# Patient Record
Sex: Female | Born: 1955 | Race: White | Hispanic: No | Marital: Married | Smoking: Former smoker
Health system: Southern US, Community
[De-identification: ages and names within clinical notes are randomized; demographics above are authoritative.]

## PROBLEM LIST (undated history)

## (undated) DIAGNOSIS — F32A Depression, unspecified: Secondary | ICD-10-CM

## (undated) DIAGNOSIS — M1990 Unspecified osteoarthritis, unspecified site: Secondary | ICD-10-CM

## (undated) DIAGNOSIS — Z8719 Personal history of other diseases of the digestive system: Secondary | ICD-10-CM

## (undated) DIAGNOSIS — R112 Nausea with vomiting, unspecified: Secondary | ICD-10-CM

## (undated) DIAGNOSIS — M797 Fibromyalgia: Secondary | ICD-10-CM

## (undated) DIAGNOSIS — H269 Unspecified cataract: Secondary | ICD-10-CM

## (undated) DIAGNOSIS — E079 Disorder of thyroid, unspecified: Secondary | ICD-10-CM

## (undated) DIAGNOSIS — Z9889 Other specified postprocedural states: Secondary | ICD-10-CM

## (undated) DIAGNOSIS — C3490 Malignant neoplasm of unspecified part of unspecified bronchus or lung: Secondary | ICD-10-CM

## (undated) DIAGNOSIS — F419 Anxiety disorder, unspecified: Secondary | ICD-10-CM

## (undated) DIAGNOSIS — E039 Hypothyroidism, unspecified: Secondary | ICD-10-CM

## (undated) DIAGNOSIS — E785 Hyperlipidemia, unspecified: Secondary | ICD-10-CM

## (undated) DIAGNOSIS — K219 Gastro-esophageal reflux disease without esophagitis: Secondary | ICD-10-CM

## (undated) DIAGNOSIS — F329 Major depressive disorder, single episode, unspecified: Secondary | ICD-10-CM

## (undated) HISTORY — DX: Unspecified osteoarthritis, unspecified site: M19.90

## (undated) HISTORY — DX: Unspecified cataract: H26.9

## (undated) HISTORY — PX: KNEE SURGERY: SHX244

## (undated) HISTORY — DX: Fibromyalgia: M79.7

## (undated) HISTORY — DX: Disorder of thyroid, unspecified: E07.9

## (undated) HISTORY — DX: Major depressive disorder, single episode, unspecified: F32.9

## (undated) HISTORY — DX: Anxiety disorder, unspecified: F41.9

## (undated) HISTORY — PX: VAGINAL HYSTERECTOMY: SUR661

## (undated) HISTORY — DX: Depression, unspecified: F32.A

## (undated) HISTORY — DX: Malignant neoplasm of unspecified part of unspecified bronchus or lung: C34.90

## (undated) HISTORY — PX: CATARACT EXTRACTION: SUR2

## (undated) HISTORY — PX: TUBAL LIGATION: SHX77

## (undated) HISTORY — DX: Hyperlipidemia, unspecified: E78.5

## (undated) HISTORY — PX: EYE SURGERY: SHX253

## (undated) HISTORY — PX: OTHER SURGICAL HISTORY: SHX169

## (undated) HISTORY — PX: COLONOSCOPY: SHX174

## (undated) HISTORY — DX: Gastro-esophageal reflux disease without esophagitis: K21.9

## (undated) HISTORY — PX: FOOT FRACTURE SURGERY: SHX645

---

## 1999-03-08 ENCOUNTER — Ambulatory Visit (HOSPITAL_COMMUNITY): Admission: RE | Admit: 1999-03-08 | Discharge: 1999-03-08 | Payer: Self-pay | Admitting: Obstetrics and Gynecology

## 1999-03-08 ENCOUNTER — Encounter (INDEPENDENT_AMBULATORY_CARE_PROVIDER_SITE_OTHER): Payer: Self-pay

## 1999-08-01 ENCOUNTER — Other Ambulatory Visit: Admission: RE | Admit: 1999-08-01 | Discharge: 1999-08-01 | Payer: Self-pay | Admitting: Obstetrics and Gynecology

## 2006-03-20 ENCOUNTER — Ambulatory Visit: Payer: Self-pay | Admitting: Gastroenterology

## 2006-04-11 ENCOUNTER — Encounter: Payer: Self-pay | Admitting: Gastroenterology

## 2006-04-11 ENCOUNTER — Ambulatory Visit: Payer: Self-pay | Admitting: Gastroenterology

## 2006-05-03 ENCOUNTER — Ambulatory Visit: Payer: Self-pay | Admitting: Gastroenterology

## 2006-05-03 ENCOUNTER — Ambulatory Visit: Payer: Self-pay | Admitting: Internal Medicine

## 2006-05-03 LAB — CONVERTED CEMR LAB
ALT: 11 units/L (ref 0–40)
AST: 13 units/L (ref 0–37)
Albumin: 3.8 g/dL (ref 3.5–5.2)
Alkaline Phosphatase: 71 units/L (ref 39–117)
BUN: 9 mg/dL (ref 6–23)
Basophils Absolute: 0.1 10*3/uL (ref 0.0–0.1)
Basophils Relative: 0.9 % (ref 0.0–1.0)
Bilirubin, Direct: 0.1 mg/dL (ref 0.0–0.3)
CO2: 30 meq/L (ref 19–32)
Calcium: 9.3 mg/dL (ref 8.4–10.5)
Chloride: 103 meq/L (ref 96–112)
Creatinine, Ser: 0.7 mg/dL (ref 0.4–1.2)
Eosinophils Absolute: 0 10*3/uL (ref 0.0–0.6)
Eosinophils Relative: 0 % (ref 0.0–5.0)
GFR calc Af Amer: 113 mL/min
GFR calc non Af Amer: 94 mL/min
Glucose, Bld: 106 mg/dL — ABNORMAL HIGH (ref 70–99)
HCT: 39.3 % (ref 36.0–46.0)
Hemoglobin: 13.8 g/dL (ref 12.0–15.0)
INR: 1.3 (ref 0.9–2.0)
Lymphocytes Relative: 19.2 % (ref 12.0–46.0)
MCHC: 35.2 g/dL (ref 30.0–36.0)
MCV: 87.8 fL (ref 78.0–100.0)
Monocytes Absolute: 0.3 10*3/uL (ref 0.2–0.7)
Monocytes Relative: 2.7 % — ABNORMAL LOW (ref 3.0–11.0)
Neutro Abs: 8.5 10*3/uL — ABNORMAL HIGH (ref 1.4–7.7)
Neutrophils Relative %: 77.2 % — ABNORMAL HIGH (ref 43.0–77.0)
Platelets: 339 10*3/uL (ref 150–400)
Potassium: 4.6 meq/L (ref 3.5–5.1)
Prothrombin Time: 13.6 s (ref 10.0–14.0)
RBC: 4.48 M/uL (ref 3.87–5.11)
RDW: 12.4 % (ref 11.5–14.6)
Sed Rate: 20 mm/hr (ref 0–25)
Sodium: 139 meq/L (ref 135–145)
TSH: 2.83 microintl units/mL (ref 0.35–5.50)
Total Bilirubin: 0.5 mg/dL (ref 0.3–1.2)
Total Protein: 6.9 g/dL (ref 6.0–8.3)
WBC: 11 10*3/uL — ABNORMAL HIGH (ref 4.5–10.5)

## 2006-05-10 ENCOUNTER — Ambulatory Visit: Payer: Self-pay | Admitting: Gastroenterology

## 2006-08-15 ENCOUNTER — Ambulatory Visit: Payer: Self-pay | Admitting: Internal Medicine

## 2006-08-20 ENCOUNTER — Ambulatory Visit: Payer: Self-pay | Admitting: Cardiology

## 2010-05-31 NOTE — Assessment & Plan Note (Signed)
[facility]                             PULMONARY OFFICE NOTE   NAME:[name], Heather LAMARCA                         MRN:          [id number]  DATE:08/15/2006                            DOB:          [birth year]    PULMONARY NEW PATIENT EVALUATION:  This is a 55 year old white female  with an incidentally-noted right middle lobe nodule on work-up for  ischemic colitis done here by Dr. Jarold Motto and referred back to her  primary physician, Dr. Leonette Monarch, for follow-up CT scan this month.  She  elected to have this followed here.  She denies any pleuritic pain,  fevers, chills, sweats, orthopnea, PND, leg swelling, unintended weight  loss, dyspnea or history of cough or hemoptysis.   PAST MEDICAL HISTORY:  1. Significant for ischemic colitis.  2. Negative for rheumatologic diseases.  3. She is status post status post hysterectomy.  4. Status post tubal ligation.  5. Has never had a diagnosis of malignancy made.   ALLERGIES:  None known.   SOCIAL HISTORY:  She is still smoking a pack per day.  Works as a  Museum/gallery exhibitions officer.   FAMILY HISTORY:  Positive for emphysema in her father and colon cancer  in her mother.  Also, leukemia in a sister.   REVIEW OF SYSTEMS:  Taken in detail on the worksheet and negative,  except as outlined above.   PHYSICAL EXAMINATION:  GENERAL:  This is a pleasant, ambulatory white  female in no acute distress.  VITAL SIGNS:  She is afebrile with stable vital signs.  HEENT:  Unremarkable.  Oropharynx clear.  LUNGS:  Lung fields completely clear bilaterally to auscultation and  percussion.  HEART:  Regular rate and rhythm without murmur, gallop, or rub.  EXTREMITIES:  Warm without calf tenderness.  No clubbing, cyanosis, or  edema.   CT scan of the abdomen was the only study available from May 03, 2006  and shows a 6 mm pulmonary nodule in the right middle lobe.  No previous  x-rays are available, and she does not think any have  ever been done.   IMPRESSION:  Asymptomatic microscopic nodule (less than 8 mm; therefore,  would not show up on a PET scan) involving the right middle lobe in this  patient who is an active smoker.  The right middle lobe distribution  favors benignancy, but I told the patient that there was no way to be  sure because we do not have baseline studies.  I therefore agreed with  doing a CT scan now and then probably again at 6 months to make sure  there is no growth in this nodule.  I did warn her that a CT scan of the  chest may show additional nodules using the concept of slices of bread  where the thinner the slice is, the more likely imperfections in the  loaf will be detected if we look hard enough.  Therefore, it is critical  that she not rely on technology to reduce her chances of lung cancer,  because there is no evidence that the early detection  is helpful  statistically, but that stopping smoking would definitely show benefit  in terms of long-term risk.   She was considering committing to quit, and therefore I gave her a  Chantix starter pack, but referred her to Dr. Leonette Monarch for refills.     Charlaine Dalton. Sherene Sires, MD, Heather [name]  Electronically Signed    MBW/MedQ  DD: 08/15/2006  DT: 08/16/2006  Job #: [id number]   cc:   Dr. Leonette Monarch

## 2010-06-03 NOTE — Assessment & Plan Note (Signed)
[facility]                         GASTROENTEROLOGY OFFICE NOTE   NAME:Heather [name], Heather [name]                         MRN:          [id number]  DATE:03/20/2006                            DOB:          11/24/1955    Mrs. Heather [name] is a 55 year old white female, self referred for evaluation  of rectal pain.   This patient does have a previous history of thrombosed hemorrhoids.  She has had lancing of hemorrhoids, also has recurrent rectal fissures.  Over the last 6 months, she has had periodic, severe pain in her rectum  that she describes as a throbbing pain, high up in her rectum.  There  has been no real serious rectal bleeding, but she does occasionally see  some bright red blood.  She has a self-diagnosed irritable bowel  syndrome of alternating diarrhea and constipation, and does use a lot of  sorbitol in her diet in the form of chewing gum.  She denies upper GI or  hepatobiliary complaints.  She did have flexible sigmoidoscopy in 1989;  this report is not available.  She, otherwise, follows a regular diet,  and denies any specific food intolerances, and has had no anorexia or  weight loss.   PAST MEDICAL HISTORY:  Remarkable for depression and chronic thyroid  dysfunction.  She is currently on Effexor XR 75 mg a day, L-thyroxin 100  mg a day, and Premarin 0.625 mg a day.   Denies allergies.   SURGICAL PROCEDURES:  The patient has had a hysterectomy and tubal  ligation.   FAMILY HISTORY:  Remarkable for mother dying at age 56 of colon cancer.  She has a brother with inflammatory bowel disease.   SOCIAL HISTORY:  Patient is married and lives with her husband.  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  She smokes at least  a pack of cigarettes a day, but denies ethanol abuse.   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  She denies any current  cardiovascular, pulmonary, or neurological problems.   PHYSICAL EXAMINATION:  Shows her to be healthy-appearing white female in  no acute distress, appearing her stated age.  She is 5 feet 4 inches tall and weighs 188 pounds.  Blood pressure is  146/82 and pulse is 68 and regular.  I could not appreciate stigmata of chronic liver disease.  Her chest was clear.  She appeared to be a in regular rhythm without murmurs, gallops, or  rubs.  I could not appreciate hepatosplenomegaly, abdominal masses or  tenderness.  Bowel sounds were normal.  Her extremities were unremarkable.  MENTAL STATUS:  Clear.  RECTAL EXAM:  Showed a rather deep posterior fissure without any  definite hemorrhoids.  Rectal exam showed no masses or tenderness with  soft stool which is guaiac negative.   ASSESSMENT:  1. 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  2. Chronic rectal fissure.  3. Family history of colon carcinoma and inflammatory bowel disease.  4. History chronic thyroid dysfunction.  5. History of chronic anxiety, depression, and panic disorders.      Patient is on Effexor therapy.   RECOMMENDATIONS:  1. High fiber diet with daily Benefiber and liberal p.o. fluids.  2. Twice a day sitz bath with diltiazem gel locally.  3. Sublingual NuLev p.r.n. as needed for rectal spasms.  4. Outpatient colonoscopy in several weeks' time.  Check screening      laboratory parameters.  5. Consider surgical referral to Dr. Kendrick Ranch.     Vania Rea. Jarold Motto, MD, Caleen Essex, FAGA  Electronically Signed    DRP/MedQ  DD: 03/20/2006  DT: 03/20/2006  Job #: [id number]   cc:   Sheppard Plumber. Earlene Plater, M.D.

## 2010-06-03 NOTE — Op Note (Signed)
[facility]  Patient:    Heather [name], Heather [name]                         MRN: [id number] Proc. Date: 03/08/99 Adm. Date:  [id number] Attending:  Amanda Cockayne                           Operative Report  PREOPERATIVE DIAGNOSIS:       Prolonged irregular uterine bleeding, heavy x 4 days with clots.  POSTOPERATIVE DIAGNOSIS:      Prolonged irregular uterine bleeding, heavy x 4 days with clots.  Endometrial polyp.  OPERATION:  SURGEON:                      Esmeralda Arthur, M.D.  ASSISTANT:  ANESTHESIA:                   General anesthesia.  MEDIUM:                       Sorbitol, deficit 40 cc.  PACKS:                        None.  CATHETERS:                    None.  FINDINGS:                     The uterus felt slightly enlarged bimanual.  ESTIMATED BLOOD LOSS:  DESCRIPTION OF PROCEDURE:     The cervix and uterus sounded to 9 cm.  The cervix was then dilated to a #23.  I could not get the observation scope in.  I dilated her to a #25 with the Hegars.  The scope was inserted and there was a lot of clotted blood in the uterine cavity.  It looked like a polyp anteriorly and questionable polyp to the lateral wall.  I did not see anything new resecting and we explored with the Randall stonegrasping forceps.  A small amount of tissue was obtained.  We then curetted.  I looked again and I think we removed what we saw. The procedure was terminated.  The patient was carried to the recovery room in ood condition.  It should be noted that the patient was prepped and draped in a sterile field and the bladder was catheterized. DD:  03/08/99 TD:  03/08/99 Job: [id number]

## 2010-06-03 NOTE — Assessment & Plan Note (Signed)
HEALTHCARE                         GASTROENTEROLOGY OFFICE NOTE   NAME:Sieben, [name]                         MRN:          [id number]  DATE:05/03/2006                            DOB:          16-Dec-1955    PROBLEM:  Acute abdominal pain and rectal bleeding.   HISTORY:  Heather [name] is a pleasant 55 year old white female who was seen  earlier today by Dr. Jarold Motto with complaints of onset of acute  abdominal pain, which woke her from sleep on Tuesday May 01, 2006.  This was associated with diaphoresis, then diarrhea, followed by rectal  blood.  She also had dry heaves.  She had not had any fevers or chills  since.  She continued to pass some blood per rectum for the remainder of  Tuesday and has had persistent pain ever since.  She says she also is  having a lot of urgency.  She did pass some bloody stools yesterday and  has not passed any blood today.  She generally feels poorly and has not  eaten.  She was set up for CT scan of her abdomen and pelvis to be done  stat today, and this has returned this afternoon.  The patient was  brought back to the office from radiology to discuss findings.  She was  found to have a segmental colitis of the descending colon from the  splenic flexure to the sigmoid colon with some pericolonic fat  stranding.  This was felt to be consistent with an ischemic colitis.  She was also noted to have a 6.3 mm pulmonary nodule in the right middle  lobe.  Her labs from this morning show a  WBC of 11, hemoglobin 13.8,  hematocrit 39.3, Pro Time 13.6, INR 1.3, sedimentation rate 20.  Electrolytes were within normal limits.  Liver function studies also  normal.   EXAM:  Well-developed white female uncomfortable in no acute distress.  ABDOMEN:  Soft.  She is tender along the left abdomen, and particularly  along the left lower quadrant.  There is guarding but no rebound.  Bowel  sounds are active.   IMPRESSION:  55. A 55 year old  white female with acute segmental ischemic colitis.      Her primary risk factor is Premarin, which may ultimately need to      be discontinued.  She is hemodynamically stable and feels that she      can manage her pain at home.  We will ask her to rest at home over      the next few days.  She is given the note to stay out of work      through May 08, 2006.  Clear liquid diet over the next 24 hours      and then progress to full liquids and then soft thereafter as she      tolerates.  She is to push fluids.  2. Vicodin 5/500 one every 4 to 6 hours as needed for pain.  3. Continued Librax 1 p.o. t.i.d. to q.i.d.  4. Phenergan 25 mg q.6h p.r.n. nausea.  5. Follow up  with Dr. Jarold Motto in 1 week as scheduled.  6. The patient is advised to call back tomorrow and, if she does not      feel that she is improving      or feels that she cannot manage at home or feels that she is      deteriorating, may require admission to the hospital for supportive      management.      Mike Gip, PA-C  Electronically Signed      Vania Rea. Jarold Motto, MD, Caleen Essex, FAGA  Electronically Signed   AE/MedQ  DD: 05/03/2006  DT: 05/04/2006  Job #: [id number]

## 2010-06-03 NOTE — Assessment & Plan Note (Signed)
[facility]                         GASTROENTEROLOGY OFFICE NOTE   NAME:Heather [name]                         MRN:          [id number]  DATE:05/10/2006                            DOB:          10-18-1955    PROBLEM:  Followup segmental ischemic colitis.   HISTORY:  Heather [name] comes in today 1 week after her diagnosis of segmental  ischemic colitis. She says she is feeling much better, continues to  experience some mild lower abnormal cramping but has not been having any  ongoing pain and is not requiring any further pain medications. She is  eating a bit better but says her appetite is still off. She has not had  any nausea or vomiting. No further diarrhea or hematochezia. She has  been using the Librax 3 to 4 times daily which she definitely feels is  helping. She has been out of work and plans to return to work next week.   CURRENT MEDICATIONS:  1. Effexor XR 75 mg daily.  2. Premarin 0.625 p.o. daily.  3. Levothyroxine 100 mcg daily.  4. Librax t.i.d.   ALLERGIES:  No known drug allergies.   PHYSICAL EXAMINATION:  Well-developed, white female in no acute  distress. Weight is 183, blood pressure 140/8, pulse is 72.  CARDIOVASCULAR: Regular rate and rhythm with S1, and S2.  PULMONARY: Clear to auscultation and percussion.  ABDOMEN: Soft, bowel sounds are active. She is mildly tender in the left  lower quadrant. There is no guarding or rebound.   IMPRESSION:  1. Resolving segmental ischemic colitis, it is felt that this maybe      associated with her estrogen therapy in the form of Premarin. This      has is a known associated risk factor.  2. Proctalgia, symptomatically improved with Librax.  3. Incidental pulmonary nodule found on CT scan in the right middle      lobe.   PLAN:  1. Continue Librax, have asked her to try to decrease dose to daily to      b.i.d. over the next week or so and that she may want to continue      longterm on daily  dosing for the proctalgia symptoms.  2. Return to work May 14, 2006.  3. Patient was asked to follow up with her primary care physician Dr.      Leonette Monarch regarding follow up CT of the chest to be done in      approximately 3 months to assure stability of the small right      middle lobe pulmonary nodule. She understands and will get an      appointment with him.  4. Follow up with gynecology, Dr.Mcleod regarding switching from      Premarin to an estrogen patch which should have less potential risk      in this setting.      Mike Gip, PA-C  Electronically Signed      Vania Rea. Jarold Motto, MD, Caleen Essex, FAGA  Electronically Signed   AE/MedQ  DD: 05/10/2006  DT: 05/10/2006  Job #: [id number]   cc:  Clint Bolder, MD

## 2010-06-03 NOTE — Assessment & Plan Note (Signed)
[facility]                         GASTROENTEROLOGY OFFICE NOTE   PATRIC, VANPELT                         MRN:          [id number]  DATE:05/03/2006                            DOB:          1955-10-11    Heather [name] is a 55 year old white female who I saw on March 20, 2006  because of rectal pain and periodic rectal bleeding.  She underwent  colonoscopy on April 11, 2006 with a normal exam, and was felt to have  proctalgia fugax.  She was placed on Librax 3 times a day with Xyralad  cream locally.  She was doing well until the last 48 hours when she  developed rather marked suprapubic cramping abdominal pain followed by  bloody diarrhea.  She has had no nausea, vomiting, fevers, or chills.   She does take:  1. Estrogen compounds in the form of Premarin 0.625 mg a day.  2. Effexor 75 mg a day.  3. Levothyroxine 100 mg a day.  4. She has recently been using canasta suppositories for the last 2      days.   She denies any over-the-counter drug use or use of decongestants.  She  has never had similar pain or problems.  She denies infectious disease  exposure or recent antibiotic use.  Biopsies from a recent colonoscopy  showed a small hyperplastic polyp.   She is a somewhat uncomfortable-appearing white female, in no acute  distress, appearing her stated age.  She weighs 183 pounds.  Blood pressure is 136/90.  Pulse was 80 and  regular.  ABDOMEN:  Flat and not distended.  There was no organomegaly, but there  was tenderness in the suprapubic area without rebound.  Bowel sounds  were diminished.  Inspection of rectal was unremarkable.  Rectal exam showed soft bloody  stool in the rectal vault without fresh Hemoccult.   ASSESSMENT:  It seems that Ms. Melland most likely has had an episode of  acute ischemic colitis, etiology unclear, perhaps related to her chronic  estrogen compound use.   RECOMMENDATIONS:  1. Check CBC, sed rate, and metabolic  profile.  2. Check abdominal/pelvic CT scan.  3. Low-fiber diet with continued Librax use, and p.r.n. Darvocet 100.  4. Empirically placed on Cipro 500 mg twice a day.  5. Followup and treatment dependent on her labs, CT scan, and clinical      course.     Vania Rea. Jarold Motto, MD, Caleen Essex, FAGA  Electronically Signed    DRP/MedQ  DD: 05/03/2006  DT: 05/03/2006  Job #: [id number]

## 2012-04-29 ENCOUNTER — Encounter: Payer: Self-pay | Admitting: Gastroenterology

## 2012-05-20 ENCOUNTER — Ambulatory Visit (AMBULATORY_SURGERY_CENTER): Payer: PRIVATE HEALTH INSURANCE | Admitting: *Deleted

## 2012-05-20 VITALS — Ht 64.0 in | Wt 178.8 lb

## 2012-05-20 DIAGNOSIS — Z800 Family history of malignant neoplasm of digestive organs: Secondary | ICD-10-CM

## 2012-05-20 DIAGNOSIS — Z1211 Encounter for screening for malignant neoplasm of colon: Secondary | ICD-10-CM

## 2012-05-20 MED ORDER — PEG-KCL-NACL-NASULF-NA ASC-C 100 G PO SOLR: ORAL | Status: DC

## 2012-05-20 NOTE — Progress Notes (Signed)
No egg or soy allergy 

## 2012-05-31 ENCOUNTER — Encounter: Payer: Self-pay | Admitting: Gastroenterology

## 2012-06-03 ENCOUNTER — Encounter: Payer: Self-pay | Admitting: Gastroenterology

## 2012-06-03 ENCOUNTER — Ambulatory Visit (AMBULATORY_SURGERY_CENTER): Payer: PRIVATE HEALTH INSURANCE | Admitting: Gastroenterology

## 2012-06-03 VITALS — BP 125/60 | HR 69 | Temp 97.9°F | Resp 18

## 2012-06-03 DIAGNOSIS — K5730 Diverticulosis of large intestine without perforation or abscess without bleeding: Secondary | ICD-10-CM

## 2012-06-03 DIAGNOSIS — Z800 Family history of malignant neoplasm of digestive organs: Secondary | ICD-10-CM

## 2012-06-03 DIAGNOSIS — D126 Benign neoplasm of colon, unspecified: Secondary | ICD-10-CM

## 2012-06-03 DIAGNOSIS — Z1211 Encounter for screening for malignant neoplasm of colon: Secondary | ICD-10-CM

## 2012-06-03 MED ORDER — SODIUM CHLORIDE 0.9 % IV SOLN: 500.0000 mL | INTRAVENOUS | Status: DC

## 2012-06-03 NOTE — Progress Notes (Signed)
Called to room to assist during endoscopic procedure.  Patient ID and intended procedure confirmed with present staff. Received instructions for my participation in the procedure from the performing physician.  

## 2012-06-03 NOTE — Patient Instructions (Signed)
YOU HAD AN ENDOSCOPIC PROCEDURE TODAY AT THE [facility]: Refer to the procedure report that was given to you for any specific questions about what was found during the examination.  If the procedure report does not answer your questions, please call your gastroenterologist to clarify.  If you requested that your care partner not be given the details of your procedure findings, then the procedure report has been included in a sealed envelope for you to review at your convenience later.  YOU SHOULD EXPECT: Some feelings of bloating in the abdomen. Passage of more gas than usual.  Walking can help get rid of the air that was put into your GI tract during the procedure and reduce the bloating. If you had a lower endoscopy (such as a colonoscopy or flexible sigmoidoscopy) you may notice spotting of blood in your stool or on the toilet paper. If you underwent a bowel prep for your procedure, then you may not have a normal bowel movement for a few days.  DIET: Your first meal following the procedure should be a Nez meal and then it is ok to progress to your normal diet.  A half-sandwich or bowl of soup is an example of a good first meal.  Heavy or fried foods are harder to digest and may make you feel nauseous or bloated.  Likewise meals heavy in dairy and vegetables can cause extra gas to form and this can also increase the bloating.  Drink plenty of fluids but you should avoid alcoholic beverages for 24 hours.  ACTIVITY: Your care partner should take you home directly after the procedure.  You should plan to take it easy, moving slowly for the rest of the day.  You can resume normal activity the day after the procedure however you should NOT DRIVE or use heavy machinery for 24 hours (because of the sedation medicines used during the test).    SYMPTOMS TO REPORT IMMEDIATELY: A gastroenterologist can be reached at any hour.  During normal business hours, 8:30 AM to 5:00 PM Monday through Friday,  call [phone].  After hours and on weekends, please call the GI answering service at [phone] who will take a message and have the physician on call contact you.   Following lower endoscopy (colonoscopy or flexible sigmoidoscopy):  Excessive amounts of blood in the stool  Significant tenderness or worsening of abdominal pains  Swelling of the abdomen that is new, acute  Fever of 100F or higher    FOLLOW UP: If any biopsies were taken you will be contacted by phone or by letter within the next 1-3 weeks.  Call your gastroenterologist if you have not heard about the biopsies in 3 weeks.  Our staff will call the home number listed on your records the next business day following your procedure to check on you and address any questions or concerns that you may have at that time regarding the information given to you following your procedure. This is a courtesy call and so if there is no answer at the home number and we have not heard from you through the emergency physician on call, we will assume that you have returned to your regular daily activities without incident.  SIGNATURES/CONFIDENTIALITY: You and/or your care partner have signed paperwork which will be entered into your electronic medical record.  These signatures attest to the fact that that the information above on your After Visit Summary has been reviewed and is understood.  Full responsibility of the confidentiality   of this discharge information lies with you and/or your care-partner.     

## 2012-06-03 NOTE — Op Note (Signed)
[facility] [address] [place], [address]   COLONOSCOPY PROCEDURE REPORT  PATIENT: Sejal, Cofield  MR#: [id number] BIRTHDATE: 1956-01-06 , 56  yrs. old GENDER: Female ENDOSCOPIST: Mardella Layman, MD, [facility] REFERRED BY: PROCEDURE DATE:  06/03/2012 PROCEDURE:   Colonoscopy with snare polypectomy ASA CLASS:   Class II INDICATIONS:Patient's immediate family history of colon cancer and Patient's personal history of adenomatous colon polyps. MEDICATIONS: Propofol (Diprivan) 240 mg IV  DESCRIPTION OF PROCEDURE:   After the risks and benefits and of the procedure were explained, informed consent was obtained.  A digital rectal exam revealed no abnormalities of the rectum.    The LB [id number]  endoscope was introduced through the anus and advanced to the cecum, which was identified by both the appendix and ileocecal valve .  The quality of the prep was good, using MoviPrep .  The instrument was then slowly withdrawn as the colon was fully examined.     COLON FINDINGS: Two smooth sessile polyps ranging between 3-61mm in size were found at the cecum.  A polypectomy was performed with a cold snare.  The resection was complete and the polyp tissue was completely retrieved.   Three smooth sessile polyps ranging between 3-57mm in size were found in the descending colon and sigmoid colon. A polypectomy was performed using snare cautery.  The resection was complete and the polyp tissue was completely retrieved.   There was moderate diverticulosis noted in the sigmoid colon with associated muscular hypertrophy and luminal narrowing. Retroflexed views revealed no abnormalities.     The scope was then withdrawn from the patient and the procedure completed.  COMPLICATIONS: There were no complications. ENDOSCOPIC IMPRESSION: 1.   Two sessile polyps ranging between 3-67mm in size were found at the cecum; polypectomy was performed with a cold snare 2.   Three sessile  polyps ranging between 3-40mm in size were found in the descending colon and sigmoid colon; polypectomy was performed using snare cautery 3.   There was moderate diverticulosis noted in the sigmoid colon  RECOMMENDATIONS: 1.  Await pathology results 2.  High fiber diet 3.  Repeat Colonoscopy in 3 years.++ FH colon cancer noted   REPEAT EXAM:  cc:  _______________________________ eSignedMardella Layman, MD, [facility] 06/03/2012 11:33 AM     PATIENT NAME:  Lars Pinks MR#: [id number]

## 2012-06-03 NOTE — Progress Notes (Signed)
Patient did not experience any of the following events: a burn prior to discharge; a fall within the facility; wrong site/side/patient/procedure/implant event; or a hospital transfer or hospital admission upon discharge from the facility. (G8907) Patient did not have preoperative order for IV antibiotic SSI prophylaxis. (G8918)  

## 2012-06-04 ENCOUNTER — Telehealth: Payer: Self-pay

## 2012-06-04 NOTE — Telephone Encounter (Signed)
  Follow up Call-  Call back number 06/03/2012  Post procedure Call Back phone  # [phone] hm  Permission to leave phone message Yes     Patient questions:  Do you have a fever, pain , or abdominal swelling? no Pain Score  0 *  Have you tolerated food without any problems? yes  Have you been able to return to your normal activities? yes  Do you have any questions about your discharge instructions: Diet   no Medications  no Follow up visit  no  Do you have questions or concerns about your Care? no  Actions: * If pain score is 4 or above: No action needed, pain <4.

## 2012-06-07 ENCOUNTER — Encounter: Payer: Self-pay | Admitting: Gastroenterology

## 2012-07-07 ENCOUNTER — Other Ambulatory Visit: Payer: Self-pay | Admitting: Nurse Practitioner

## 2012-08-10 ENCOUNTER — Other Ambulatory Visit: Payer: Self-pay | Admitting: Nurse Practitioner

## 2012-08-13 NOTE — Telephone Encounter (Signed)
Last seen 11/13.

## 2012-09-03 ENCOUNTER — Encounter: Payer: Self-pay | Admitting: Family Medicine

## 2012-09-03 ENCOUNTER — Telehealth: Payer: Self-pay | Admitting: Nurse Practitioner

## 2012-09-03 ENCOUNTER — Ambulatory Visit (INDEPENDENT_AMBULATORY_CARE_PROVIDER_SITE_OTHER): Payer: PRIVATE HEALTH INSURANCE | Admitting: Family Medicine

## 2012-09-03 VITALS — BP 133/67 | HR 78 | Temp 98.1°F | Wt 180.8 lb

## 2012-09-03 DIAGNOSIS — A084 Viral intestinal infection, unspecified: Secondary | ICD-10-CM

## 2012-09-03 DIAGNOSIS — F329 Major depressive disorder, single episode, unspecified: Secondary | ICD-10-CM | POA: Insufficient documentation

## 2012-09-03 DIAGNOSIS — E039 Hypothyroidism, unspecified: Secondary | ICD-10-CM | POA: Insufficient documentation

## 2012-09-03 DIAGNOSIS — E785 Hyperlipidemia, unspecified: Secondary | ICD-10-CM

## 2012-09-03 DIAGNOSIS — R110 Nausea: Secondary | ICD-10-CM

## 2012-09-03 DIAGNOSIS — A088 Other specified intestinal infections: Secondary | ICD-10-CM

## 2012-09-03 DIAGNOSIS — F32A Depression, unspecified: Secondary | ICD-10-CM | POA: Insufficient documentation

## 2012-09-03 NOTE — Progress Notes (Signed)
Subjective:    Patient ID: Heather [name], female    DOB: May 06, 1955, 57 y.o.   MRN: [id number]  HPI    Review of Systems  Constitutional: Negative for chills and fatigue.  HENT: Negative for ear pain, congestion, postnasal drip and sinus pressure.   Eyes: Negative.   Respiratory: Negative.   Cardiovascular: Negative.   Gastrointestinal: Positive for nausea and diarrhea. Negative for vomiting and abdominal pain.  Genitourinary: Negative.   Musculoskeletal: Negative.   Skin: Negative.   Neurological: Positive for dizziness and headaches.  Psychiatric/Behavioral: Negative.        Objective:   Physical Exam  Nursing note and vitals reviewed. Constitutional: She is oriented to person, place, and time. She appears well-developed and well-nourished. No distress.  HENT:  Head: Normocephalic.  Eyes: Conjunctivae are normal. Right eye exhibits no discharge. Left eye exhibits no discharge. No scleral icterus.  Neck: Normal range of motion.  Cardiovascular: Normal rate, regular rhythm and normal heart sounds.  Exam reveals no friction rub.   No murmur heard. Pulmonary/Chest: Effort normal and breath sounds normal. No respiratory distress. She has no wheezes. She has no rales.  Abdominal: Soft. She exhibits no distension. There is no tenderness. There is no rebound and no guarding.  Bowel sounds increased  Musculoskeletal: Normal range of motion.  Neurological: She is alert and oriented to person, place, and time.  Skin: Skin is warm and dry. No rash noted. She is not diaphoretic.  Psychiatric: She has a normal mood and affect. Her behavior is normal. Judgment and thought content normal.          Assessment & Plan:  1. Hyperlipidemia - CMP14+EGFR - NMR, lipoprofile  2. Depression  3. Hypothyroidism  4. Viral gastroenteritis  Patient Instructions  Schedule follow up appt with Bennie Pierini   Viral Gastroenteritis Viral gastroenteritis is also known as stomach  flu. This condition affects the stomach and intestinal tract. It can cause sudden diarrhea and vomiting. The illness typically lasts 3 to 8 days. Most people develop an immune response that eventually gets rid of the virus. While this natural response develops, the virus can make you quite ill. CAUSES  Many different viruses can cause gastroenteritis, such as rotavirus or noroviruses. You can catch one of these viruses by consuming contaminated food or water. You may also catch a virus by sharing utensils or other personal items with an infected person or by touching a contaminated surface. SYMPTOMS  The most common symptoms are diarrhea and vomiting. These problems can cause a severe loss of body fluids (dehydration) and a body salt (electrolyte) imbalance. Other symptoms may include:  Fever.  Headache.  Fatigue.  Abdominal pain. DIAGNOSIS  Your caregiver can usually diagnose viral gastroenteritis based on your symptoms and a physical exam. A stool sample may also be taken to test for the presence of viruses or other infections. TREATMENT  This illness typically goes away on its own. Treatments are aimed at rehydration. The most serious cases of viral gastroenteritis involve vomiting so severely that you are not able to keep fluids down. In these cases, fluids must be given through an intravenous line (IV). HOME CARE INSTRUCTIONS   Drink enough fluids to keep your urine clear or pale yellow. Drink small amounts of fluids frequently and increase the amounts as tolerated.  Ask your caregiver for specific rehydration instructions.  Avoid:  Foods high in sugar.  Alcohol.  Carbonated drinks.  Tobacco.  Juice.  Caffeine drinks.  Extremely  hot or cold fluids.  Fatty, greasy foods.  Too much intake of anything at one time.  Dairy products until 24 to 48 hours after diarrhea stops.  You may consume probiotics. Probiotics are active cultures of beneficial bacteria. They may lessen  the amount and number of diarrheal stools in adults. Probiotics can be found in yogurt with active cultures and in supplements.  Wash your hands well to avoid spreading the virus.  Only take over-the-counter or prescription medicines for pain, discomfort, or fever as directed by your caregiver. Do not give aspirin to children. Antidiarrheal medicines are not recommended.  Ask your caregiver if you should continue to take your regular prescribed and over-the-counter medicines.  Keep all follow-up appointments as directed by your caregiver. SEEK IMMEDIATE MEDICAL CARE IF:   You are unable to keep fluids down.  You do not urinate at least once every 6 to 8 hours.  You develop shortness of breath.  You notice blood in your stool or vomit. This may look like coffee grounds.  You have abdominal pain that increases or is concentrated in one small area (localized).  You have persistent vomiting or diarrhea.  You have a fever.  The patient is a child younger than 3 months, and he or she has a fever.  The patient is a child older than 3 months, and he or she has a fever and persistent symptoms.  The patient is a child older than 3 months, and he or she has a fever and symptoms suddenly get worse.  The patient is a baby, and he or she has no tears when crying. MAKE SURE YOU:   Understand these instructions.  Will watch your condition.  Will get help right away if you are not doing well or get worse. Document Released: 01/02/2005 Document Revised: 03/27/2011 Document Reviewed: 10/19/2010 [facility] Patient Information 2014 [place], [place].    Nyra Capes MD

## 2012-09-03 NOTE — Patient Instructions (Addendum)
Schedule follow up appt with Heather [name]   Viral Gastroenteritis Viral gastroenteritis is also known as stomach flu. This condition affects the stomach and intestinal tract. It can cause sudden diarrhea and vomiting. The illness typically lasts 3 to 8 days. Most people develop an immune response that eventually gets rid of the virus. While this natural response develops, the virus can make you quite ill. CAUSES  Many different viruses can cause gastroenteritis, such as rotavirus or noroviruses. You can catch one of these viruses by consuming contaminated food or water. You may also catch a virus by sharing utensils or other personal items with an infected person or by touching a contaminated surface. SYMPTOMS  The most common symptoms are diarrhea and vomiting. These problems can cause a severe loss of body fluids (dehydration) and a body salt (electrolyte) imbalance. Other symptoms may include:  Fever.  Headache.  Fatigue.  Abdominal pain. DIAGNOSIS  Your caregiver can usually diagnose viral gastroenteritis based on your symptoms and a physical exam. A stool sample may also be taken to test for the presence of viruses or other infections. TREATMENT  This illness typically goes away on its own. Treatments are aimed at rehydration. The most serious cases of viral gastroenteritis involve vomiting so severely that you are not able to keep fluids down. In these cases, fluids must be given through an intravenous line (IV). HOME CARE INSTRUCTIONS   Drink enough fluids to keep your urine clear or pale yellow. Drink small amounts of fluids frequently and increase the amounts as tolerated.  Ask your caregiver for specific rehydration instructions.  Avoid:  Foods high in sugar.  Alcohol.  Carbonated drinks.  Tobacco.  Juice.  Caffeine drinks.  Extremely hot or cold fluids.  Fatty, greasy foods.  Too much intake of anything at one time.  Dairy products until 24 to 48  hours after diarrhea stops.  You may consume probiotics. Probiotics are active cultures of beneficial bacteria. They may lessen the amount and number of diarrheal stools in adults. Probiotics can be found in yogurt with active cultures and in supplements.  Wash your hands well to avoid spreading the virus.  Only take over-the-counter or prescription medicines for pain, discomfort, or fever as directed by your caregiver. Do not give aspirin to children. Antidiarrheal medicines are not recommended.  Ask your caregiver if you should continue to take your regular prescribed and over-the-counter medicines.  Keep all follow-up appointments as directed by your caregiver. SEEK IMMEDIATE MEDICAL CARE IF:   You are unable to keep fluids down.  You do not urinate at least once every 6 to 8 hours.  You develop shortness of breath.  You notice blood in your stool or vomit. This may look like coffee grounds.  You have abdominal pain that increases or is concentrated in one small area (localized).  You have persistent vomiting or diarrhea.  You have a fever.  The patient is a child younger than 3 months, and he or she has a fever.  The patient is a child older than 3 months, and he or she has a fever and persistent symptoms.  The patient is a child older than 3 months, and he or she has a fever and symptoms suddenly get worse.  The patient is a baby, and he or she has no tears when crying. MAKE SURE YOU:   Understand these instructions.  Will watch your condition.  Will get help right away if you are not doing well or get  worse. Document Released: 01/02/2005 Document Revised: 03/27/2011 Document Reviewed: 10/19/2010 [facility] Patient Information 2014 [place], [place].

## 2012-09-03 NOTE — Telephone Encounter (Signed)
appt made

## 2012-09-04 LAB — CMP14+EGFR
ALT: 10 IU/L (ref 0–32)
AST: 16 IU/L (ref 0–40)
Albumin/Globulin Ratio: 1.8 (ref 1.1–2.5)
Albumin: 4.3 g/dL (ref 3.5–5.5)
Alkaline Phosphatase: 90 IU/L (ref 39–117)
BUN/Creatinine Ratio: 22 (ref 9–23)
BUN: 13 mg/dL (ref 6–24)
CO2: 22 mmol/L (ref 18–29)
Calcium: 9.6 mg/dL (ref 8.7–10.2)
Chloride: 101 mmol/L (ref 97–108)
Creatinine, Ser: 0.59 mg/dL (ref 0.57–1.00)
GFR calc Af Amer: 118 mL/min/{1.73_m2} (ref >59)
GFR calc non Af Amer: 102 mL/min/{1.73_m2} (ref >59)
Globulin, Total: 2.4 g/dL (ref 1.5–4.5)
Glucose: 82 mg/dL (ref 65–99)
Potassium: 4.4 mmol/L (ref 3.5–5.2)
Sodium: 136 mmol/L (ref 134–144)
Total Bilirubin: 0.3 mg/dL (ref 0.0–1.2)
Total Protein: 6.7 g/dL (ref 6.0–8.5)

## 2012-09-04 LAB — NMR, LIPOPROFILE
Cholesterol: 255 mg/dL — ABNORMAL HIGH (ref <200)
HDL Cholesterol by NMR: 43 mg/dL (ref >=40)
HDL Particle Number: 25.4 umol/L — ABNORMAL LOW (ref >=30.5)
LDL Particle Number: 2095 nmol/L — ABNORMAL HIGH (ref <1000)
LDL Size: 20.2 nm — ABNORMAL LOW (ref >20.5)
LDLC SERPL CALC-MCNC: 167 mg/dL — ABNORMAL HIGH (ref <100)
LP-IR Score: 72 — ABNORMAL HIGH (ref <=45)
Small LDL Particle Number: 1242 nmol/L — ABNORMAL HIGH (ref <=527)
Triglycerides by NMR: 227 mg/dL — ABNORMAL HIGH (ref <150)

## 2012-09-14 ENCOUNTER — Other Ambulatory Visit: Payer: Self-pay | Admitting: Nurse Practitioner

## 2012-09-19 ENCOUNTER — Other Ambulatory Visit: Payer: Self-pay | Admitting: Nurse Practitioner

## 2012-09-19 MED ORDER — SIMVASTATIN 40 MG PO TABS: 40.0000 mg | ORAL_TABLET | Freq: Every day | ORAL | Status: DC

## 2012-09-27 ENCOUNTER — Ambulatory Visit (INDEPENDENT_AMBULATORY_CARE_PROVIDER_SITE_OTHER): Payer: PRIVATE HEALTH INSURANCE | Admitting: Nurse Practitioner

## 2012-09-27 ENCOUNTER — Encounter: Payer: Self-pay | Admitting: Nurse Practitioner

## 2012-09-27 VITALS — BP 130/72 | HR 84 | Temp 97.8°F | Ht 63.0 in | Wt 182.0 lb

## 2012-09-27 DIAGNOSIS — F329 Major depressive disorder, single episode, unspecified: Secondary | ICD-10-CM

## 2012-09-27 DIAGNOSIS — K219 Gastro-esophageal reflux disease without esophagitis: Secondary | ICD-10-CM | POA: Insufficient documentation

## 2012-09-27 DIAGNOSIS — E785 Hyperlipidemia, unspecified: Secondary | ICD-10-CM

## 2012-09-27 DIAGNOSIS — E039 Hypothyroidism, unspecified: Secondary | ICD-10-CM

## 2012-09-27 DIAGNOSIS — F32A Depression, unspecified: Secondary | ICD-10-CM

## 2012-09-27 MED ORDER — LEVOTHYROXINE SODIUM 112 MCG PO TABS: 112.0000 ug | ORAL_TABLET | Freq: Every day | ORAL | Status: DC

## 2012-09-27 MED ORDER — OMEPRAZOLE 40 MG PO CPDR: 40.0000 mg | DELAYED_RELEASE_CAPSULE | Freq: Every day | ORAL | Status: DC

## 2012-09-27 MED ORDER — SIMVASTATIN 40 MG PO TABS: 40.0000 mg | ORAL_TABLET | Freq: Every day | ORAL | Status: DC

## 2012-09-27 MED ORDER — FLUOXETINE HCL 40 MG PO CAPS: 40.0000 mg | ORAL_CAPSULE | Freq: Every day | ORAL | Status: DC

## 2012-09-27 NOTE — Patient Instructions (Addendum)

## 2012-09-27 NOTE — Progress Notes (Signed)
Subjective:    Patient ID: Heather [name], female    DOB: 24-Jul-1955, 57 y.o.   MRN: [id number]  Hyperlipidemia This is a chronic problem. The current episode started more than 1 year ago. The problem is uncontrolled. Recent lipid tests were reviewed and are high. Exacerbating diseases include hypothyroidism. She has no history of diabetes or obesity. Factors aggravating her hyperlipidemia include smoking and fatty foods. Pertinent negatives include no chest pain, focal sensory loss, focal weakness, leg pain, myalgias or shortness of breath. Current antihyperlipidemic treatment includes statins (just started on statin 1 week ago). The current treatment provides moderate improvement of lipids. Risk factors for coronary artery disease include family history and post-menopausal.  Thyroid Problem Presents for follow-up (hypothyroidism) visit. Patient reports no anxiety, cold intolerance, constipation, diaphoresis, dry skin, hair loss, heat intolerance, hoarse voice, menstrual problem, nail problem, palpitations, visual change, weight gain or weight loss. Her past medical history is significant for hyperlipidemia. There is no history of diabetes.  GERD prilosec working well for symptoms Depression prozac- working okay- could be higher but doesn't want to increase  Review of Systems  Constitutional: Negative for weight loss, weight gain and diaphoresis.  HENT: Negative for hoarse voice.   Respiratory: Negative for shortness of breath.   Cardiovascular: Negative for chest pain and palpitations.  Gastrointestinal: Negative for constipation.  Endocrine: Negative for cold intolerance and heat intolerance.  Genitourinary: Negative for menstrual problem.  Musculoskeletal: Negative for myalgias.  Neurological: Negative for focal weakness.  All other systems reviewed and are negative.       Objective:   Physical Exam  Constitutional: She is oriented to person, place, and time. She appears  well-developed and well-nourished.  HENT:  Nose: Nose normal.  Mouth/Throat: Oropharynx is clear and moist.  Eyes: EOM are normal.  Neck: Trachea normal, normal range of motion and full passive range of motion without pain. Neck supple. No JVD present. Carotid bruit is not present. No thyromegaly present.  Cardiovascular: Normal rate, regular rhythm, normal heart sounds and intact distal pulses.  Exam reveals no gallop and no friction rub.   No murmur heard. Pulmonary/Chest: Effort normal and breath sounds normal.  Abdominal: Soft. Bowel sounds are normal. She exhibits no distension and no mass. There is no tenderness.  Musculoskeletal: Normal range of motion.  Lymphadenopathy:    She has no cervical adenopathy.  Neurological: She is alert and oriented to person, place, and time. She has normal reflexes.  Skin: Skin is warm and dry.  Psychiatric: She has a normal mood and affect. Her behavior is normal. Judgment and thought content normal.    BP 130/72  Pulse 84  Temp(Src) 97.8 F (36.6 C) (Oral)  Ht 5\' 3"  (1.6 m)  Wt 182 lb (82.555 kg)  BMI 32.25 kg/m2       Assessment & Plan:  1. GERD (gastroesophageal reflux disease) Avoid spicy foods Do not eat 2 hours prior to bedtime - omeprazole (PRILOSEC) 40 MG capsule; Take 1 capsule (40 mg total) by mouth daily.  Dispense: 30 capsule; Refill: 5  2. Hyperlipidemia LDL goal < 100 Low fat diet and exercise - simvastatin (ZOCOR) 40 MG tablet; Take 1 tablet (40 mg total) by mouth at bedtime.  Dispense: 30 tablet; Refill: 5  3. Depression Stress management - FLUoxetine (PROZAC) 40 MG capsule; Take 1 capsule (40 mg total) by mouth daily.  Dispense: 30 capsule; Refill: 5  4. Hypothyroidism - levothyroxine (SYNTHROID, LEVOTHROID) 112 MCG tablet; Take 1 tablet (112 mcg  total) by mouth daily before breakfast.  Dispense: 30 tablet; Refill: 5  Mary-Margaret Daphine Deutscher, FNP

## 2012-12-06 ENCOUNTER — Ambulatory Visit (INDEPENDENT_AMBULATORY_CARE_PROVIDER_SITE_OTHER): Payer: PRIVATE HEALTH INSURANCE | Admitting: Nurse Practitioner

## 2012-12-06 ENCOUNTER — Encounter: Payer: Self-pay | Admitting: Nurse Practitioner

## 2012-12-06 VITALS — BP 141/80 | HR 71 | Temp 97.9°F | Ht 63.0 in | Wt 186.0 lb

## 2012-12-06 DIAGNOSIS — K219 Gastro-esophageal reflux disease without esophagitis: Secondary | ICD-10-CM

## 2012-12-06 DIAGNOSIS — F32A Depression, unspecified: Secondary | ICD-10-CM

## 2012-12-06 DIAGNOSIS — E039 Hypothyroidism, unspecified: Secondary | ICD-10-CM

## 2012-12-06 DIAGNOSIS — Z0000 Encounter for general adult medical examination without abnormal findings: Secondary | ICD-10-CM

## 2012-12-06 DIAGNOSIS — F329 Major depressive disorder, single episode, unspecified: Secondary | ICD-10-CM

## 2012-12-06 DIAGNOSIS — E785 Hyperlipidemia, unspecified: Secondary | ICD-10-CM

## 2012-12-06 LAB — POCT CBC
Granulocyte percent: 67.9 %G (ref 37–80)
HCT, POC: 41.1 % (ref 37.7–47.9)
Hemoglobin: 13.6 g/dL (ref 12.2–16.2)
Lymph, poc: 2 (ref 0.6–3.4)
MCH, POC: 29.2 pg (ref 27–31.2)
MCHC: 32.9 g/dL (ref 31.8–35.4)
MCV: 88.6 fL (ref 80–97)
MPV: 7.3 fL (ref 0–99.8)
POC Granulocyte: 4.9 (ref 2–6.9)
POC LYMPH PERCENT: 27.8 %L (ref 10–50)
Platelet Count, POC: 335 10*3/uL (ref 142–424)
RBC: 4.7 M/uL (ref 4.04–5.48)
RDW, POC: 13.2 %
WBC: 7.2 10*3/uL (ref 4.6–10.2)

## 2012-12-06 MED ORDER — VALACYCLOVIR HCL 1 G PO TABS: ORAL_TABLET | ORAL | Status: DC

## 2012-12-06 NOTE — Patient Instructions (Signed)

## 2012-12-06 NOTE — Progress Notes (Signed)
Subjective:    Patient ID: Heather [name], female    DOB: 03-Apr-1955, 57 y.o.   MRN: [id number]  HPI  Patient in today for well physical- she is doing well but says that she has no energy and just feels lazy. Patient Active Problem List   Diagnosis Date Noted  . GERD (gastroesophageal reflux disease) 09/27/2012  . Hyperlipidemia LDL goal < 100 09/27/2012  . Depression 09/03/2012  . Hypothyroidism 09/03/2012   Outpatient Encounter Prescriptions as of 12/06/2012  Medication Sig  . FLUoxetine (PROZAC) 40 MG capsule Take 1 capsule (40 mg total) by mouth daily.  Marland Kitchen levothyroxine (SYNTHROID, LEVOTHROID) 112 MCG tablet Take 1 tablet (112 mcg total) by mouth daily before breakfast.  . omeprazole (PRILOSEC) 40 MG capsule Take 1 capsule (40 mg total) by mouth daily.  . [DISCONTINUED] simvastatin (ZOCOR) 40 MG tablet Take 1 tablet (40 mg total) by mouth at bedtime.  . diclofenac (VOLTAREN) 75 MG EC tablet    * patient stopped taking zocor because it made her legs hurt    Review of Systems  Constitutional: Positive for fatigue. Negative for fever, chills, activity change and appetite change.  HENT: Negative.   Respiratory: Negative.   Cardiovascular: Negative.   Gastrointestinal: Negative.   Endocrine: Negative.   Genitourinary: Negative.   Musculoskeletal: Negative.   Neurological: Negative.   Hematological: Negative.   Psychiatric/Behavioral: Negative.        Objective:   Physical Exam  Constitutional: She is oriented to person, place, and time. She appears well-developed and well-nourished.  HENT:  Nose: Nose normal.  Mouth/Throat: Oropharynx is clear and moist.  Eyes: EOM are normal.  Neck: Trachea normal, normal range of motion and full passive range of motion without pain. Neck supple. No JVD present. Carotid bruit is not present. No thyromegaly present.  Cardiovascular: Normal rate, regular rhythm, normal heart sounds and intact distal pulses.  Exam reveals no gallop and no  friction rub.   No murmur heard. Pulmonary/Chest: Effort normal and breath sounds normal.  Abdominal: Soft. Bowel sounds are normal. She exhibits no distension and no mass. There is no tenderness.  Musculoskeletal: Normal range of motion.  Lymphadenopathy:    She has no cervical adenopathy.  Neurological: She is alert and oriented to person, place, and time. She has normal reflexes.  Skin: Skin is warm and dry.  Psychiatric: She has a normal mood and affect. Her behavior is normal. Judgment and thought content normal.    BP 141/80  Pulse 71  Temp(Src) 97.9 F (36.6 C) (Oral)  Ht 5\' 3"  (1.6 m)  Wt 186 lb (84.369 kg)  BMI 32.96 kg/m2       Assessment & Plan:   1. Annual physical exam   2. Hypothyroidism   3. Hyperlipidemia LDL goal < 100   4. GERD (gastroesophageal reflux disease)   5. Depression    Orders Placed This Encounter  Procedures  . CMP14+EGFR  . NMR, lipoprofile  . Thyroid Panel With TSH  . POCT CBC   Meds ordered this encounter  Medications  . diclofenac (VOLTAREN) 75 MG EC tablet    Sig:   . valACYclovir (VALTREX) 1000 MG tablet    Sig: 2 po BID for 1 day at fever blister onset    Dispense:  20 tablet    Refill:  1    Order Specific Question:  Supervising Provider    Answer:  Deborra Medina    Continue all meds Labs pending Diet and  exercise encouraged Health maintenance reviewed Follow up in 3 months  [place], FNP

## 2012-12-08 ENCOUNTER — Other Ambulatory Visit: Payer: Self-pay | Admitting: Nurse Practitioner

## 2012-12-08 DIAGNOSIS — E039 Hypothyroidism, unspecified: Secondary | ICD-10-CM

## 2012-12-08 LAB — CMP14+EGFR
ALT: 9 IU/L (ref 0–32)
AST: 12 IU/L (ref 0–40)
Albumin/Globulin Ratio: 1.9 (ref 1.1–2.5)
Albumin: 4.3 g/dL (ref 3.5–5.5)
Alkaline Phosphatase: 91 IU/L (ref 39–117)
BUN/Creatinine Ratio: 17 (ref 9–23)
BUN: 11 mg/dL (ref 6–24)
CO2: 25 mmol/L (ref 18–29)
Calcium: 9.9 mg/dL (ref 8.7–10.2)
Chloride: 101 mmol/L (ref 97–108)
Creatinine, Ser: 0.63 mg/dL (ref 0.57–1.00)
GFR calc Af Amer: 114 mL/min/{1.73_m2} (ref >59)
GFR calc non Af Amer: 99 mL/min/{1.73_m2} (ref >59)
Globulin, Total: 2.3 g/dL (ref 1.5–4.5)
Glucose: 87 mg/dL (ref 65–99)
Potassium: 5.1 mmol/L (ref 3.5–5.2)
Sodium: 140 mmol/L (ref 134–144)
Total Bilirubin: 0.3 mg/dL (ref 0.0–1.2)
Total Protein: 6.6 g/dL (ref 6.0–8.5)

## 2012-12-08 LAB — THYROID PANEL WITH TSH
Free Thyroxine Index: 2.3 (ref 1.2–4.9)
T3 Uptake Ratio: 28 % (ref 24–39)
T4, Total: 8.2 ug/dL (ref 4.5–12.0)
TSH: 5.15 u[IU]/mL — ABNORMAL HIGH (ref 0.450–4.500)

## 2012-12-08 LAB — NMR, LIPOPROFILE
Cholesterol: 219 mg/dL — ABNORMAL HIGH (ref <200)
HDL Cholesterol by NMR: 42 mg/dL (ref >=40)
HDL Particle Number: 27 umol/L — ABNORMAL LOW (ref >=30.5)
LDL Particle Number: 2301 nmol/L — ABNORMAL HIGH (ref <1000)
LDL Size: 20.5 nm — ABNORMAL LOW (ref >20.5)
LDLC SERPL CALC-MCNC: 142 mg/dL — ABNORMAL HIGH (ref <100)
LP-IR Score: 62 — ABNORMAL HIGH (ref <=45)
Small LDL Particle Number: 1262 nmol/L — ABNORMAL HIGH (ref <=527)
Triglycerides by NMR: 176 mg/dL — ABNORMAL HIGH (ref <150)

## 2012-12-08 MED ORDER — LEVOTHYROXINE SODIUM 100 MCG PO TABS: 100.0000 ug | ORAL_TABLET | Freq: Every day | ORAL | Status: DC

## 2012-12-08 MED ORDER — LEVOTHYROXINE SODIUM 112 MCG PO TABS: 112.0000 ug | ORAL_TABLET | Freq: Every day | ORAL | Status: DC

## 2012-12-11 ENCOUNTER — Telehealth: Payer: Self-pay | Admitting: Nurse Practitioner

## 2012-12-11 NOTE — Telephone Encounter (Signed)
Patient aware.

## 2012-12-17 ENCOUNTER — Other Ambulatory Visit: Payer: Self-pay | Admitting: Nurse Practitioner

## 2012-12-17 ENCOUNTER — Telehealth: Payer: Self-pay | Admitting: Nurse Practitioner

## 2012-12-18 NOTE — Telephone Encounter (Signed)
Labs weren't that off- continue what are taking and we will change it next time so as not to waste what she currently has. As far as choleserol meds- really needs to take

## 2013-01-30 NOTE — Telephone Encounter (Signed)
This message was taken care of in another encounter

## 2013-02-10 ENCOUNTER — Other Ambulatory Visit: Payer: Self-pay | Admitting: Family Medicine

## 2013-02-10 ENCOUNTER — Telehealth: Payer: Self-pay | Admitting: Nurse Practitioner

## 2013-02-14 NOTE — Telephone Encounter (Signed)
Pt had refills, she is aware

## 2013-10-20 ENCOUNTER — Other Ambulatory Visit: Payer: Self-pay | Admitting: *Deleted

## 2013-10-20 MED ORDER — LEVOTHYROXINE SODIUM 112 MCG PO TABS: 112.0000 ug | ORAL_TABLET | Freq: Every day | ORAL | Status: DC

## 2013-10-21 ENCOUNTER — Telehealth: Payer: Self-pay | Admitting: Family Medicine

## 2013-10-21 NOTE — Telephone Encounter (Signed)
Patient aware and appointment made

## 2013-10-21 NOTE — Telephone Encounter (Signed)
Yes has not had labs since 11/2012

## 2013-10-30 ENCOUNTER — Ambulatory Visit (INDEPENDENT_AMBULATORY_CARE_PROVIDER_SITE_OTHER): Payer: PRIVATE HEALTH INSURANCE | Admitting: Nurse Practitioner

## 2013-10-30 ENCOUNTER — Encounter: Payer: Self-pay | Admitting: Nurse Practitioner

## 2013-10-30 VITALS — BP 127/79 | HR 88 | Temp 98.3°F | Ht 63.0 in | Wt 177.4 lb

## 2013-10-30 DIAGNOSIS — E669 Obesity, unspecified: Secondary | ICD-10-CM | POA: Insufficient documentation

## 2013-10-30 DIAGNOSIS — K219 Gastro-esophageal reflux disease without esophagitis: Secondary | ICD-10-CM

## 2013-10-30 DIAGNOSIS — Z6831 Body mass index (BMI) 31.0-31.9, adult: Secondary | ICD-10-CM

## 2013-10-30 DIAGNOSIS — F32A Depression, unspecified: Secondary | ICD-10-CM

## 2013-10-30 DIAGNOSIS — E038 Other specified hypothyroidism: Secondary | ICD-10-CM

## 2013-10-30 DIAGNOSIS — E785 Hyperlipidemia, unspecified: Secondary | ICD-10-CM

## 2013-10-30 DIAGNOSIS — F329 Major depressive disorder, single episode, unspecified: Secondary | ICD-10-CM

## 2013-10-30 DIAGNOSIS — E034 Atrophy of thyroid (acquired): Secondary | ICD-10-CM

## 2013-10-30 MED ORDER — LEVOTHYROXINE SODIUM 112 MCG PO TABS: 112.0000 ug | ORAL_TABLET | Freq: Every day | ORAL | Status: DC

## 2013-10-30 MED ORDER — OMEPRAZOLE 40 MG PO CPDR: 40.0000 mg | DELAYED_RELEASE_CAPSULE | Freq: Every day | ORAL | Status: DC

## 2013-10-30 MED ORDER — FLUOXETINE HCL 40 MG PO CAPS: 40.0000 mg | ORAL_CAPSULE | Freq: Every day | ORAL | Status: DC

## 2013-10-30 NOTE — Progress Notes (Signed)
Subjective:    Patient ID: Heather [name], female    DOB: 01/24/1955, 58 y.o.   MRN: [id number]  Patient here today for follow up of chronic medical problems.   Hyperlipidemia This is a chronic problem. The current episode started more than 1 year ago. The problem is uncontrolled. Recent lipid tests were reviewed and are high. Exacerbating diseases include hypothyroidism. She has no history of diabetes or obesity. Factors aggravating her hyperlipidemia include smoking and fatty foods. Pertinent negatives include no chest pain, focal sensory loss, focal weakness, leg pain, myalgias or shortness of breath. Current antihyperlipidemic treatment includes statins (just started on statin 1 week ago). The current treatment provides moderate improvement of lipids. Risk factors for coronary artery disease include family history and post-menopausal.  Thyroid Problem Presents for follow-up (hypothyroidism) visit. Patient reports no anxiety, cold intolerance, constipation, diaphoresis, dry skin, hair loss, heat intolerance, hoarse voice, menstrual problem, nail problem, palpitations, visual change, weight gain or weight loss. Her past medical history is significant for hyperlipidemia. There is no history of diabetes.  GERD prilosec working well for symptoms Depression prozac- working okay- could be higher but doesn't want to increase  Review of Systems  Constitutional: Negative for weight loss, weight gain and diaphoresis.  HENT: Negative for hoarse voice.   Respiratory: Negative for shortness of breath.   Cardiovascular: Negative for chest pain and palpitations.  Gastrointestinal: Negative for constipation.  Endocrine: Negative for cold intolerance and heat intolerance.  Genitourinary: Negative for menstrual problem.  Musculoskeletal: Negative for myalgias.  Neurological: Negative for focal weakness.  All other systems reviewed and are negative.      Objective:   Physical Exam  Constitutional: She  is oriented to person, place, and time. She appears well-developed and well-nourished.  HENT:  Nose: Nose normal.  Mouth/Throat: Oropharynx is clear and moist.  Eyes: EOM are normal.  Neck: Trachea normal, normal range of motion and full passive range of motion without pain. Neck supple. No JVD present. Carotid bruit is not present. No thyromegaly present.  Cardiovascular: Normal rate, regular rhythm, normal heart sounds and intact distal pulses.  Exam reveals no gallop and no friction rub.   No murmur heard. Pulmonary/Chest: Effort normal and breath sounds normal.  Abdominal: Soft. Bowel sounds are normal. She exhibits no distension and no mass. There is no tenderness.  Musculoskeletal: Normal range of motion.  Lymphadenopathy:    She has no cervical adenopathy.  Neurological: She is alert and oriented to person, place, and time. She has normal reflexes.  Skin: Skin is warm and dry.  Psychiatric: She has a normal mood and affect. Her behavior is normal. Judgment and thought content normal.    BP 127/79  Pulse 88  Temp(Src) 98.3 F (36.8 C) (Oral)  Ht 5' 3"  (1.6 m)  Wt 177 lb 6 oz (80.457 kg)  BMI 31.43 kg/m2       Assessment & Plan:  1. Hypothyroidism due to acquired atrophy of thyroid - Thyroid Panel With TSH - levothyroxine (SYNTHROID, LEVOTHROID) 112 MCG tablet; Take 1 tablet (112 mcg total) by mouth daily before breakfast.  Dispense: 30 tablet; Refill: 0  2. Hyperlipidemia with target LDL less than 100 Low fat diet - CMP14+EGFR - NMR, lipoprofile  3. Gastroesophageal reflux disease without esophagitis Avoid eating 2 hours prior to bedtime - omeprazole (PRILOSEC) 40 MG capsule; Take 1 capsule (40 mg total) by mouth daily.  Dispense: 30 capsule; Refill: 5  4. Depression Stress management - FLUoxetine (PROZAC) 40 MG  capsule; Take 1 capsule (40 mg total) by mouth daily.  Dispense: 30 capsule; Refill: 5  5. BMi >25 Discussed diet and exercise for person with BMI  >25 Will recheck weight in 3-6 months  Patient to schedule mammogram Stop smoking Labs pending Health maintenance reviewed Diet and exercise encouraged Continue all meds Follow up  In 3 months   [place], FNP

## 2013-10-30 NOTE — Patient Instructions (Signed)

## 2013-10-31 ENCOUNTER — Other Ambulatory Visit: Payer: Self-pay | Admitting: Nurse Practitioner

## 2013-10-31 ENCOUNTER — Telehealth: Payer: Self-pay | Admitting: Nurse Practitioner

## 2013-10-31 LAB — CMP14+EGFR
ALT: 9 IU/L (ref 0–32)
AST: 14 IU/L (ref 0–40)
Albumin/Globulin Ratio: 1.8 (ref 1.1–2.5)
Albumin: 4.2 g/dL (ref 3.5–5.5)
Alkaline Phosphatase: 81 IU/L (ref 39–117)
BUN/Creatinine Ratio: 16 (ref 9–23)
BUN: 11 mg/dL (ref 6–24)
CO2: 25 mmol/L (ref 18–29)
Calcium: 9.6 mg/dL (ref 8.7–10.2)
Chloride: 102 mmol/L (ref 97–108)
Creatinine, Ser: 0.67 mg/dL (ref 0.57–1.00)
GFR calc Af Amer: 111 mL/min/{1.73_m2} (ref >59)
GFR calc non Af Amer: 97 mL/min/{1.73_m2} (ref >59)
Globulin, Total: 2.4 g/dL (ref 1.5–4.5)
Glucose: 97 mg/dL (ref 65–99)
Potassium: 4 mmol/L (ref 3.5–5.2)
Sodium: 142 mmol/L (ref 134–144)
Total Bilirubin: 0.3 mg/dL (ref 0.0–1.2)
Total Protein: 6.6 g/dL (ref 6.0–8.5)

## 2013-10-31 LAB — NMR, LIPOPROFILE
Cholesterol: 239 mg/dL — ABNORMAL HIGH (ref 100–199)
HDL Cholesterol by NMR: 36 mg/dL — ABNORMAL LOW (ref >39)
HDL Particle Number: 23.5 umol/L — ABNORMAL LOW (ref >=30.5)
LDL Particle Number: 1951 nmol/L — ABNORMAL HIGH (ref <1000)
LDL Size: 20.1 nm (ref >20.5)
LDLC SERPL CALC-MCNC: 152 mg/dL — ABNORMAL HIGH (ref 0–99)
LP-IR Score: 75 — ABNORMAL HIGH (ref <=45)
Small LDL Particle Number: 1014 nmol/L — ABNORMAL HIGH (ref <=527)
Triglycerides by NMR: 255 mg/dL — ABNORMAL HIGH (ref 0–149)

## 2013-10-31 LAB — THYROID PANEL WITH TSH
Free Thyroxine Index: 2.4 (ref 1.2–4.9)
T3 Uptake Ratio: 27 % (ref 24–39)
T4, Total: 8.8 ug/dL (ref 4.5–12.0)
TSH: 9.67 u[IU]/mL — ABNORMAL HIGH (ref 0.450–4.500)

## 2013-10-31 MED ORDER — LEVOTHYROXINE SODIUM 125 MCG PO TABS: 125.0000 ug | ORAL_TABLET | Freq: Every day | ORAL | Status: DC

## 2013-10-31 NOTE — Telephone Encounter (Signed)
Pt aware of lab results and increase in thyroid med / rx at pharmacy.  rs

## 2013-10-31 NOTE — Telephone Encounter (Signed)
Message copied by Cline Crock on Fri Oct 31, 2013  3:33 PM ------      Message from: Chevis Pretty      Created: Fri Oct 31, 2013  9:08 AM       Kidney and liver function stable      LDL particle numbers and LDL are still elevated but are improving- Low CVA risk- low fat diet and exercise recheck in 3 months      TSH elebvate increased levothyroxin dose to 174mcg daily- rx sent to pharmacy- need to repeat labs in 6 weeks       ------

## 2013-12-23 NOTE — Patient Instructions (Signed)
Your procedure is scheduled on: 12/29/2013  Report to [facility] at  77  AM.  Call this number if you have problems the morning of surgery: [phone]   Do not eat food or drink liquids :After Midnight.      Take these medicines the morning of surgery with A SIP OF WATER: prozac, levothyroxine, prilosec   Do not wear jewelry, make-up or nail polish.  Do not wear lotions, powders, or perfumes.   Do not shave 48 hours prior to surgery.  Do not bring valuables to the hospital.  Contacts, dentures or bridgework may not be worn into surgery.  Leave suitcase in the car. After surgery it may be brought to your room.  For patients admitted to the hospital, checkout time is 11:00 AM the day of discharge.   Patients discharged the day of surgery will not be allowed to drive home.  :     Please read over the following fact sheets that you were given: Coughing and Deep Breathing, Surgical Site Infection Prevention, Anesthesia Post-op Instructions and Care and Recovery After Surgery    Cataract A cataract is a clouding of the lens of the eye. When a lens becomes cloudy, vision is reduced based on the degree and nature of the clouding. Many cataracts reduce vision to some degree. Some cataracts make people more near-sighted as they develop. Other cataracts increase glare. Cataracts that are ignored and become worse can sometimes look white. The white color can be seen through the pupil. CAUSES   Aging. However, cataracts may occur at any age, even in newborns.   Certain drugs.   Trauma to the eye.   Certain diseases such as diabetes.   Specific eye diseases such as chronic inflammation inside the eye or a sudden attack of a rare form of glaucoma.   Inherited or acquired medical problems.  SYMPTOMS   Gradual, progressive drop in vision in the affected eye.   Severe, rapid visual loss. This most often happens when trauma is the cause.  DIAGNOSIS  To detect a cataract, an eye doctor  examines the lens. Cataracts are best diagnosed with an exam of the eyes with the pupils enlarged (dilated) by drops.  TREATMENT  For an early cataract, vision may improve by using different eyeglasses or stronger lighting. If that does not help your vision, surgery is the only effective treatment. A cataract needs to be surgically removed when vision loss interferes with your everyday activities, such as driving, reading, or watching TV. A cataract may also have to be removed if it prevents examination or treatment of another eye problem. Surgery removes the cloudy lens and usually replaces it with a substitute lens (intraocular lens, IOL).  At a time when both you and your doctor agree, the cataract will be surgically removed. If you have cataracts in both eyes, only one is usually removed at a time. This allows the operated eye to heal and be out of danger from any possible problems after surgery (such as infection or poor wound healing). In rare cases, a cataract may be doing damage to your eye. In these cases, your caregiver may advise surgical removal right away. The vast majority of people who have cataract surgery have better vision afterward. HOME CARE INSTRUCTIONS  If you are not planning surgery, you may be asked to do the following:  Use different eyeglasses.   Use stronger or brighter lighting.   Ask your eye doctor about reducing your medicine dose or changing  medicines if it is thought that a medicine caused your cataract. Changing medicines does not make the cataract go away on its own.   Become familiar with your surroundings. Poor vision can lead to injury. Avoid bumping into things on the affected side. You are at a higher risk for tripping or falling.   Exercise extreme care when driving or operating machinery.   Wear sunglasses if you are sensitive to bright [place] or experiencing problems with glare.  SEEK IMMEDIATE MEDICAL CARE IF:   You have a worsening or sudden vision  loss.   You notice redness, swelling, or increasing pain in the eye.   You have a fever.  Document Released: 01/02/2005 Document Revised: 12/22/2010 Document Reviewed: 08/26/2010 [facility] Patient Information 2012 [place].PATIENT INSTRUCTIONS POST-ANESTHESIA  IMMEDIATELY FOLLOWING SURGERY:  Do not drive or operate machinery for the first twenty four hours after surgery.  Do not make any important decisions for twenty four hours after surgery or while taking narcotic pain medications or sedatives.  If you develop intractable nausea and vomiting or a severe headache please notify your doctor immediately.  FOLLOW-UP:  Please make an appointment with your surgeon as instructed. You do not need to follow up with anesthesia unless specifically instructed to do so.  WOUND CARE INSTRUCTIONS (if applicable):  Keep a dry clean dressing on the anesthesia/puncture wound site if there is drainage.  Once the wound has quit draining you may leave it open to air.  Generally you should leave the bandage intact for twenty four hours unless there is drainage.  If the epidural site drains for more than 36-48 hours please call the anesthesia department.  QUESTIONS?:  Please feel free to call your physician or the hospital operator if you have any questions, and they will be happy to assist you.

## 2013-12-24 ENCOUNTER — Encounter (HOSPITAL_COMMUNITY)
Admission: RE | Admit: 2013-12-24 | Discharge: 2013-12-24 | Disposition: A | Discharge status: Home / Self Care | Payer: PRIVATE HEALTH INSURANCE | Source: Ambulatory Visit | Attending: Ophthalmology | Admitting: Ophthalmology

## 2013-12-24 ENCOUNTER — Other Ambulatory Visit: Payer: Self-pay

## 2013-12-24 ENCOUNTER — Encounter (HOSPITAL_COMMUNITY): Payer: Self-pay

## 2013-12-24 DIAGNOSIS — Z01818 Encounter for other preprocedural examination: Secondary | ICD-10-CM | POA: Insufficient documentation

## 2013-12-24 HISTORY — DX: Nausea with vomiting, unspecified: R11.2

## 2013-12-24 HISTORY — DX: Other specified postprocedural states: Z98.890

## 2013-12-24 HISTORY — DX: Hypothyroidism, unspecified: E03.9

## 2013-12-24 LAB — BASIC METABOLIC PANEL
Anion gap: 14 (ref 5–15)
BUN: 11 mg/dL (ref 6–23)
CO2: 23 mEq/L (ref 19–32)
Calcium: 9.8 mg/dL (ref 8.4–10.5)
Chloride: 103 mEq/L (ref 96–112)
Creatinine, Ser: 0.64 mg/dL (ref 0.50–1.10)
GFR calc Af Amer: >90 mL/min (ref >90)
GFR calc non Af Amer: >90 mL/min (ref >90)
Glucose, Bld: 92 mg/dL (ref 70–99)
Potassium: 4.2 mEq/L (ref 3.7–5.3)
Sodium: 140 mEq/L (ref 137–147)

## 2013-12-24 LAB — HEMOGLOBIN AND HEMATOCRIT, BLOOD
HCT: 39.2 % (ref 36.0–46.0)
Hemoglobin: 13.1 g/dL (ref 12.0–15.0)

## 2013-12-24 NOTE — Pre-Procedure Instructions (Signed)
Patient given information to sign up for my chart at home. 

## 2013-12-26 MED ORDER — CYCLOPENTOLATE-PHENYLEPHRINE OP SOLN OPTIME - NO CHARGE
OPHTHALMIC | Status: AC
  Filled 2013-12-26: qty 2

## 2013-12-26 MED ORDER — TETRACAINE HCL 0.5 % OP SOLN
OPHTHALMIC | Status: AC
  Filled 2013-12-26: qty 2

## 2013-12-26 MED ORDER — PHENYLEPHRINE HCL 2.5 % OP SOLN
OPHTHALMIC | Status: AC
  Filled 2013-12-26: qty 15

## 2013-12-26 MED ORDER — LIDOCAINE HCL (PF) 1 % IJ SOLN
INTRAMUSCULAR | Status: AC
  Filled 2013-12-26: qty 2

## 2013-12-26 MED ORDER — NEOMYCIN-POLYMYXIN-DEXAMETH 3.5-10000-0.1 OP SUSP
OPHTHALMIC | Status: AC
  Filled 2013-12-26: qty 5

## 2013-12-26 MED ORDER — LIDOCAINE HCL 3.5 % OP GEL
OPHTHALMIC | Status: AC
  Filled 2013-12-26: qty 1

## 2013-12-29 ENCOUNTER — Encounter (HOSPITAL_COMMUNITY)
Admission: RE | Disposition: A | Discharge status: Home / Self Care | Payer: Self-pay | Source: Ambulatory Visit | Attending: Ophthalmology

## 2013-12-29 ENCOUNTER — Encounter (HOSPITAL_COMMUNITY): Payer: Self-pay | Admitting: Anesthesiology

## 2013-12-29 ENCOUNTER — Ambulatory Visit (HOSPITAL_COMMUNITY): Payer: PRIVATE HEALTH INSURANCE | Admitting: Anesthesiology

## 2013-12-29 ENCOUNTER — Ambulatory Visit (HOSPITAL_COMMUNITY)
Admission: RE | Admit: 2013-12-29 | Discharge: 2013-12-29 | Disposition: A | Discharge status: Home / Self Care | Payer: PRIVATE HEALTH INSURANCE | Source: Ambulatory Visit | Attending: Ophthalmology | Admitting: Ophthalmology

## 2013-12-29 DIAGNOSIS — H2511 Age-related nuclear cataract, right eye: Secondary | ICD-10-CM | POA: Diagnosis not present

## 2013-12-29 HISTORY — PX: CATARACT EXTRACTION W/PHACO: SHX586

## 2013-12-29 SURGERY — PHACOEMULSIFICATION, CATARACT, WITH IOL INSERTION
Anesthesia: Monitor Anesthesia Care | Site: Eye | Laterality: Right

## 2013-12-29 MED ORDER — MIDAZOLAM HCL 2 MG/2ML IJ SOLN
INTRAMUSCULAR | Status: AC
  Filled 2013-12-29: qty 2

## 2013-12-29 MED ORDER — MIDAZOLAM HCL 2 MG/2ML IJ SOLN
1.0000 mg | INTRAMUSCULAR | Status: DC | PRN
  Administered 2013-12-29: 2 mg via INTRAVENOUS

## 2013-12-29 MED ORDER — BSS IO SOLN
INTRAOCULAR | Status: DC | PRN
  Administered 2013-12-29: 08:00:00

## 2013-12-29 MED ORDER — BSS IO SOLN
INTRAOCULAR | Status: DC | PRN
  Administered 2013-12-29: 30 mL via INTRAOCULAR

## 2013-12-29 MED ORDER — PROVISC 10 MG/ML IO SOLN
INTRAOCULAR | Status: DC | PRN
  Administered 2013-12-29: 0.85 mL via INTRAOCULAR

## 2013-12-29 MED ORDER — LIDOCAINE HCL 3.5 % OP GEL
1.0000 "application " | Freq: Once | OPHTHALMIC | Status: AC
  Administered 2013-12-29: 1 via OPHTHALMIC

## 2013-12-29 MED ORDER — TETRACAINE HCL 0.5 % OP SOLN
1.0000 [drp] | OPHTHALMIC | Status: AC
  Administered 2013-12-29 (×3): 1 [drp] via OPHTHALMIC

## 2013-12-29 MED ORDER — LACTATED RINGERS IV SOLN
INTRAVENOUS | Status: DC
  Administered 2013-12-29: 1000 mL via INTRAVENOUS

## 2013-12-29 MED ORDER — PHENYLEPHRINE HCL 2.5 % OP SOLN
1.0000 [drp] | OPHTHALMIC | Status: AC
  Administered 2013-12-29 (×3): 1 [drp] via OPHTHALMIC

## 2013-12-29 MED ORDER — LIDOCAINE 3.5 % OP GEL OPTIME - NO CHARGE
OPHTHALMIC | Status: DC | PRN
  Administered 2013-12-29: 1 [drp] via OPHTHALMIC

## 2013-12-29 MED ORDER — LIDOCAINE HCL (PF) 1 % IJ SOLN
INTRAMUSCULAR | Status: DC | PRN
  Administered 2013-12-29: .5 mL

## 2013-12-29 MED ORDER — CYCLOPENTOLATE-PHENYLEPHRINE 0.2-1 % OP SOLN
1.0000 [drp] | OPHTHALMIC | Status: AC
  Administered 2013-12-29 (×3): 1 [drp] via OPHTHALMIC

## 2013-12-29 MED ORDER — ONDANSETRON HCL 4 MG/2ML IJ SOLN
4.0000 mg | Freq: Once | INTRAMUSCULAR | Status: AC
  Administered 2013-12-29: 4 mg via INTRAVENOUS

## 2013-12-29 MED ORDER — LACTATED RINGERS IV SOLN
INTRAVENOUS | Status: DC | PRN
  Administered 2013-12-29: 07:00:00 via INTRAVENOUS

## 2013-12-29 MED ORDER — EPINEPHRINE HCL 1 MG/ML IJ SOLN
INTRAMUSCULAR | Status: AC
  Filled 2013-12-29: qty 1

## 2013-12-29 MED ORDER — FENTANYL CITRATE 0.05 MG/ML IJ SOLN
25.0000 ug | INTRAMUSCULAR | Status: AC
  Administered 2013-12-29 (×2): 25 ug via INTRAVENOUS

## 2013-12-29 MED ORDER — FENTANYL CITRATE 0.05 MG/ML IJ SOLN
INTRAMUSCULAR | Status: AC
  Filled 2013-12-29: qty 2

## 2013-12-29 MED ORDER — ONDANSETRON HCL 4 MG/2ML IJ SOLN
INTRAMUSCULAR | Status: AC
  Filled 2013-12-29: qty 2

## 2013-12-29 MED ORDER — NEOMYCIN-POLYMYXIN-DEXAMETH 3.5-10000-0.1 OP SUSP
OPHTHALMIC | Status: DC | PRN
  Administered 2013-12-29: 1 [drp] via OPHTHALMIC

## 2013-12-29 MED ORDER — POVIDONE-IODINE 5 % OP SOLN
OPHTHALMIC | Status: DC | PRN
  Administered 2013-12-29: 1 via OPHTHALMIC

## 2013-12-29 SURGICAL SUPPLY — 33 items
CAPSULAR TENSION RING-AMO (OPHTHALMIC RELATED) IMPLANT
CLOTH BEACON ORANGE TIMEOUT ST (SAFETY) ×1 IMPLANT
EYE SHIELD UNIVERSAL CLEAR (GAUZE/BANDAGES/DRESSINGS) ×1 IMPLANT
GLOVE BIO SURGEON STRL SZ 6.5 (GLOVE) IMPLANT
GLOVE BIOGEL PI IND STRL 6.5 (GLOVE) IMPLANT
GLOVE BIOGEL PI IND STRL 7.0 (GLOVE) IMPLANT
GLOVE BIOGEL PI IND STRL 7.5 (GLOVE) IMPLANT
GLOVE BIOGEL PI INDICATOR 6.5 (GLOVE) ×2
GLOVE BIOGEL PI INDICATOR 7.0 (GLOVE)
GLOVE BIOGEL PI INDICATOR 7.5 (GLOVE)
GLOVE ECLIPSE 6.5 STRL STRAW (GLOVE) IMPLANT
GLOVE ECLIPSE 7.0 STRL STRAW (GLOVE) IMPLANT
GLOVE ECLIPSE 7.5 STRL STRAW (GLOVE) IMPLANT
GLOVE EXAM NITRILE LRG STRL (GLOVE) IMPLANT
GLOVE EXAM NITRILE MD LF STRL (GLOVE) IMPLANT
GLOVE SKINSENSE NS SZ6.5 (GLOVE)
GLOVE SKINSENSE NS SZ7.0 (GLOVE)
GLOVE SKINSENSE STRL SZ6.5 (GLOVE) IMPLANT
GLOVE SKINSENSE STRL SZ7.0 (GLOVE) IMPLANT
KIT VITRECTOMY (OPHTHALMIC RELATED) IMPLANT
PAD ARMBOARD 7.5X6 YLW CONV (MISCELLANEOUS) ×1 IMPLANT
PROC W NO LENS (INTRAOCULAR LENS)
PROC W SPEC LENS (INTRAOCULAR LENS)
PROCESS W NO LENS (INTRAOCULAR LENS) IMPLANT
PROCESS W SPEC LENS (INTRAOCULAR LENS) IMPLANT
RETRACTOR IRIS SIGHTPATH (OPHTHALMIC RELATED) IMPLANT
RING MALYGIN (MISCELLANEOUS) IMPLANT
SIGHTPATH CAT PROC W REG LENS (Ophthalmic Related) ×2 IMPLANT
SYRINGE LUER LOK 1CC (MISCELLANEOUS) ×1 IMPLANT
TAPE SURG TRANSPORE 1 IN (GAUZE/BANDAGES/DRESSINGS) IMPLANT
TAPE SURGICAL TRANSPORE 1 IN (GAUZE/BANDAGES/DRESSINGS) ×1
VISCOELASTIC ADDITIONAL (OPHTHALMIC RELATED) IMPLANT
WATER STERILE IRR 250ML POUR (IV SOLUTION) ×1 IMPLANT

## 2013-12-29 NOTE — Op Note (Signed)
Date of Admission: 12/29/2013  Date of Surgery: 12/29/2013   Pre-Op Dx: Cataract Right Eye  Post-Op Dx: Senile Nuclear Cataract Right  Eye,  Dx Code H25.11  Surgeon: Tonny Branch, M.D.  Assistants: None  Anesthesia: Topical with MAC  Indications: Painless, progressive loss of vision with compromise of daily activities.  Surgery: Cataract Extraction with Intraocular lens Implant Right Eye  Discription: The patient had dilating drops and viscous lidocaine placed into the Right eye in the pre-op holding area. After transfer to the operating room, a time out was performed. The patient was then prepped and draped. Beginning with a 47 degree blade a paracentesis port was made at the surgeon's 2 o'clock position. The anterior chamber was then filled with 1% non-preserved lidocaine. This was followed by filling the anterior chamber with Provisc.  A 2.51mm keratome blade was used to make a clear corneal incision at the temporal limbus.  A bent cystatome needle was used to create a continuous tear capsulotomy. Hydrodissection was performed with balanced salt solution on a Fine canula. The lens nucleus was then removed using the phacoemulsification handpiece. Residual cortex was removed with the I&A handpiece. The anterior chamber and capsular bag were refilled with Provisc. A posterior chamber intraocular lens was placed into the capsular bag with it's injector. The implant was positioned with the Kuglan hook. The Provisc was then removed from the anterior chamber and capsular bag with the I&A handpiece. Stromal hydration of the main incision and paracentesis port was performed with BSS on a Fine canula. The wounds were tested for leak which was negative. The patient tolerated the procedure well. There were no operative complications. The patient was then transferred to the recovery room in stable condition.  Complications: None  Specimen: None  EBL: None  Prosthetic device: Hoya iSert 250, power 18.5  D, SN [id number].

## 2013-12-29 NOTE — Anesthesia Postprocedure Evaluation (Signed)
  Anesthesia Post-op Note  Patient: Heather [name]  Procedure(s) Performed: Procedure(s) with comments: CATARACT EXTRACTION PHACO AND INTRAOCULAR LENS PLACEMENT (IOC) (Right) - CDE:4.84  Patient Location: Short Stay  Anesthesia Type:MAC  Level of Consciousness: awake, alert , oriented and patient cooperative  Airway and Oxygen Therapy: Patient Spontanous Breathing  Post-op Pain: none  Post-op Assessment: Post-op Vital signs reviewed, Patient's Cardiovascular Status Stable, Respiratory Function Stable, Patent Airway, No signs of Nausea or vomiting and Pain level controlled  Post-op Vital Signs: Reviewed and stable  Last Vitals:  Filed Vitals:   12/29/13 0720  BP: 118/56  Temp:   Resp: 14    Complications: No apparent anesthesia complications

## 2013-12-29 NOTE — Anesthesia Preprocedure Evaluation (Signed)
Anesthesia Evaluation  Patient identified by MRN, date of birth, ID band Patient awake    Reviewed: Allergy & Precautions, H&P , NPO status , Patient's Chart, lab work & pertinent test results  History of Anesthesia Complications (+) PONV and history of anesthetic complications  Airway Mallampati: II  TM Distance: >3 FB     Dental  (+) Teeth Intact   Pulmonary Current Smoker,  breath sounds clear to auscultation        Cardiovascular negative cardio ROS  Rhythm:Regular Rate:Normal     Neuro/Psych PSYCHIATRIC DISORDERS Anxiety Depression  Neuromuscular disease    GI/Hepatic GERD-  Medicated and Controlled,  Endo/Other  Hypothyroidism   Renal/GU      Musculoskeletal  (+) Fibromyalgia -  Abdominal   Peds  Hematology   Anesthesia Other Findings   Reproductive/Obstetrics                             Anesthesia Physical Anesthesia Plan  ASA: II  Anesthesia Plan: MAC   Post-op Pain Management:    Induction: Intravenous  Airway Management Planned: Nasal Cannula  Additional Equipment:   Intra-op Plan:   Post-operative Plan:   Informed Consent: I have reviewed the patients History and Physical, chart, labs and discussed the procedure including the risks, benefits and alternatives for the proposed anesthesia with the patient or authorized representative who has indicated his/her understanding and acceptance.     Plan Discussed with:   Anesthesia Plan Comments:         Anesthesia Quick Evaluation

## 2013-12-29 NOTE — Transfer of Care (Signed)
Immediate Anesthesia Transfer of Care Note  Patient: Heather [name]  Procedure(s) Performed: Procedure(s) with comments: CATARACT EXTRACTION PHACO AND INTRAOCULAR LENS PLACEMENT (IOC) (Right) - CDE:4.84  Patient Location: Short Stay  Anesthesia Type:MAC  Level of Consciousness: awake, alert , oriented and patient cooperative  Airway & Oxygen Therapy: Patient Spontanous Breathing  Post-op Assessment: Report given to PACU RN and Post -op Vital signs reviewed and stable  Post vital signs: Reviewed and stable  Complications: No apparent anesthesia complications

## 2013-12-29 NOTE — H&P (Signed)
I have reviewed the H&P, the patient was re-examined, and I have identified no interval changes in medical condition and plan of care since the history and physical of record  

## 2013-12-29 NOTE — Anesthesia Procedure Notes (Signed)
Procedure Name: MAC Date/Time: 12/29/2013 7:25 AM Performed by: Andree Elk, Lachanda Buczek A Pre-anesthesia Checklist: Patient identified, Timeout performed, Emergency Drugs available, Suction available and Patient being monitored Oxygen Delivery Method: Nasal cannula

## 2013-12-29 NOTE — Discharge Instructions (Signed)

## 2013-12-31 ENCOUNTER — Encounter (HOSPITAL_COMMUNITY): Payer: Self-pay | Admitting: Ophthalmology

## 2014-01-13 ENCOUNTER — Ambulatory Visit (INDEPENDENT_AMBULATORY_CARE_PROVIDER_SITE_OTHER): Payer: 59

## 2014-01-13 ENCOUNTER — Encounter: Payer: Self-pay | Admitting: Nurse Practitioner

## 2014-01-13 ENCOUNTER — Ambulatory Visit (INDEPENDENT_AMBULATORY_CARE_PROVIDER_SITE_OTHER): Payer: 59 | Admitting: Nurse Practitioner

## 2014-01-13 VITALS — BP 146/85 | HR 76 | Temp 97.0°F | Ht 64.0 in | Wt 174.0 lb

## 2014-01-13 DIAGNOSIS — Z0000 Encounter for general adult medical examination without abnormal findings: Secondary | ICD-10-CM

## 2014-01-13 DIAGNOSIS — Z720 Tobacco use: Secondary | ICD-10-CM

## 2014-01-13 DIAGNOSIS — F17200 Nicotine dependence, unspecified, uncomplicated: Secondary | ICD-10-CM

## 2014-01-13 DIAGNOSIS — E038 Other specified hypothyroidism: Secondary | ICD-10-CM

## 2014-01-13 DIAGNOSIS — K219 Gastro-esophageal reflux disease without esophagitis: Secondary | ICD-10-CM

## 2014-01-13 DIAGNOSIS — E785 Hyperlipidemia, unspecified: Secondary | ICD-10-CM

## 2014-01-13 DIAGNOSIS — F329 Major depressive disorder, single episode, unspecified: Secondary | ICD-10-CM

## 2014-01-13 DIAGNOSIS — F32A Depression, unspecified: Secondary | ICD-10-CM

## 2014-01-13 DIAGNOSIS — Z6831 Body mass index (BMI) 31.0-31.9, adult: Secondary | ICD-10-CM

## 2014-01-13 DIAGNOSIS — Z01419 Encounter for gynecological examination (general) (routine) without abnormal findings: Secondary | ICD-10-CM

## 2014-01-13 DIAGNOSIS — E034 Atrophy of thyroid (acquired): Secondary | ICD-10-CM

## 2014-01-13 LAB — POCT UA - MICROSCOPIC ONLY
Casts, Ur, LPF, POC: NEGATIVE
Crystals, Ur, HPF, POC: NEGATIVE
Mucus, UA: NEGATIVE
RBC, urine, microscopic: NEGATIVE
Yeast, UA: NEGATIVE

## 2014-01-13 LAB — POCT URINALYSIS DIPSTICK
Bilirubin, UA: NEGATIVE
Blood, UA: NEGATIVE
Glucose, UA: NEGATIVE
Ketones, UA: NEGATIVE
Nitrite, UA: POSITIVE
Protein, UA: NEGATIVE
Spec Grav, UA: 1.01
Urobilinogen, UA: NEGATIVE
pH, UA: 8

## 2014-01-13 LAB — POCT CBC
Granulocyte percent: 70 %G (ref 37–80)
HCT, POC: 39.9 % (ref 37.7–47.9)
Hemoglobin: 12.9 g/dL (ref 12.2–16.2)
Lymph, poc: 2.1 (ref 0.6–3.4)
MCH, POC: 28.9 pg (ref 27–31.2)
MCHC: 32.4 g/dL (ref 31.8–35.4)
MCV: 89.1 fL (ref 80–97)
MPV: 8.1 fL (ref 0–99.8)
POC Granulocyte: 5.5 (ref 2–6.9)
POC LYMPH PERCENT: 26.2 %L (ref 10–50)
Platelet Count, POC: 316 10*3/uL (ref 142–424)
RBC: 4.5 M/uL (ref 4.04–5.48)
RDW, POC: 12.3 %
WBC: 7.9 10*3/uL (ref 4.6–10.2)

## 2014-01-13 NOTE — Addendum Note (Signed)
Addended by: Earlene Plater on: 01/13/2014 11:30 AM   Modules accepted: Miquel Dunn

## 2014-01-13 NOTE — Progress Notes (Signed)
Subjective:    Patient ID: Heather [name], female    DOB: 1955-01-31, 58 y.o.   MRN: [id number]  Patient here today for annual physical exam.   Hyperlipidemia This is a chronic problem. The current episode started more than 1 year ago. The problem is uncontrolled. Recent lipid tests were reviewed and are high. Exacerbating diseases include hypothyroidism and obesity. She has no history of diabetes. Pertinent negatives include no chest pain, myalgias or shortness of breath. She is currently on no antihyperlipidemic treatment. The current treatment provides moderate improvement of lipids. Compliance problems include adherence to diet and adherence to exercise.  Risk factors for coronary artery disease include dyslipidemia, hypertension, obesity and post-menopausal.  Thyroid Problem Patient reports no cold intolerance, constipation, diaphoresis, heat intolerance, menstrual problem, palpitations or visual change. Her past medical history is significant for hyperlipidemia. There is no history of diabetes.  GERD prilosec working well for symptoms Depression prozac- working okay- could be higher but doesn't want to increase  Review of Systems  Constitutional: Negative for diaphoresis.  Respiratory: Negative for shortness of breath.   Cardiovascular: Negative for chest pain and palpitations.  Gastrointestinal: Negative for constipation.  Endocrine: Negative for cold intolerance and heat intolerance.  Genitourinary: Negative for menstrual problem.  Musculoskeletal: Negative for myalgias.  All other systems reviewed and are negative.      Objective:   Physical Exam  Constitutional: She is oriented to person, place, and time. She appears well-developed and well-nourished.  HENT:  Head: Normocephalic.  Right Ear: Hearing, tympanic membrane, external ear and ear canal normal.  Left Ear: Hearing, tympanic membrane, external ear and ear canal normal.  Nose: Nose normal.  Mouth/Throat: Uvula is  midline and oropharynx is clear and moist.  Eyes: Conjunctivae and EOM are normal. Pupils are equal, round, and reactive to Mccuistion.  Neck: Normal range of motion and full passive range of motion without pain. Neck supple. No JVD present. Carotid bruit is not present. No thyroid mass and no thyromegaly present.  Cardiovascular: Normal rate, normal heart sounds and intact distal pulses.   No murmur heard. Pulmonary/Chest: Effort normal and breath sounds normal. Right breast exhibits no inverted nipple, no mass, no nipple discharge, no skin change and no tenderness. Left breast exhibits no inverted nipple, no mass, no nipple discharge, no skin change and no tenderness.  Abdominal: Soft. Bowel sounds are normal. She exhibits no mass. There is no tenderness.  Genitourinary: Vagina normal and uterus normal. No breast swelling, tenderness, discharge or bleeding.  bimanual exam-No adnexal masses or tenderness.   Musculoskeletal: Normal range of motion.  Lymphadenopathy:    She has no cervical adenopathy.  Neurological: She is alert and oriented to person, place, and time.  Skin: Skin is warm and dry.  Psychiatric: She has a normal mood and affect. Her behavior is normal. Judgment and thought content normal.    BP 146/85 mmHg  Pulse 76  Temp(Src) 97 F (36.1 C) (Oral)  Ht $R'5\' 4"'Yn$  (1.626 m)  Wt 174 lb (78.926 kg)  BMI 29.85 kg/m2       Assessment & Plan:  1. Annual physical exam - POCT UA - Microscopic Only - POCT urinalysis dipstick - POCT CBC  2. Encounter for routine gynecological examination - Pap IG w/ reflex to HPV when ASC-U  3. Gastroesophageal reflux disease without esophagitis Avoid spicy and fatty foods  4. Hypothyroidism due to acquired atrophy of thyroid - Thyroid Panel With TSH  5. BMI 31.0-31.9,adult Discussed diet and exercise  for person with BMI >25 Will recheck weight in 3-6 months   6. Depression Stress management  7. Hyperlipidemia with target LDL less than  100 Low fat diet - CMP14+EGFR - NMR, lipoprofile  8. Smoker Smoking cessation discussed   Patient to schedule mammogram Labs pending Health maintenance reviewed Diet and exercise encouraged Continue all meds Follow up  In 3 months   [place], FNP

## 2014-01-13 NOTE — Patient Instructions (Signed)

## 2014-01-14 LAB — CMP14+EGFR
ALT: 8 IU/L (ref 0–32)
AST: 12 IU/L (ref 0–40)
Albumin/Globulin Ratio: 1.9 (ref 1.1–2.5)
Albumin: 4.3 g/dL (ref 3.5–5.5)
Alkaline Phosphatase: 84 IU/L (ref 39–117)
BUN/Creatinine Ratio: 24 — ABNORMAL HIGH (ref 9–23)
BUN: 14 mg/dL (ref 6–24)
CO2: 25 mmol/L (ref 18–29)
Calcium: 9.3 mg/dL (ref 8.7–10.2)
Chloride: 101 mmol/L (ref 97–108)
Creatinine, Ser: 0.58 mg/dL (ref 0.57–1.00)
GFR calc Af Amer: 117 mL/min/{1.73_m2} (ref >59)
GFR calc non Af Amer: 101 mL/min/{1.73_m2} (ref >59)
Globulin, Total: 2.3 g/dL (ref 1.5–4.5)
Glucose: 84 mg/dL (ref 65–99)
Potassium: 4.4 mmol/L (ref 3.5–5.2)
Sodium: 138 mmol/L (ref 134–144)
Total Bilirubin: 0.2 mg/dL (ref 0.0–1.2)
Total Protein: 6.6 g/dL (ref 6.0–8.5)

## 2014-01-14 LAB — NMR, LIPOPROFILE
Cholesterol: 225 mg/dL — ABNORMAL HIGH (ref 100–199)
HDL Cholesterol by NMR: 37 mg/dL — ABNORMAL LOW (ref >39)
HDL Particle Number: 22.1 umol/L — ABNORMAL LOW (ref >=30.5)
LDL Particle Number: 1777 nmol/L — ABNORMAL HIGH (ref <1000)
LDL Size: 20.5 nm (ref >20.5)
LDL-C: 150 mg/dL — ABNORMAL HIGH (ref 0–99)
LP-IR Score: 66 — ABNORMAL HIGH (ref <=45)
Small LDL Particle Number: 996 nmol/L — ABNORMAL HIGH (ref <=527)
Triglycerides by NMR: 190 mg/dL — ABNORMAL HIGH (ref 0–149)

## 2014-01-14 LAB — THYROID PANEL WITH TSH
Free Thyroxine Index: 3.1 (ref 1.2–4.9)
T3 Uptake Ratio: 29 % (ref 24–39)
T4, Total: 10.6 ug/dL (ref 4.5–12.0)
TSH: 1.37 u[IU]/mL (ref 0.450–4.500)

## 2014-01-14 NOTE — Addendum Note (Signed)
Addended by: Earlene Plater on: 01/14/2014 11:43 AM   Modules accepted: Miquel Dunn

## 2014-01-15 LAB — PAP IG W/ RFLX HPV ASCU: PAP Smear Comment: 0

## 2014-05-02 ENCOUNTER — Other Ambulatory Visit: Payer: Self-pay | Admitting: Nurse Practitioner

## 2014-07-14 ENCOUNTER — Other Ambulatory Visit: Payer: Self-pay | Admitting: Nurse Practitioner

## 2014-07-14 NOTE — Telephone Encounter (Signed)
Patient NTBS for follow up and lab work  

## 2014-07-14 NOTE — Telephone Encounter (Signed)
Last seen 01/13/14  MMM

## 2014-08-09 ENCOUNTER — Other Ambulatory Visit: Payer: Self-pay | Admitting: Nurse Practitioner

## 2014-09-16 ENCOUNTER — Other Ambulatory Visit: Payer: Self-pay | Admitting: Nurse Practitioner

## 2014-09-17 NOTE — Telephone Encounter (Signed)
Last seen 01/13/14 MMM No upcoming appt scheduled

## 2014-10-26 ENCOUNTER — Ambulatory Visit (INDEPENDENT_AMBULATORY_CARE_PROVIDER_SITE_OTHER): Payer: BLUE CROSS/BLUE SHIELD | Admitting: *Deleted

## 2014-10-26 DIAGNOSIS — Z23 Encounter for immunization: Secondary | ICD-10-CM

## 2014-11-25 ENCOUNTER — Other Ambulatory Visit: Payer: Self-pay | Admitting: Nurse Practitioner

## 2014-11-25 NOTE — Telephone Encounter (Signed)
Not seen since 12/2013

## 2014-11-26 NOTE — Telephone Encounter (Signed)
Last refill without being seen Patient NTBS for follow up and lab work

## 2014-12-01 ENCOUNTER — Encounter: Payer: Self-pay | Admitting: Nurse Practitioner

## 2014-12-01 ENCOUNTER — Ambulatory Visit (INDEPENDENT_AMBULATORY_CARE_PROVIDER_SITE_OTHER): Payer: BLUE CROSS/BLUE SHIELD | Admitting: Nurse Practitioner

## 2014-12-01 VITALS — BP 128/86 | HR 97 | Temp 97.7°F | Ht 64.0 in | Wt 174.0 lb

## 2014-12-01 DIAGNOSIS — Z0000 Encounter for general adult medical examination without abnormal findings: Secondary | ICD-10-CM

## 2014-12-01 DIAGNOSIS — F32A Depression, unspecified: Secondary | ICD-10-CM

## 2014-12-01 DIAGNOSIS — F329 Major depressive disorder, single episode, unspecified: Secondary | ICD-10-CM

## 2014-12-01 DIAGNOSIS — K219 Gastro-esophageal reflux disease without esophagitis: Secondary | ICD-10-CM

## 2014-12-01 DIAGNOSIS — Z1159 Encounter for screening for other viral diseases: Secondary | ICD-10-CM

## 2014-12-01 DIAGNOSIS — Z6829 Body mass index (BMI) 29.0-29.9, adult: Secondary | ICD-10-CM

## 2014-12-01 DIAGNOSIS — E785 Hyperlipidemia, unspecified: Secondary | ICD-10-CM

## 2014-12-01 DIAGNOSIS — E034 Atrophy of thyroid (acquired): Secondary | ICD-10-CM

## 2014-12-01 MED ORDER — LEVOTHYROXINE SODIUM 125 MCG PO TABS: 125.0000 ug | ORAL_TABLET | Freq: Every day | ORAL | Status: DC

## 2014-12-01 MED ORDER — BUSPIRONE HCL 15 MG PO TABS: 15.0000 mg | ORAL_TABLET | Freq: Three times a day (TID) | ORAL | Status: DC

## 2014-12-01 MED ORDER — FLUOXETINE HCL 40 MG PO CAPS: 40.0000 mg | ORAL_CAPSULE | Freq: Every day | ORAL | Status: DC

## 2014-12-01 MED ORDER — OMEPRAZOLE 40 MG PO CPDR: 40.0000 mg | DELAYED_RELEASE_CAPSULE | Freq: Every day | ORAL | Status: DC

## 2014-12-01 NOTE — Patient Instructions (Signed)
Stress and Stress Management Stress is a normal reaction to life events. It is what you feel when life demands more than you are used to or more than you can handle. Some stress can be useful. For example, the stress reaction can help you catch the last bus of the day, study for a test, or meet a deadline at work. But stress that occurs too often or for too long can cause problems. It can affect your emotional health and interfere with relationships and normal daily activities. Too much stress can weaken your immune system and increase your risk for physical illness. If you already have a medical problem, stress can make it worse. CAUSES  All sorts of life events may cause stress. An event that causes stress for one person may not be stressful for another person. Major life events commonly cause stress. These may be positive or negative. Examples include losing your job, moving into a new home, getting married, having a baby, or losing a loved one. Less obvious life events may also cause stress, especially if they occur day after day or in combination. Examples include working long hours, driving in traffic, caring for children, being in debt, or being in a difficult relationship. SIGNS AND SYMPTOMS Stress may cause emotional symptoms including, the following:  Anxiety. This is feeling worried, afraid, on edge, overwhelmed, or out of control.  Anger. This is feeling irritated or impatient.  Depression. This is feeling sad, down, helpless, or guilty.  Difficulty focusing, remembering, or making decisions. Stress may cause physical symptoms, including the following:   Aches and pains. These may affect your head, neck, back, stomach, or other areas of your body.  Tight muscles or clenched jaw.  Low energy or trouble sleeping. Stress may cause unhealthy behaviors, including the following:   Eating to feel better (overeating) or skipping meals.  Sleeping too little, too much, or both.  Working  too much or putting off tasks (procrastination).  Smoking, drinking alcohol, or using drugs to feel better. DIAGNOSIS  Stress is diagnosed through an assessment by your health care provider. Your health care provider will ask questions about your symptoms and any stressful life events.Your health care provider will also ask about your medical history and may order blood tests or other tests. Certain medical conditions and medicine can cause physical symptoms similar to stress. Mental illness can cause emotional symptoms and unhealthy behaviors similar to stress. Your health care provider may refer you to a mental health professional for further evaluation.  TREATMENT  Stress management is the recommended treatment for stress.The goals of stress management are reducing stressful life events and coping with stress in healthy ways.  Techniques for reducing stressful life events include the following:  Stress identification. Self-monitor for stress and identify what causes stress for you. These skills may help you to avoid some stressful events.  Time management. Set your priorities, keep a calendar of events, and learn to say "no." These tools can help you avoid making too many commitments. Techniques for coping with stress include the following:  Rethinking the problem. Try to think realistically about stressful events rather than ignoring them or overreacting. Try to find the positives in a stressful situation rather than focusing on the negatives.  Exercise. Physical exercise can release both physical and emotional tension. The key is to find a form of exercise you enjoy and do it regularly.  Relaxation techniques. These relax the body and mind. Examples include yoga, meditation, tai chi, biofeedback, deep  breathing, progressive muscle relaxation, listening to music, being out in nature, journaling, and other hobbies. Again, the key is to find one or more that you enjoy and can do  regularly.  Healthy lifestyle. Eat a balanced diet, get plenty of sleep, and do not smoke. Avoid using alcohol or drugs to relax.  Strong support network. Spend time with family, friends, or other people you enjoy being around.Express your feelings and talk things over with someone you trust. Counseling or talktherapy with a mental health professional may be helpful if you are having difficulty managing stress on your own. Medicine is typically not recommended for the treatment of stress.Talk to your health care provider if you think you need medicine for symptoms of stress. HOME CARE INSTRUCTIONS  Keep all follow-up visits as directed by your health care provider.  Take all medicines as directed by your health care provider. SEEK MEDICAL CARE IF:  Your symptoms get worse or you start having new symptoms.  You feel overwhelmed by your problems and can no longer manage them on your own. SEEK IMMEDIATE MEDICAL CARE IF:  You feel like hurting yourself or someone else.   This information is not intended to replace advice given to you by your health care provider. Make sure you discuss any questions you have with your health care provider.   Document Released: 06/28/2000 Document Revised: 01/23/2014 Document Reviewed: 08/27/2012 [employer] Interactive Patient Education 2016 [employer]  

## 2014-12-01 NOTE — Progress Notes (Signed)
Subjective:    Patient ID: Heather [name], female    DOB: [birth year], 59 y.o.   MRN: [id number]  Patient here today for annual physical exam no PAP.   Hyperlipidemia This is a chronic problem. The current episode started more than 1 year ago. The problem is uncontrolled. Recent lipid tests were reviewed and are high. Exacerbating diseases include hypothyroidism and obesity. She has no history of diabetes. Pertinent negatives include no chest pain, myalgias or shortness of breath. She is currently on no antihyperlipidemic treatment. The current treatment provides moderate improvement of lipids. Compliance problems include adherence to diet and adherence to exercise.  Risk factors for coronary artery disease include dyslipidemia, hypertension, obesity and post-menopausal.  Thyroid Problem Patient reports no cold intolerance, constipation, diaphoresis, heat intolerance, menstrual problem, palpitations or visual change. Her past medical history is significant for hyperlipidemia. There is no history of diabetes.  GERD prilosec working well for symptoms Depression prozac- working okay- could be higher but doesn't want to increase  Review of Systems  Constitutional: Negative for diaphoresis.  Respiratory: Negative for shortness of breath.   Cardiovascular: Negative for chest pain and palpitations.  Gastrointestinal: Negative for constipation.  Endocrine: Negative for cold intolerance and heat intolerance.  Genitourinary: Negative for menstrual problem.  Musculoskeletal: Negative for myalgias.  All other systems reviewed and are negative.      Objective:   Physical Exam  Constitutional: She is oriented to person, place, and time. She appears well-developed and well-nourished.  HENT:  Head: Normocephalic.  Right Ear: Hearing, tympanic membrane, external ear and ear canal normal.  Left Ear: Hearing, tympanic membrane, external ear and ear canal normal.  Nose: Nose normal.  Mouth/Throat: Uvula  is midline and oropharynx is clear and moist.  Eyes: Conjunctivae and EOM are normal. Pupils are equal, round, and reactive to Pel[place].  Neck: Normal range of motion and full passive range of motion without pain. Neck supple. No JVD present. Carotid bruit is not present. No thyroid mass and no thyromegaly present.  Cardiovascular: Normal rate, normal heart sounds and intact distal pulses.   No murmur heard. Pulmonary/Chest: Effort normal and breath sounds normal. Right breast exhibits no inverted nipple, no mass, no nipple discharge, no skin change and no tenderness. Left breast exhibits no inverted nipple, no mass, no nipple discharge, no skin change and no tenderness.  Abdominal: Soft. Bowel sounds are normal. She exhibits no mass. There is no tenderness.  Genitourinary: No breast swelling, tenderness, discharge or bleeding.  Musculoskeletal: Normal range of motion.  Lymphadenopathy:    She has no cervical adenopathy.  Neurological: She is alert and oriented to person, place, and time.  Skin: Skin is warm and dry.  Psychiatric: She has a normal mood and affect. Her behavior is normal. Judgment and thought content normal.    BP 128/86 mmHg  Pulse 97  Temp(Src) 97.7 F (36.5 C) (Oral)  Ht 5' 4" (1.626 m)  Wt 174 lb (78.926 kg)  BMI 29.85 kg/m2        Assessment & Plan:  1. Gastroesophageal reflux disease without esophagitis Avoid spicy foods Do not eat 2 hours prior to bedtime - omeprazole (PRILOSEC) 40 MG capsule; Take 1 capsule (40 mg total) by mouth daily.  Dispense: 90 capsule; Refill: 1  2. Hypothyroidism due to acquired atrophy of thyroid - levothyroxine (SYNTHROID, LEVOTHROID) 125 MCG tablet; Take 1 tablet (125 mcg total) by mouth daily.  Dispense: 90 tablet; Refill: 2 - Thyroid Panel With TSH  3. Depression  Stress management Added buspar to meds - FLUoxetine (PROZAC) 40 MG capsule; Take 1 capsule (40 mg total) by mouth daily.  Dispense: 90 capsule; Refill: 1 -  busPIRone (BUSPAR) 15 MG tablet; Take 1 tablet (15 mg total) by mouth 3 (three) times daily.  Dispense: 60 tablet; Refill: 2  4. Hyperlipidemia with target LDL less than 100 Low fat diet - CMP14+EGFR - Lipid panel  5. BMI 29.0-29.9,adult Discussed diet and exercise for person with BMI >25 Will recheck weight in 3-6 months  6. Annual physical exam - CBC with Differential/Platelet - VITAMIN D 25 Hydroxy (Vit-D Deficiency, Fractures)    Labs pending Health maintenance reviewed Diet and exercise encouraged Continue all meds Follow up  In 6 months   [place], FNP

## 2014-12-02 LAB — CBC WITH DIFFERENTIAL/PLATELET
Basophils Absolute: 0 10*3/uL (ref 0.0–0.2)
Basos: 0 %
EOS (ABSOLUTE): 0.1 10*3/uL (ref 0.0–0.4)
Eos: 1 %
Hematocrit: 38.7 % (ref 34.0–46.6)
Hemoglobin: 13 g/dL (ref 11.1–15.9)
Immature Grans (Abs): 0 10*3/uL (ref 0.0–0.1)
Immature Granulocytes: 0 %
Lymphocytes Absolute: 2 10*3/uL (ref 0.7–3.1)
Lymphs: 20 %
MCH: 29.8 pg (ref 26.6–33.0)
MCHC: 33.6 g/dL (ref 31.5–35.7)
MCV: 89 fL (ref 79–97)
Monocytes Absolute: 0.5 10*3/uL (ref 0.1–0.9)
Monocytes: 5 %
Neutrophils Absolute: 7.1 10*3/uL — ABNORMAL HIGH (ref 1.4–7.0)
Neutrophils: 74 %
Platelets: 361 10*3/uL (ref 150–379)
RBC: 4.36 x10E6/uL (ref 3.77–5.28)
RDW: 13.7 % (ref 12.3–15.4)
WBC: 9.8 10*3/uL (ref 3.4–10.8)

## 2014-12-02 LAB — THYROID PANEL WITH TSH
Free Thyroxine Index: 1.9 (ref 1.2–4.9)
T3 Uptake Ratio: 23 % — ABNORMAL LOW (ref 24–39)
T4, Total: 8.2 ug/dL (ref 4.5–12.0)
TSH: 10.18 u[IU]/mL — ABNORMAL HIGH (ref 0.450–4.500)

## 2014-12-02 LAB — CMP14+EGFR
ALT: 9 IU/L (ref 0–32)
AST: 16 IU/L (ref 0–40)
Albumin/Globulin Ratio: 2 (ref 1.1–2.5)
Albumin: 4.5 g/dL (ref 3.6–4.8)
Alkaline Phosphatase: 82 IU/L (ref 39–117)
BUN/Creatinine Ratio: 17 (ref 11–26)
BUN: 12 mg/dL (ref 8–27)
Bilirubin Total: 0.4 mg/dL (ref 0.0–1.2)
CO2: 22 mmol/L (ref 18–29)
Calcium: 9.7 mg/dL (ref 8.7–10.3)
Chloride: 100 mmol/L (ref 97–106)
Creatinine, Ser: 0.69 mg/dL (ref 0.57–1.00)
GFR calc Af Amer: 109 mL/min/{1.73_m2} (ref >59)
GFR calc non Af Amer: 95 mL/min/{1.73_m2} (ref >59)
Globulin, Total: 2.3 g/dL (ref 1.5–4.5)
Glucose: 84 mg/dL (ref 65–99)
Potassium: 4.2 mmol/L (ref 3.5–5.2)
Sodium: 138 mmol/L (ref 136–144)
Total Protein: 6.8 g/dL (ref 6.0–8.5)

## 2014-12-02 LAB — LIPID PANEL
Chol/HDL Ratio: 6.4 ratio units — ABNORMAL HIGH (ref 0.0–4.4)
Cholesterol, Total: 251 mg/dL — ABNORMAL HIGH (ref 100–199)
HDL: 39 mg/dL — ABNORMAL LOW (ref >39)
LDL Calculated: 183 mg/dL — ABNORMAL HIGH (ref 0–99)
Triglycerides: 143 mg/dL (ref 0–149)
VLDL Cholesterol Cal: 29 mg/dL (ref 5–40)

## 2014-12-02 LAB — VITAMIN D 25 HYDROXY (VIT D DEFICIENCY, FRACTURES): Vit D, 25-Hydroxy: 13.7 ng/mL — ABNORMAL LOW (ref 30.0–100.0)

## 2014-12-02 LAB — HEPATITIS C ANTIBODY: Hep C Virus Ab: 0.1 s/co ratio (ref 0.0–0.9)

## 2014-12-03 ENCOUNTER — Other Ambulatory Visit: Payer: Self-pay | Admitting: Nurse Practitioner

## 2014-12-03 MED ORDER — LEVOTHYROXINE SODIUM 150 MCG PO TABS: 150.0000 ug | ORAL_TABLET | Freq: Every day | ORAL | Status: DC

## 2014-12-04 ENCOUNTER — Telehealth: Payer: Self-pay | Admitting: Nurse Practitioner

## 2014-12-04 DIAGNOSIS — E034 Atrophy of thyroid (acquired): Secondary | ICD-10-CM

## 2014-12-04 NOTE — Telephone Encounter (Signed)
Patient aware of results and verbalizes understanding.  

## 2015-01-12 ENCOUNTER — Other Ambulatory Visit: Payer: BLUE CROSS/BLUE SHIELD

## 2015-01-15 ENCOUNTER — Other Ambulatory Visit: Payer: BLUE CROSS/BLUE SHIELD

## 2015-01-15 DIAGNOSIS — E034 Atrophy of thyroid (acquired): Secondary | ICD-10-CM

## 2015-01-15 LAB — HM MAMMOGRAPHY

## 2015-01-16 LAB — THYROID PANEL WITH TSH
Free Thyroxine Index: 2.3 (ref 1.2–4.9)
T3 Uptake Ratio: 27 % (ref 24–39)
T4, Total: 8.5 ug/dL (ref 4.5–12.0)
TSH: 1.5 u[IU]/mL (ref 0.450–4.500)

## 2015-01-17 HISTORY — PX: FOOT FRACTURE SURGERY: SHX645

## 2015-01-21 ENCOUNTER — Encounter: Payer: Self-pay | Admitting: *Deleted

## 2015-02-26 ENCOUNTER — Other Ambulatory Visit: Payer: Self-pay | Admitting: Nurse Practitioner

## 2015-04-23 ENCOUNTER — Encounter: Payer: Self-pay | Admitting: Gastroenterology

## 2015-05-12 ENCOUNTER — Ambulatory Visit (AMBULATORY_SURGERY_CENTER): Payer: Self-pay

## 2015-05-12 VITALS — Ht 64.0 in | Wt 169.4 lb

## 2015-05-12 DIAGNOSIS — Z860100 Personal history of colon polyps, unspecified: Secondary | ICD-10-CM

## 2015-05-12 MED ORDER — SUPREP BOWEL PREP KIT 17.5-3.13-1.6 GM/177ML PO SOLN: 1.0000 | Freq: Once | ORAL | Status: DC

## 2015-05-12 NOTE — Progress Notes (Signed)
No allergies to eggs or soy No diet meds No home oxygen PONV  Has internet; refused emmi

## 2015-05-17 ENCOUNTER — Encounter: Payer: Self-pay | Admitting: Gastroenterology

## 2015-05-26 ENCOUNTER — Encounter: Payer: Self-pay | Admitting: Gastroenterology

## 2015-05-26 ENCOUNTER — Ambulatory Visit (AMBULATORY_SURGERY_CENTER): Payer: BLUE CROSS/BLUE SHIELD | Admitting: Gastroenterology

## 2015-05-26 VITALS — BP 138/68 | HR 57 | Temp 97.7°F | Resp 16 | Ht 64.0 in | Wt 169.0 lb

## 2015-05-26 DIAGNOSIS — D122 Benign neoplasm of ascending colon: Secondary | ICD-10-CM

## 2015-05-26 DIAGNOSIS — D120 Benign neoplasm of cecum: Secondary | ICD-10-CM

## 2015-05-26 DIAGNOSIS — Z800 Family history of malignant neoplasm of digestive organs: Secondary | ICD-10-CM

## 2015-05-26 DIAGNOSIS — Z86010 Personal history of colonic polyps: Secondary | ICD-10-CM | POA: Diagnosis not present

## 2015-05-26 DIAGNOSIS — D123 Benign neoplasm of transverse colon: Secondary | ICD-10-CM

## 2015-05-26 MED ORDER — SODIUM CHLORIDE 0.9 % IV SOLN: 500.0000 mL | INTRAVENOUS | Status: DC

## 2015-05-26 NOTE — Progress Notes (Signed)
Called to room to assist during endoscopic procedure.  Patient ID and intended procedure confirmed with present staff. Received instructions for my participation in the procedure from the performing physician.  

## 2015-05-26 NOTE — Op Note (Signed)
[facility] Patient Name: Heather [name] Procedure Date: 05/26/2015 10:49 AM MRN: [id number] Endoscopist: Remo Lipps P. Havery Moros , MD Age: 60 Date of Birth: October 02, 1955 Gender: Female Procedure:                Colonoscopy Indications:              High risk colon cancer surveillance: Personal                            history of colonic polyps, Family history of colon                            cancer in a first-degree relative Medicines:                Monitored Anesthesia Care Procedure:                Pre-Anesthesia Assessment:                           - Prior to the procedure, a History and Physical                            was performed, and patient medications and                            allergies were reviewed. The patient's tolerance of                            previous anesthesia was also reviewed. The risks                            and benefits of the procedure and the sedation                            options and risks were discussed with the patient.                            All questions were answered, and informed consent                            was obtained. Prior Anticoagulants: The patient has                            taken no previous anticoagulant or antiplatelet                            agents. ASA Grade Assessment: II - A patient with                            mild systemic disease. After reviewing the risks                            and benefits, the patient was deemed in  satisfactory condition to undergo the procedure.                           After obtaining informed consent, the colonoscope                            was passed under direct vision. Throughout the                            procedure, the patient's blood pressure, pulse, and                            oxygen saturations were monitored continuously. The                            Model PCF-[id number]) scope was introduced                  through the anus and advanced to the the terminal                            ileum, with identification of the appendiceal                            orifice and IC valve. The colonoscopy was performed                            without difficulty. The patient tolerated the                            procedure well. The quality of the bowel                            preparation was good. The terminal ileum, ileocecal                            valve, appendiceal orifice, and rectum were                            photographed. Scope In: 10:59:55 AM Scope Out: 11:23:33 AM Scope Withdrawal Time: 0 hours 20 minutes 5 seconds  Total Procedure Duration: 0 hours 23 minutes 38 seconds  Findings:                 The perianal and digital rectal examinations were                            normal.                           A 6 mm polyp was found in the ileocecal valve. The                            polyp was sessile. The polyp was removed with a  cold snare. Resection and retrieval were complete.                           A 12 mm polyp was found in the ascending colon. The                            polyp was flat. The polyp was grasped in entirety                            with a stiff snare and attempted to be removed with                            a cold snare however it would not cut through. One                            pulse of "pulsed cut" setting was applied and the                            polyp was easily resected. Resection and retrieval                            were complete.                           A 4 mm polyp was found in the ascending colon. The                            polyp was sessile. The polyp was removed with a                            cold snare. Resection and retrieval were complete.                           Two sessile polyps were found in the transverse                            colon. The polyps were 4 to 5 mm in  size. These                            polyps were removed with a cold snare. Resection                            and retrieval were complete.                           Non-bleeding internal hemorrhoids were found during                            retroflexion. The hemorrhoids were small.                           The terminal ileum appeared normal.  The exam was otherwise without abnormality. Complications:            No immediate complications. Estimated blood loss:                            Minimal. Estimated Blood Loss:     Estimated blood loss was minimal. Impression:               - One 6 mm polyp at the ileocecal valve, removed                            with a cold snare. Resected and retrieved.                           - One 12 mm polyp in the ascending colon, removed                            with a cold snare. Resected and retrieved.                           - One 4 mm polyp in the ascending colon, removed                            with a cold snare. Resected and retrieved.                           - Two 4 to 5 mm polyps in the transverse colon,                            removed with a cold snare. Resected and retrieved.                           - Non-bleeding internal hemorrhoids.                           - The examined portion of the ileum was normal.                           - The examination was otherwise normal. Recommendation:           - Patient has a contact number available for                            emergencies. The signs and symptoms of potential                            delayed complications were discussed with the                            patient. Return to normal activities tomorrow.                            Written discharge instructions were provided to the  patient.                           - Resume previous diet.                           - Continue present medications.                            - No aspirin, ibuprofen, naproxen, or other                            non-steroidal anti-inflammatory drugs for 2 weeks                            after polyp removal.                           - Await pathology results.                           - Repeat colonoscopy is recommended for                            surveillance. The colonoscopy date will be                            determined after pathology results from today's                            exam become available for review. Remo Lipps P. Armbruster, MD 05/26/2015 11:30:11 AM This report has been signed electronically.

## 2015-05-26 NOTE — Progress Notes (Signed)
Transferred to recover vss report to [employer]

## 2015-05-26 NOTE — Patient Instructions (Addendum)
YOU HAD AN ENDOSCOPIC PROCEDURE TODAY AT [facility]:   Refer to the procedure report that was given to you for any specific questions about what was found during the examination.  If the procedure report does not answer your questions, please call your gastroenterologist to clarify.  If you requested that your care partner not be given the details of your procedure findings, then the procedure report has been included in a sealed envelope for you to review at your convenience later.  YOU SHOULD EXPECT: Some feelings of bloating in the abdomen. Passage of more gas than usual.  Walking can help get rid of the air that was put into your GI tract during the procedure and reduce the bloating. If you had a lower endoscopy (such as a colonoscopy or flexible sigmoidoscopy) you may notice spotting of blood in your stool or on the toilet paper. If you underwent a bowel prep for your procedure, you may not have a normal bowel movement for a few days.  Please Note:  You might notice some irritation and congestion in your nose or some drainage.  This is from the oxygen used during your procedure.  There is no need for concern and it should clear up in a day or so.  SYMPTOMS TO REPORT IMMEDIATELY:   Following lower endoscopy (colonoscopy or flexible sigmoidoscopy):  Excessive amounts of blood in the stool  Significant tenderness or worsening of abdominal pains  Swelling of the abdomen that is new, acute  Fever of 100F or higher   For urgent or emergent issues, a gastroenterologist can be reached at any hour by calling [phone].   DIET: Your first meal following the procedure should be a small meal and then it is ok to progress to your normal diet. Heavy or fried foods are harder to digest and may make you feel nauseous or bloated.  Likewise, meals heavy in dairy and vegetables can increase bloating.  Drink plenty of fluids but you should avoid alcoholic beverages for 24  hours.  ACTIVITY:  You should plan to take it easy for the rest of today and you should NOT DRIVE or use heavy machinery until tomorrow (because of the sedation medicines used during the test).    FOLLOW UP: Our staff will call the number listed on your records the next business day following your procedure to check on you and address any questions or concerns that you may have regarding the information given to you following your procedure. If we do not reach you, we will leave a message.  However, if you are feeling well and you are not experiencing any problems, there is no need to return our call.  We will assume that you have returned to your regular daily activities without incident.  If any biopsies were taken you will be contacted by phone or by letter within the next 1-3 weeks.  Please call us at [phone] if you have not heard about the biopsies in 3 weeks.    SIGNATURES/CONFIDENTIALITY: You and/or your care partner have signed paperwork which will be entered into your electronic medical record.  These signatures attest to the fact that that the information above on your After Visit Summary has been reviewed and is understood.  Full responsibility of the confidentiality of this discharge information lies with you and/or your care-partner.  Polyp and hemorrhoid information given.  No aspirin, ibuprofen, naproxen or any anti-inflammatory meds for 2 weeks.

## 2015-05-27 ENCOUNTER — Telehealth: Payer: Self-pay

## 2015-05-27 NOTE — Telephone Encounter (Signed)
  Follow up Call-  Call back number 05/26/2015  Post procedure Call Back phone  # [phone] or cell [phone]  Permission to leave phone message Yes     Patient questions:  Do you have a fever, pain , or abdominal swelling? No. Pain Score  0 *  Have you tolerated food without any problems? Yes.    Have you been able to return to your normal activities? Yes.    Do you have any questions about your discharge instructions: Diet   No. Medications  No. Follow up visit  No.  Do you have questions or concerns about your Care? No.  Actions: * If pain score is 4 or above: No action needed, pain <4.

## 2015-06-02 ENCOUNTER — Encounter: Payer: Self-pay | Admitting: Gastroenterology

## 2015-06-13 ENCOUNTER — Other Ambulatory Visit: Payer: Self-pay | Admitting: Nurse Practitioner

## 2015-08-09 ENCOUNTER — Other Ambulatory Visit: Payer: Self-pay

## 2015-08-09 DIAGNOSIS — F32A Depression, unspecified: Secondary | ICD-10-CM

## 2015-08-09 DIAGNOSIS — F329 Major depressive disorder, single episode, unspecified: Secondary | ICD-10-CM

## 2015-08-09 MED ORDER — BUSPIRONE HCL 15 MG PO TABS: 15.0000 mg | ORAL_TABLET | Freq: Three times a day (TID) | ORAL | 0 refills | Status: DC

## 2015-08-10 ENCOUNTER — Other Ambulatory Visit: Payer: Self-pay

## 2015-08-10 DIAGNOSIS — F32A Depression, unspecified: Secondary | ICD-10-CM

## 2015-08-10 DIAGNOSIS — F329 Major depressive disorder, single episode, unspecified: Secondary | ICD-10-CM

## 2015-08-10 MED ORDER — BUSPIRONE HCL 15 MG PO TABS: 15.0000 mg | ORAL_TABLET | Freq: Three times a day (TID) | ORAL | 0 refills | Status: DC

## 2015-08-11 ENCOUNTER — Telehealth: Payer: Self-pay | Admitting: Nurse Practitioner

## 2015-08-11 ENCOUNTER — Other Ambulatory Visit: Payer: Self-pay

## 2015-08-11 MED ORDER — OMEPRAZOLE 40 MG PO CPDR
40.0000 mg | DELAYED_RELEASE_CAPSULE | Freq: Every day | ORAL | 0 refills | Status: DC
Start: 2015-08-11 — End: 2015-09-24

## 2015-08-11 NOTE — Telephone Encounter (Signed)
Please review

## 2015-08-12 ENCOUNTER — Other Ambulatory Visit: Payer: Self-pay | Admitting: Nurse Practitioner

## 2015-08-12 DIAGNOSIS — F329 Major depressive disorder, single episode, unspecified: Secondary | ICD-10-CM

## 2015-08-12 DIAGNOSIS — F32A Depression, unspecified: Secondary | ICD-10-CM

## 2015-08-12 MED ORDER — BUSPIRONE HCL 15 MG PO TABS: 15.0000 mg | ORAL_TABLET | Freq: Three times a day (TID) | ORAL | 1 refills | Status: DC

## 2015-08-12 NOTE — Telephone Encounter (Signed)
Pt aware that corrected Rx was sent to pharmacy

## 2015-08-12 NOTE — Telephone Encounter (Signed)
Will correct rx

## 2015-08-30 ENCOUNTER — Other Ambulatory Visit: Payer: Self-pay | Admitting: *Deleted

## 2015-08-30 MED ORDER — LEVOTHYROXINE SODIUM 150 MCG PO TABS: 150.0000 ug | ORAL_TABLET | Freq: Every day | ORAL | 0 refills | Status: DC

## 2015-09-24 ENCOUNTER — Other Ambulatory Visit: Payer: Self-pay | Admitting: *Deleted

## 2015-09-24 MED ORDER — OMEPRAZOLE 40 MG PO CPDR
40.0000 mg | DELAYED_RELEASE_CAPSULE | Freq: Every day | ORAL | 0 refills | Status: DC
Start: 2015-09-24 — End: 2015-12-03

## 2015-11-04 ENCOUNTER — Other Ambulatory Visit: Payer: Self-pay | Admitting: Nurse Practitioner

## 2015-11-04 DIAGNOSIS — F32A Depression, unspecified: Secondary | ICD-10-CM

## 2015-11-04 DIAGNOSIS — F329 Major depressive disorder, single episode, unspecified: Secondary | ICD-10-CM

## 2015-12-02 ENCOUNTER — Telehealth: Payer: Self-pay | Admitting: *Deleted

## 2015-12-03 ENCOUNTER — Other Ambulatory Visit: Payer: Self-pay | Admitting: Nurse Practitioner

## 2015-12-03 MED ORDER — OMEPRAZOLE 40 MG PO CPDR: 40.0000 mg | DELAYED_RELEASE_CAPSULE | Freq: Every day | ORAL | 0 refills | Status: DC

## 2015-12-18 NOTE — Telephone Encounter (Signed)
Closing encounter medication refilled on 12/03/15

## 2016-01-01 ENCOUNTER — Other Ambulatory Visit: Payer: Self-pay | Admitting: Nurse Practitioner

## 2016-01-01 DIAGNOSIS — F329 Major depressive disorder, single episode, unspecified: Secondary | ICD-10-CM

## 2016-01-01 DIAGNOSIS — F32A Depression, unspecified: Secondary | ICD-10-CM

## 2016-01-04 ENCOUNTER — Ambulatory Visit (INDEPENDENT_AMBULATORY_CARE_PROVIDER_SITE_OTHER): Payer: BLUE CROSS/BLUE SHIELD | Admitting: Family

## 2016-01-04 ENCOUNTER — Encounter: Payer: Self-pay | Admitting: Family

## 2016-01-04 ENCOUNTER — Other Ambulatory Visit: Payer: Self-pay | Admitting: *Deleted

## 2016-01-04 VITALS — BP 123/75 | HR 79 | Temp 99.1°F | Ht 64.0 in | Wt 181.0 lb

## 2016-01-04 DIAGNOSIS — E034 Atrophy of thyroid (acquired): Secondary | ICD-10-CM | POA: Diagnosis not present

## 2016-01-04 DIAGNOSIS — B0089 Other herpesviral infection: Secondary | ICD-10-CM | POA: Diagnosis not present

## 2016-01-04 DIAGNOSIS — E785 Hyperlipidemia, unspecified: Secondary | ICD-10-CM | POA: Diagnosis not present

## 2016-01-04 DIAGNOSIS — K219 Gastro-esophageal reflux disease without esophagitis: Secondary | ICD-10-CM | POA: Diagnosis not present

## 2016-01-04 DIAGNOSIS — F411 Generalized anxiety disorder: Secondary | ICD-10-CM

## 2016-01-04 DIAGNOSIS — F17200 Nicotine dependence, unspecified, uncomplicated: Secondary | ICD-10-CM

## 2016-01-04 DIAGNOSIS — F331 Major depressive disorder, recurrent, moderate: Secondary | ICD-10-CM

## 2016-01-04 MED ORDER — LEVOTHYROXINE SODIUM 150 MCG PO TABS: 150.0000 ug | ORAL_TABLET | Freq: Every day | ORAL | 1 refills | Status: DC

## 2016-01-04 MED ORDER — OMEPRAZOLE 40 MG PO CPDR: 40.0000 mg | DELAYED_RELEASE_CAPSULE | Freq: Every day | ORAL | 1 refills | Status: DC

## 2016-01-04 MED ORDER — VALACYCLOVIR HCL 1 G PO TABS: ORAL_TABLET | ORAL | 1 refills | Status: DC

## 2016-01-04 MED ORDER — FLUOXETINE HCL 40 MG PO CAPS: 40.0000 mg | ORAL_CAPSULE | Freq: Every day | ORAL | 1 refills | Status: DC

## 2016-01-04 MED ORDER — BUSPIRONE HCL 15 MG PO TABS: 15.0000 mg | ORAL_TABLET | Freq: Three times a day (TID) | ORAL | 1 refills | Status: DC

## 2016-01-04 NOTE — Progress Notes (Signed)
Subjective:    Patient ID: Heather [name], female    DOB: 05-17-1955, 60 y.o.   MRN: [id number]  Pt presents to the office today for chronic follow up and lab work.  Gastroesophageal Reflux  She complains of a hoarse voice. She reports no chest pain, no coughing or no heartburn. This is a chronic problem. The current episode started more than 1 year ago. The problem occurs rarely. The problem has been resolved. The symptoms are aggravated by lying down and smoking. Risk factors include smoking/tobacco exposure. She has tried a PPI for the symptoms. The treatment provided significant relief.  Hyperlipidemia  This is a chronic problem. The current episode started more than 1 year ago. The problem is uncontrolled. Recent lipid tests were reviewed and are high. Exacerbating diseases include hypothyroidism and obesity. Factors aggravating her hyperlipidemia include smoking. Pertinent negatives include no chest pain. Current antihyperlipidemic treatment includes diet change. The current treatment provides mild improvement of lipids. Risk factors for coronary artery disease include family history, dyslipidemia, hypertension, obesity and a sedentary lifestyle.  Depression         This is a chronic problem.  The current episode started more than 1 year ago.   The onset quality is gradual.   The problem occurs rarely.  The problem has been resolved since onset.  Associated symptoms include no decreased concentration, no helplessness, no hopelessness, not irritable, no restlessness and not sad.     The symptoms are aggravated by family issues.  Past treatments include SSRIs - Selective serotonin reuptake inhibitors.  Compliance with treatment is good.  Past medical history includes hypothyroidism, thyroid problem and anxiety.   Anxiety  Presents for follow-up visit. Symptoms include excessive worry and nervous/anxious behavior. Patient reports no chest pain, decreased concentration, depressed mood, irritability,  panic or restlessness. Symptoms occur occasionally.    Thyroid Problem  Presents for follow-up visit. Symptoms include anxiety, dry skin and hoarse voice. Patient reports no constipation, depressed mood or diarrhea. The symptoms have been stable. Her past medical history is significant for hyperlipidemia.  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     Review of Systems  Constitutional: Negative for irritability.  HENT: Positive for hoarse voice.   Respiratory: Negative for cough.   Cardiovascular: Negative for chest pain.  Gastrointestinal: Negative for constipation, diarrhea and heartburn.  Psychiatric/Behavioral: Positive for depression. Negative for decreased concentration. The patient is nervous/anxious.   All other systems reviewed and are negative.      Objective:   Physical Exam  Constitutional: She is oriented to person, place, and time. She appears well-developed and well-nourished. She is not irritable. No distress.  HENT:  Head: Normocephalic and atraumatic.  Right Ear: External ear normal.  Left Ear: External ear normal.  Nose: Nose normal.  Mouth/Throat: Oropharynx is clear and moist.  Eyes: Pupils are equal, round, and reactive to [place].  Neck: Normal range of motion. Neck supple. No thyromegaly present.  Cardiovascular: Normal rate, regular rhythm, normal heart sounds and intact distal pulses.   No murmur heard. Pulmonary/Chest: Effort normal and breath sounds normal. No respiratory distress. She has no wheezes.  Abdominal: Soft. Bowel sounds are normal. She exhibits no distension. There is no tenderness.  Musculoskeletal: Normal range of motion. She exhibits no edema or tenderness.  Ortho boot on left foot  Neurological: She is alert and oriented to person, place, and time.  Skin: Skin is warm and  dry.  Psychiatric: She has a normal mood and affect. Her behavior is normal. Judgment and thought content normal.    Vitals reviewed.     BP 123/75   Pulse 79   Temp 99.1 F (37.3 C) (Oral)   Ht _0  (1.626 m)   Wt 181 lb (82.1 kg)   BMI 31.07 kg/m      Assessment & Plan:  1. Hypothyroidism due to acquired atrophy of thyroid - CMP14+EGFR - Thyroid Panel With TSH - levothyroxine (SYNTHROID, LEVOTHROID) 150 MCG tablet; Take 1 tablet (150 mcg total) by mouth daily.  Dispense: 90 tablet; Refill: 1  2. Gastroesophageal reflux disease without esophagitis - CMP14+EGFR - omeprazole (PRILOSEC) 40 MG capsule; Take 1 capsule (40 mg total) by mouth daily.  Dispense: 90 capsule; Refill: 1  3. Moderate episode of recurrent major depressive disorder (HCC - CMP14+EGFR - busPIRone (BUSPAR) 15 MG tablet; Take 1 tablet (15 mg total) by mouth 3 (three) times daily.  Dispense: 90 tablet; Refill: 1 - FLUoxetine (PROZAC) 40 MG capsule; Take 1 capsule (40 mg total) by mouth daily.  Dispense: 90 capsule; Refill: 1  4. GAD (generalized anxiety disorder) - CMP14+EGFR - busPIRone (BUSPAR) 15 MG tablet; Take 1 tablet (15 mg total) by mouth 3 (three) times daily.  Dispense: 90 tablet; Refill: 1 - FLUoxetine (PROZAC) 40 MG capsule; Take 1 capsule (40 mg total) by mouth daily.  Dispense: 90 capsule; Refill: 1  5. Hyperlipidemia with target LDL less than 100 - CMP14+EGFR - Lipid panel  6. Recurrent oral herpes simplex infection - CMP14+EGFR - valACYclovir (VALTREX) 1000 MG tablet; 2 po BID for 1 day at fever blister onset  Dispense: 20 tablet; Refill: 1  7. Current smoker -Smoking cessation discussed   Continue all meds Labs pending Health Maintenance reviewed Diet and exercise encouraged RTO 6 months  Evelina Dun, FNP

## 2016-01-04 NOTE — Patient Instructions (Signed)

## 2016-01-05 ENCOUNTER — Other Ambulatory Visit: Payer: Self-pay | Admitting: Family

## 2016-01-05 LAB — CMP14+EGFR
ALT: 12 IU/L (ref 0–32)
AST: 13 IU/L (ref 0–40)
Albumin/Globulin Ratio: 2 (ref 1.2–2.2)
Albumin: 4.5 g/dL (ref 3.6–4.8)
Alkaline Phosphatase: 84 IU/L (ref 39–117)
BUN/Creatinine Ratio: 17 (ref 12–28)
BUN: 9 mg/dL (ref 8–27)
Bilirubin Total: 0.3 mg/dL (ref 0.0–1.2)
CO2: 25 mmol/L (ref 18–29)
Calcium: 9.6 mg/dL (ref 8.7–10.3)
Chloride: 100 mmol/L (ref 96–106)
Creatinine, Ser: 0.52 mg/dL — ABNORMAL LOW (ref 0.57–1.00)
GFR calc Af Amer: 119 mL/min/{1.73_m2} (ref >59)
GFR calc non Af Amer: 103 mL/min/{1.73_m2} (ref >59)
Globulin, Total: 2.2 g/dL (ref 1.5–4.5)
Glucose: 80 mg/dL (ref 65–99)
Potassium: 4.8 mmol/L (ref 3.5–5.2)
Sodium: 138 mmol/L (ref 134–144)
Total Protein: 6.7 g/dL (ref 6.0–8.5)

## 2016-01-05 LAB — LIPID PANEL
Chol/HDL Ratio: 5.8 ratio units — ABNORMAL HIGH (ref 0.0–4.4)
Cholesterol, Total: 251 mg/dL — ABNORMAL HIGH (ref 100–199)
HDL: 43 mg/dL (ref >39)
LDL Calculated: 159 mg/dL — ABNORMAL HIGH (ref 0–99)
Triglycerides: 245 mg/dL — ABNORMAL HIGH (ref 0–149)
VLDL Cholesterol Cal: 49 mg/dL — ABNORMAL HIGH (ref 5–40)

## 2016-01-05 LAB — THYROID PANEL WITH TSH
Free Thyroxine Index: 1.4 (ref 1.2–4.9)
T3 Uptake Ratio: 22 % — ABNORMAL LOW (ref 24–39)
T4, Total: 6.3 ug/dL (ref 4.5–12.0)
TSH: 6.33 u[IU]/mL — ABNORMAL HIGH (ref 0.450–4.500)

## 2016-01-05 MED ORDER — LEVOTHYROXINE SODIUM 175 MCG PO TABS: 175.0000 ug | ORAL_TABLET | Freq: Every day | ORAL | 1 refills | Status: DC

## 2016-01-05 MED ORDER — ATORVASTATIN CALCIUM 20 MG PO TABS: 20.0000 mg | ORAL_TABLET | Freq: Every day | ORAL | 3 refills | Status: DC

## 2016-01-17 HISTORY — PX: KNEE SURGERY: SHX244

## 2016-04-04 ENCOUNTER — Other Ambulatory Visit: Payer: Self-pay | Admitting: Family

## 2016-04-04 DIAGNOSIS — F411 Generalized anxiety disorder: Secondary | ICD-10-CM

## 2016-04-04 DIAGNOSIS — B0089 Other herpesviral infection: Secondary | ICD-10-CM

## 2016-04-04 DIAGNOSIS — F331 Major depressive disorder, recurrent, moderate: Secondary | ICD-10-CM

## 2016-04-07 ENCOUNTER — Encounter: Payer: Self-pay | Admitting: Nurse Practitioner

## 2016-04-07 ENCOUNTER — Ambulatory Visit (INDEPENDENT_AMBULATORY_CARE_PROVIDER_SITE_OTHER): Payer: BLUE CROSS/BLUE SHIELD | Admitting: Nurse Practitioner

## 2016-04-07 VITALS — BP 133/79 | HR 87 | Temp 97.4°F | Ht 64.0 in | Wt 176.0 lb

## 2016-04-07 DIAGNOSIS — Z0000 Encounter for general adult medical examination without abnormal findings: Secondary | ICD-10-CM

## 2016-04-07 DIAGNOSIS — E669 Obesity, unspecified: Secondary | ICD-10-CM

## 2016-04-07 DIAGNOSIS — E034 Atrophy of thyroid (acquired): Secondary | ICD-10-CM

## 2016-04-07 DIAGNOSIS — E785 Hyperlipidemia, unspecified: Secondary | ICD-10-CM

## 2016-04-07 DIAGNOSIS — Z01419 Encounter for gynecological examination (general) (routine) without abnormal findings: Secondary | ICD-10-CM

## 2016-04-07 DIAGNOSIS — K219 Gastro-esophageal reflux disease without esophagitis: Secondary | ICD-10-CM

## 2016-04-07 DIAGNOSIS — F331 Major depressive disorder, recurrent, moderate: Secondary | ICD-10-CM

## 2016-04-07 DIAGNOSIS — F411 Generalized anxiety disorder: Secondary | ICD-10-CM

## 2016-04-07 LAB — URINALYSIS, COMPLETE
Bilirubin, UA: NEGATIVE
Glucose, UA: NEGATIVE
Leukocytes, UA: NEGATIVE
Nitrite, UA: NEGATIVE
Protein, UA: NEGATIVE
RBC, UA: NEGATIVE
Specific Gravity, UA: 1.025 (ref 1.005–1.030)
Urobilinogen, Ur: 0.2 mg/dL (ref 0.2–1.0)
pH, UA: 5 (ref 5.0–7.5)

## 2016-04-07 LAB — MICROSCOPIC EXAMINATION
RBC, UA: NONE SEEN /hpf (ref 0–?)
Renal Epithel, UA: NONE SEEN /hpf
WBC, UA: NONE SEEN /hpf (ref 0–?)

## 2016-04-07 MED ORDER — OMEPRAZOLE 40 MG PO CPDR: 40.0000 mg | DELAYED_RELEASE_CAPSULE | Freq: Every day | ORAL | 1 refills | Status: DC

## 2016-04-07 MED ORDER — LEVOTHYROXINE SODIUM 175 MCG PO TABS: 175.0000 ug | ORAL_TABLET | Freq: Every day | ORAL | 1 refills | Status: DC

## 2016-04-07 MED ORDER — BUSPIRONE HCL 15 MG PO TABS
15.0000 mg | ORAL_TABLET | Freq: Three times a day (TID) | ORAL | 1 refills | Status: DC
Start: 2016-04-07 — End: 2017-01-25

## 2016-04-07 MED ORDER — FLUOXETINE HCL 40 MG PO CAPS: 40.0000 mg | ORAL_CAPSULE | Freq: Every day | ORAL | 1 refills | Status: DC

## 2016-04-07 MED ORDER — ATORVASTATIN CALCIUM 20 MG PO TABS: 20.0000 mg | ORAL_TABLET | Freq: Every day | ORAL | 3 refills | Status: DC

## 2016-04-07 NOTE — Progress Notes (Signed)
Subjective:    Patient ID: Heather [name], female    DOB: [birth year], 61 y.o.   MRN: [id number]  Patient here today for annual physical exam, PAP and follow up of chronic medical problems. She is doing well today without complaints.  Outpatient Encounter Prescriptions as of 04/07/2016  Medication Sig  . atorvastatin (LIPITOR) 20 MG tablet Take 1 tablet (20 mg total) by mouth daily.  . busPIRone (BUSPAR) 15 MG tablet take 1  Tablet by mouth 3 times daily  . FLUoxetine (PROZAC) 40 MG capsule Take 1 Capsule by mouth once daily  . levothyroxine (SYNTHROID, LEVOTHROID) 175 MCG tablet Take 1 tablet (175 mcg total) by mouth daily before breakfast.  . omeprazole (PRILOSEC) 40 MG capsule Take 1 capsule (40 mg total) by mouth daily.  Marland Kitchen OVER THE COUNTER MEDICATION Coconut oil pill  . valACYclovir (VALTREX) 1000 MG tablet Take 2 tablets 2 times a day for 1 day at fever blister onset   Gastroesophageal Reflux  She complains of a hoarse voice. She reports no chest pain, no coughing or no heartburn. This is a chronic problem. The current episode started more than 1 year ago. The problem occurs rarely. The problem has been resolved. The symptoms are aggravated by lying down and smoking. Risk factors include smoking/tobacco exposure. She has tried a PPI for the symptoms. The treatment provided significant relief.  Hyperlipidemia  This is a chronic problem. The current episode started more than 1 year ago. The problem is uncontrolled. Recent lipid tests were reviewed and are high. Exacerbating diseases include hypothyroidism and obesity. Factors aggravating her hyperlipidemia include smoking. Pertinent negatives include no chest pain. Current antihyperlipidemic treatment includes diet change. The current treatment provides mild improvement of lipids. Risk factors for coronary artery disease include family history, dyslipidemia, hypertension, obesity and a sedentary lifestyle.  Depression         This is a chronic  problem.  The current episode started more than 1 year ago.   The onset quality is gradual.   The problem occurs rarely.  The problem has been resolved since onset.  Associated symptoms include no decreased concentration, no helplessness, no hopelessness, not irritable, no restlessness and not sad.     The symptoms are aggravated by family issues.  Past treatments include SSRIs - Selective serotonin reuptake inhibitors.  Compliance with treatment is good.  Past medical history includes hypothyroidism, thyroid problem and anxiety.   Anxiety  Presents for follow-up visit. Symptoms include excessive worry and nervous/anxious behavior. Patient reports no chest pain, decreased concentration, depressed mood, irritability, panic or restlessness. Symptoms occur occasionally.    Thyroid Problem  Presents for follow-up visit. Symptoms include anxiety, dry skin and hoarse voice. Patient reports no constipation, depressed mood or diarrhea. The symptoms have been stable. Her past medical history is significant for hyperlipidemia.  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     Review of Systems  Constitutional: Negative for irritability.  HENT: Positive for hoarse voice.   Respiratory: Negative for cough.   Cardiovascular: Negative for chest pain.  Gastrointestinal: Negative for constipation, diarrhea and heartburn.  Psychiatric/Behavioral: Positive for depression. Negative for decreased concentration. The patient is nervous/anxious.   All other systems reviewed and are negative.      Objective:   Physical Exam  Constitutional: She is oriented to person, place, and time. She appears well-developed and well-nourished. She is not irritable. No distress.  HENT:  Head: Normocephalic and atraumatic.  Right Ear: Hearing, tympanic membrane, external ear and ear canal normal.  Left Ear: Hearing, tympanic membrane, external ear and ear canal normal.  Nose:  Nose normal.  Mouth/Throat: Uvula is midline and oropharynx is clear and moist.  Eyes: Conjunctivae and EOM are normal. Pupils are equal, round, and reactive to Neis.  Neck: Normal range of motion and full passive range of motion without pain. Neck supple. No JVD present. Carotid bruit is not present. No thyroid mass and no thyromegaly present.  Cardiovascular: Normal rate, regular rhythm, normal heart sounds and intact distal pulses.   No murmur heard. Pulmonary/Chest: Effort normal and breath sounds normal. No respiratory distress. She has no wheezes. Right breast exhibits no inverted nipple, no mass, no nipple discharge, no skin change and no tenderness. Left breast exhibits no inverted nipple, no mass, no nipple discharge, no skin change and no tenderness.  Abdominal: Soft. Bowel sounds are normal. She exhibits no distension and no mass. There is no tenderness.  Genitourinary: Vagina normal and uterus normal. No breast swelling, tenderness, discharge or bleeding.  Genitourinary Comments: bimanual exam-No adnexal masses or tenderness. vaginal cuff intact- with slight cystocele  Musculoskeletal: Normal range of motion. She exhibits no edema or tenderness.  Lymphadenopathy:    She has no cervical adenopathy.  Neurological: She is alert and oriented to person, place, and time.  Skin: Skin is warm and dry.  Psychiatric: She has a normal mood and affect. Her behavior is normal. Judgment and thought content normal.  Vitals reviewed.     BP 133/79   Pulse 87   Temp 97.4 F (36.3 C) (Oral)   Ht _0  (1.626 m)   Wt 176 lb (79.8 kg)   BMI 30.21 kg/m      Assessment & Plan:  1. Annual physical exam - Urinalysis, Complete - CBC with Differential/Platelet  2. Moderate episode of recurrent major depressive disorder (HCC) Stress management - FLUoxetine (PROZAC) 40 MG capsule; Take 1 capsule (40 mg total) by mouth daily.  Dispense: 90 capsule; Refill: 1  3. GAD (generalized anxiety  disorder) - busPIRone (BUSPAR) 15 MG tablet; Take 1 tablet (15 mg total) by mouth 3 (three) times daily.  Dispense: 90 tablet; Refill: 1  4. Gastroesophageal reflux disease without esophagitis Avoid spicy foods Do not eat 2 hours prior to bedtime - omeprazole (PRILOSEC) 40 MG capsule; Take 1 capsule (40 mg total) by mouth daily.  Dispense: 90 capsule; Refill: 1  5. Gynecologic exam normal - IGP, Aptima HPV, rfx 16/18,45  6. Hypothyroidism due to acquired atrophy of thyroid - Thyroid Panel With TSH - levothyroxine (SYNTHROID, LEVOTHROID) 175 MCG tablet; Take 1 tablet (175 mcg total) by mouth daily before breakfast.  Dispense: 90 tablet; Refill: 1  7. Hyperlipidemia with target LDL less than 100 Low fat diet - CMP14+EGFR - Lipid panel - atorvastatin (LIPITOR) 20 MG tablet; Take 1 tablet (20 mg total) by mouth daily.  Dispense: 90 tablet; Refill: 3  8. Obesity (BMI 30-39.9) Discussed diet and exercise for person with BMI >25 Will recheck weight in 3-6 months    Labs pending Health maintenance reviewed Diet and exercise encouraged Continue all meds Follow up  In 6 months   [place], FNP

## 2016-04-07 NOTE — Patient Instructions (Signed)
Health Maintenance, Female Adopting a healthy lifestyle and getting preventive care can go a long way to promote health and wellness. Talk with your health care provider about what schedule of regular examinations is right for you. This is a good chance for you to check in with your provider about disease prevention and staying healthy. In between checkups, there are plenty of things you can do on your own. Experts have done a lot of research about which lifestyle changes and preventive measures are most likely to keep you healthy. Ask your health care provider for more information. Weight and diet Eat a healthy diet  Be sure to include plenty of vegetables, fruits, low-fat dairy products, and lean protein.  Do not eat a lot of foods high in solid fats, added sugars, or salt.  Get regular exercise. This is one of the most important things you can do for your health.  Most adults should exercise for at least 150 minutes each week. The exercise should increase your heart rate and make you sweat (moderate-intensity exercise).  Most adults should also do strengthening exercises at least twice a week. This is in addition to the moderate-intensity exercise. Maintain a healthy weight  Body mass index (BMI) is a measurement that can be used to identify possible weight problems. It estimates body fat based on height and weight. Your health care provider can help determine your BMI and help you achieve or maintain a healthy weight.  For females 62 years of age and older:  A BMI below 18.5 is considered underweight.  A BMI of 18.5 to 24.9 is normal.  A BMI of 25 to 29.9 is considered overweight.  A BMI of 30 and above is considered obese. Watch levels of cholesterol and blood lipids  You should start having your blood tested for lipids and cholesterol at 62 years of age, then have this test every 5 years.  You may need to have your cholesterol levels checked more often if:  Your lipid or  cholesterol levels are high.  You are older than 62 years of age.  You are at high risk for heart disease. Cancer screening Lung Cancer  Lung cancer screening is recommended for adults 62-27 years old who are at high risk for lung cancer because of a history of smoking.  A yearly low-dose CT scan of the lungs is recommended for people who:  Currently smoke.  Have quit within the past 15 years.  Have at least a 30-pack-year history of smoking. A pack year is smoking an average of one pack of cigarettes a day for 1 year.  Yearly screening should continue until it has been 15 years since you quit.  Yearly screening should stop if you develop a health problem that would prevent you from having lung cancer treatment. Breast Cancer  Practice breast self-awareness. This means understanding how your breasts normally appear and feel.  It also means doing regular breast self-exams. Let your health care provider know about any changes, no matter how small.  If you are in your 62s or 30s, you should have a clinical breast exam (CBE) by a health care provider every 1-3 years as part of a regular health exam.  If you are 62 or older, have a CBE every year. Also consider having a breast X-ray (mammogram) every year.  If you have a family history of breast cancer, talk to your health care provider about genetic screening.  If you are at high risk for breast cancer, talk  to your health care provider about having an MRI and a mammogram every year.  Breast cancer gene (BRCA) assessment is recommended for women who have family members with BRCA-related cancers. BRCA-related cancers include:  Breast.  Ovarian.  Tubal.  Peritoneal cancers.  Results of the assessment will determine the need for genetic counseling and BRCA1 and BRCA2 testing. Cervical Cancer  Your health care provider may recommend that you be screened regularly for cancer of the pelvic organs (ovaries, uterus, and vagina).  This screening involves a pelvic examination, including checking for microscopic changes to the surface of your cervix (Pap test). You may be encouraged to have this screening done every 3 years, beginning at age 24.  For women ages 66-65, health care providers may recommend pelvic exams and Pap testing every 3 years, or they may recommend the Pap and pelvic exam, combined with testing for human papilloma virus (HPV), every 5 years. Some types of HPV increase your risk of cervical cancer. Testing for HPV may also be done on women of any age with unclear Pap test results.  Other health care providers may not recommend any screening for nonpregnant women who are considered low risk for pelvic cancer and who do not have symptoms. Ask your health care provider if a screening pelvic exam is right for you.  If you have had past treatment for cervical cancer or a condition that could lead to cancer, you need Pap tests and screening for cancer for at least 20 years after your treatment. If Pap tests have been discontinued, your risk factors (such as having a new sexual partner) need to be reassessed to determine if screening should resume. Some women have medical problems that increase the chance of getting cervical cancer. In these cases, your health care provider may recommend more frequent screening and Pap tests. Colorectal Cancer  This type of cancer can be detected and often prevented.  Routine colorectal cancer screening usually begins at 62 years of age and continues through 62 years of age.  Your health care provider may recommend screening at an earlier age if you have risk factors for colon cancer.  Your health care provider may also recommend using home test kits to check for hidden blood in the stool.  A small camera at the end of a tube can be used to examine your colon directly (sigmoidoscopy or colonoscopy). This is done to check for the earliest forms of colorectal cancer.  Routine  screening usually begins at age 62.  Direct examination of the colon should be repeated every 5-10 years through 62 years of age. However, you may need to be screened more often if early forms of precancerous polyps or small growths are found. Skin Cancer  Check your skin from head to toe regularly.  Tell your health care provider about any new moles or changes in moles, especially if there is a change in a mole's shape or color.  Also tell your health care provider if you have a mole that is larger than the size of a pencil eraser.  Always use sunscreen. Apply sunscreen liberally and repeatedly throughout the day.  Protect yourself by wearing long sleeves, pants, a wide-brimmed hat, and sunglasses whenever you are outside. Heart disease, diabetes, and high blood pressure  High blood pressure causes heart disease and increases the risk of stroke. High blood pressure is more likely to develop in:  People who have blood pressure in the high end of the normal range (130-139/85-89 mm Hg).  People who are overweight or obese.  People who are African American.  If you are 75-74 years of age, have your blood pressure checked every 3-5 years. If you are 20 years of age or older, have your blood pressure checked every year. You should have your blood pressure measured twice-once when you are at a hospital or clinic, and once when you are not at a hospital or clinic. Record the average of the two measurements. To check your blood pressure when you are not at a hospital or clinic, you can use:  An automated blood pressure machine at a pharmacy.  A home blood pressure monitor.  If you are between 3 years and 31 years old, ask your health care provider if you should take aspirin to prevent strokes.  Have regular diabetes screenings. This involves taking a blood sample to check your fasting blood sugar level.  If you are at a normal weight and have a low risk for diabetes, have this test once  every three years after 62 years of age.  If you are overweight and have a high risk for diabetes, consider being tested at a younger age or more often. Preventing infection Hepatitis B  If you have a higher risk for hepatitis B, you should be screened for this virus. You are considered at high risk for hepatitis B if:  You were born in a country where hepatitis B is common. Ask your health care provider which countries are considered high risk.  Your parents were born in a high-risk country, and you have not been immunized against hepatitis B (hepatitis B vaccine).  You have HIV or AIDS.  You use needles to inject street drugs.  You live with someone who has hepatitis B.  You have had sex with someone who has hepatitis B.  You get hemodialysis treatment.  You take certain medicines for conditions, including cancer, organ transplantation, and autoimmune conditions. Hepatitis C  Blood testing is recommended for:  Everyone born from [birth year] through 1966.  Anyone with known risk factors for hepatitis C. Sexually transmitted infections (STIs)  You should be screened for sexually transmitted infections (STIs) including gonorrhea and chlamydia if:  You are sexually active and are younger than 62 years of age.  You are older than 63 years of age and your health care provider tells you that you are at risk for this type of infection.  Your sexual activity has changed since you were last screened and you are at an increased risk for chlamydia or gonorrhea. Ask your health care provider if you are at risk.  If you do not have HIV, but are at risk, it may be recommended that you take a prescription medicine daily to prevent HIV infection. This is called pre-exposure prophylaxis (PrEP). You are considered at risk if:  You are sexually active and do not regularly use condoms or know the HIV status of your partner(s).  You take drugs by injection.  You are sexually active with a partner  who has HIV. Talk with your health care provider about whether you are at high risk of being infected with HIV. If you choose to begin PrEP, you should first be tested for HIV. You should then be tested every 3 months for as long as you are taking PrEP. Pregnancy  If you are premenopausal and you may become pregnant, ask your health care provider about preconception counseling.  If you may become pregnant, take 400 to 800 micrograms (mcg) of folic acid  every day.  If you want to prevent pregnancy, talk to your health care provider about birth control (contraception). Osteoporosis and menopause  Osteoporosis is a disease in which the bones lose minerals and strength with aging. This can result in serious bone fractures. Your risk for osteoporosis can be identified using a bone density scan.  If you are 4 years of age or older, or if you are at risk for osteoporosis and fractures, ask your health care provider if you should be screened.  Ask your health care provider whether you should take a calcium or vitamin D supplement to lower your risk for osteoporosis.  Menopause may have certain physical symptoms and risks.  Hormone replacement therapy may reduce some of these symptoms and risks. Talk to your health care provider about whether hormone replacement therapy is right for you. Follow these instructions at home:  Schedule regular health, dental, and eye exams.  Stay current with your immunizations.  Do not use any tobacco products including cigarettes, chewing tobacco, or electronic cigarettes.  If you are pregnant, do not drink alcohol.  If you are breastfeeding, limit how much and how often you drink alcohol.  Limit alcohol intake to no more than 1 drink per day for nonpregnant women. One drink equals 12 ounces of beer, 5 ounces of wine, or 1 ounces of hard liquor.  Do not use street drugs.  Do not share needles.  Ask your health care provider for help if you need support  or information about quitting drugs.  Tell your health care provider if you often feel depressed.  Tell your health care provider if you have ever been abused or do not feel safe at home. This information is not intended to replace advice given to you by your health care provider. Make sure you discuss any questions you have with your health care provider. Document Released: 07/18/2010 Document Revised: 06/10/2015 Document Reviewed: 10/06/2014 Elsevier Interactive Patient Education  2017 [employer].

## 2016-04-08 LAB — CMP14+EGFR
ALT: 12 IU/L (ref 0–32)
AST: 12 IU/L (ref 0–40)
Albumin/Globulin Ratio: 1.8 (ref 1.2–2.2)
Albumin: 4.3 g/dL (ref 3.6–4.8)
Alkaline Phosphatase: 88 IU/L (ref 39–117)
BUN/Creatinine Ratio: 25 (ref 12–28)
BUN: 14 mg/dL (ref 8–27)
Bilirubin Total: <0.2 mg/dL (ref 0.0–1.2)
CO2: 22 mmol/L (ref 18–29)
Calcium: 9.9 mg/dL (ref 8.7–10.3)
Chloride: 102 mmol/L (ref 96–106)
Creatinine, Ser: 0.57 mg/dL (ref 0.57–1.00)
GFR calc Af Amer: 116 mL/min/{1.73_m2} (ref >59)
GFR calc non Af Amer: 100 mL/min/{1.73_m2} (ref >59)
Globulin, Total: 2.4 g/dL (ref 1.5–4.5)
Glucose: 79 mg/dL (ref 65–99)
Potassium: 4.6 mmol/L (ref 3.5–5.2)
Sodium: 140 mmol/L (ref 134–144)
Total Protein: 6.7 g/dL (ref 6.0–8.5)

## 2016-04-08 LAB — CBC WITH DIFFERENTIAL/PLATELET
Basophils Absolute: 0 10*3/uL (ref 0.0–0.2)
Basos: 0 %
EOS (ABSOLUTE): 0.3 10*3/uL (ref 0.0–0.4)
Eos: 4 %
Hematocrit: 38.1 % (ref 34.0–46.6)
Hemoglobin: 13.5 g/dL (ref 11.1–15.9)
Immature Grans (Abs): 0 10*3/uL (ref 0.0–0.1)
Immature Granulocytes: 0 %
Lymphocytes Absolute: 2.7 10*3/uL (ref 0.7–3.1)
Lymphs: 39 %
MCH: 30.5 pg (ref 26.6–33.0)
MCHC: 35.4 g/dL (ref 31.5–35.7)
MCV: 86 fL (ref 79–97)
Monocytes Absolute: 0.6 10*3/uL (ref 0.1–0.9)
Monocytes: 8 %
Neutrophils Absolute: 3.3 10*3/uL (ref 1.4–7.0)
Neutrophils: 49 %
Platelets: 383 10*3/uL — ABNORMAL HIGH (ref 150–379)
RBC: 4.43 x10E6/uL (ref 3.77–5.28)
RDW: 13 % (ref 12.3–15.4)
WBC: 6.8 10*3/uL (ref 3.4–10.8)

## 2016-04-08 LAB — LIPID PANEL
Chol/HDL Ratio: 5.5 ratio units — ABNORMAL HIGH (ref 0.0–4.4)
Cholesterol, Total: 198 mg/dL (ref 100–199)
HDL: 36 mg/dL — ABNORMAL LOW (ref >39)
LDL Calculated: 95 mg/dL (ref 0–99)
Triglycerides: 335 mg/dL — ABNORMAL HIGH (ref 0–149)
VLDL Cholesterol Cal: 67 mg/dL — ABNORMAL HIGH (ref 5–40)

## 2016-04-08 LAB — THYROID PANEL WITH TSH
Free Thyroxine Index: 3 (ref 1.2–4.9)
T3 Uptake Ratio: 30 % (ref 24–39)
T4, Total: 10.1 ug/dL (ref 4.5–12.0)
TSH: 0.007 u[IU]/mL — ABNORMAL LOW (ref 0.450–4.500)

## 2016-04-10 ENCOUNTER — Other Ambulatory Visit: Payer: Self-pay | Admitting: Nurse Practitioner

## 2016-04-10 MED ORDER — LEVOTHYROXINE SODIUM 150 MCG PO TABS: 150.0000 ug | ORAL_TABLET | Freq: Every day | ORAL | 5 refills | Status: DC

## 2016-04-10 MED ORDER — FENOFIBRATE 160 MG PO TABS
160.0000 mg | ORAL_TABLET | Freq: Every day | ORAL | 5 refills | Status: DC
Start: 2016-04-10 — End: 2016-09-15

## 2016-04-11 LAB — IGP, APTIMA HPV, RFX 16/18,45
HPV Aptima: NEGATIVE
PAP Smear Comment: 0

## 2016-08-14 ENCOUNTER — Encounter: Payer: BLUE CROSS/BLUE SHIELD | Admitting: *Deleted

## 2016-09-04 ENCOUNTER — Other Ambulatory Visit: Payer: Self-pay | Admitting: Nurse Practitioner

## 2016-09-05 NOTE — Telephone Encounter (Signed)
See last labs

## 2016-09-06 ENCOUNTER — Ambulatory Visit (INDEPENDENT_AMBULATORY_CARE_PROVIDER_SITE_OTHER): Payer: BLUE CROSS/BLUE SHIELD | Admitting: Family Medicine

## 2016-09-06 ENCOUNTER — Encounter: Payer: Self-pay | Admitting: Family Medicine

## 2016-09-06 ENCOUNTER — Ambulatory Visit (INDEPENDENT_AMBULATORY_CARE_PROVIDER_SITE_OTHER): Payer: BLUE CROSS/BLUE SHIELD

## 2016-09-06 VITALS — BP 134/73 | HR 73 | Temp 97.7°F | Ht 64.0 in | Wt 178.0 lb

## 2016-09-06 DIAGNOSIS — M1710 Unilateral primary osteoarthritis, unspecified knee: Secondary | ICD-10-CM

## 2016-09-06 DIAGNOSIS — M25561 Pain in right knee: Secondary | ICD-10-CM

## 2016-09-06 DIAGNOSIS — M1711 Unilateral primary osteoarthritis, right knee: Secondary | ICD-10-CM | POA: Diagnosis not present

## 2016-09-06 DIAGNOSIS — M7121 Synovial cyst of popliteal space [Baker], right knee: Secondary | ICD-10-CM | POA: Diagnosis not present

## 2016-09-06 MED ORDER — IBUPROFEN 800 MG PO TABS: 800.0000 mg | ORAL_TABLET | Freq: Three times a day (TID) | ORAL | 0 refills | Status: DC | PRN

## 2016-09-06 NOTE — Telephone Encounter (Signed)
Lmtcb.

## 2016-09-06 NOTE — Progress Notes (Signed)
Chief Complaint  Patient presents with  . Knee Pain    pt here today c/o right knee pain for the past month but has recently gotten worse    HPI  Patient presents today for Insidious onset of pain in the right knee. It hurts primarily when she walks and she has 12 hour shifts on her feet at work. The pain started off as just a little bit every now and then now it is moderately severe 6-7/10 all the time except it gets better when she lays down. She was off work the last 2 days prior to today. Unfortunately there was not enough improvement to allow her to go back to work. Therefore she is missing work today.  PMH: Smoking status noted ROS: Per HPI  Objective: BP 134/73   Pulse 73   Temp 97.7 F (36.5 C) (Oral)   Ht 5\' 4"  (1.626 m)   Wt 178 lb (80.7 kg)   BMI 30.55 kg/m  Gen: NAD, alert, cooperative with exam HEENT: NCAT, EOMI, PERRL CV: RRR, good S1/S2, no murmur Resp: CTABL, no wheezes, non-labored Abd: SNTND, BS present, no guarding or organomegaly Ext: No edema, warm. Baker's cyst noted posterior right knee./Popliteal fossa. Mild to moderate effusion. Full range of motion mildly tender passively. Neuro: Alert and oriented, No gross deficits Knee x-ray shows medial compartment DJD. Assessment and plan:  1. Right knee pain, unspecified chronicity   2. Arthritis of knee   3. Baker's cyst of knee, right     Meds ordered this encounter  Medications  . aspirin EC 81 MG tablet    Sig: Take 81 mg by mouth daily.  Marland Kitchen ibuprofen (ADVIL,MOTRIN) 800 MG tablet    Sig: Take 1 tablet (800 mg total) by mouth every 8 (eight) hours as needed.    Dispense:  90 tablet    Refill:  0    Orders Placed This Encounter  Procedures  . DG Knee 1-2 Views Right    Standing Status:   Future    Number of Occurrences:   1    Standing Expiration Date:   11/06/2017    Order Specific Question:   Reason for Exam (SYMPTOM  OR DIAGNOSIS REQUIRED)    Answer:   knee pain    Order Specific Question:    Preferred imaging location?    Answer:   Internal  . Ambulatory referral to Physical Therapy    Referral Priority:   Routine    Referral Type:   Physical Medicine    Referral Reason:   Specialty Services Required    Requested Specialty:   Physical Therapy    Number of Visits Requested:   1  A steroid injection was performed at the lateral joint line left knee using 1% plain Lidocaine and 9 mg of Celestone. This was well tolerated. Off work for the next 5 days then may return to work. Due to physical therapy she may have to occasionally miss a short time during her workday for therapy.  Follow up as needed.  Claretta Fraise, MD

## 2016-09-15 ENCOUNTER — Other Ambulatory Visit: Payer: Self-pay | Admitting: Nurse Practitioner

## 2016-09-29 ENCOUNTER — Encounter: Payer: Self-pay | Admitting: Family Medicine

## 2016-09-29 ENCOUNTER — Ambulatory Visit (INDEPENDENT_AMBULATORY_CARE_PROVIDER_SITE_OTHER): Payer: BLUE CROSS/BLUE SHIELD | Admitting: Family Medicine

## 2016-09-29 VITALS — BP 130/68 | HR 94 | Temp 97.4°F | Ht 64.0 in | Wt 178.0 lb

## 2016-09-29 DIAGNOSIS — M25561 Pain in right knee: Secondary | ICD-10-CM | POA: Diagnosis not present

## 2016-09-29 DIAGNOSIS — M7121 Synovial cyst of popliteal space [Baker], right knee: Secondary | ICD-10-CM

## 2016-09-29 MED ORDER — PREDNISONE 10 MG (21) PO TBPK: ORAL_TABLET | Freq: Every day | ORAL | 0 refills | Status: DC

## 2016-09-29 NOTE — Progress Notes (Signed)
Chief Complaint  Patient presents with  . Knee Pain    pt here today c/o right knee pain that hasn't gotten any better but is getting worse    HPI  Patient presents today for right knee pain that is worsening in spite of joint injection 3 weeks ago. No relief with OTC aleve. Pain becomes severe with working  3hours. Can not finish her 12 hour shifts.   PMH: Smoking status noted ROS: Per HPI  Objective: BP 130/68   Pulse 94   Temp (!) 97.4 F (36.3 C) (Oral)   Ht 5\' 4"  (1.626 m)   Wt 178 lb (80.7 kg)   BMI 30.55 kg/m  Gen: NAD, alert, cooperative with exam HEENT: NCAT, EOMI, PERRL CV: RRR, good S1/S2, no murmur Resp: CTABL, no wheezes, non-labored Ext: No edema, warm. The right  knee has full range of motion with discomfort for passive ROM. Gait is normal without a limp. The joint lines are tender anterolaterally. The patella is palpable without tenderness or edema.  Lachman / anterior drawer signs are negative for signs of instability and pain free. McMurray testing reveals no pop or excessive discomfort. Varus and valgus stress maneuvers cause pain without instability. Palpable cyst posteriorly. Neuro: Alert and oriented, No gross deficits  Assessment and plan:  1. Right knee pain, unspecified chronicity   2. Baker's cyst of knee, right     Meds ordered this encounter  Medications  . predniSONE (STERAPRED UNI-PAK 21 TAB) 10 MG (21) TBPK tablet    Sig: Take by mouth daily. As directed x 6 days    Dispense:  21 tablet    Refill:  0    Orders Placed This Encounter  Procedures  . MR KNEE RIGHT W WO CONTRAST    Standing Status:   Future    Standing Expiration Date:   11/29/2017    Order Specific Question:   If indicated for the ordered procedure, I authorize the administration of contrast media per Radiology protocol    Answer:   Yes    Order Specific Question:   What is the patient's sedation requirement?    Answer:   No Sedation    Order Specific Question:   Does  the patient have a pacemaker or implanted devices?    Answer:   No    Order Specific Question:   Radiology Contrast Protocol - do NOT remove file path    Answer:   \\[email or web address]    Order Specific Question:   Preferred imaging location?    Answer:   [facility] (table limit-350lbs)  . Ambulatory referral to Orthopedic Surgery    Referral Priority:   Routine    Referral Type:   Surgical    Referral Reason:   Specialty Services Required    Requested Specialty:   Orthopedic Surgery    Number of Visits Requested:   1    Follow up as needed.  Claretta Fraise, MD

## 2016-10-02 ENCOUNTER — Telehealth: Payer: Self-pay

## 2016-10-02 ENCOUNTER — Other Ambulatory Visit: Payer: Self-pay | Admitting: Nurse Practitioner

## 2016-10-02 DIAGNOSIS — K219 Gastro-esophageal reflux disease without esophagitis: Secondary | ICD-10-CM

## 2016-10-02 NOTE — Telephone Encounter (Signed)
Pt notified of recommenadation

## 2016-10-02 NOTE — Telephone Encounter (Signed)
Not having MRI till next Monday  Am I suppose to stay out of work that long?

## 2016-10-02 NOTE — Telephone Encounter (Signed)
Please contact the patient Stay out of work if pain continues

## 2016-10-09 ENCOUNTER — Ambulatory Visit (HOSPITAL_COMMUNITY): Admission: RE | Admit: 2016-10-09 | Payer: BLUE CROSS/BLUE SHIELD | Source: Ambulatory Visit

## 2016-10-09 ENCOUNTER — Other Ambulatory Visit: Payer: Self-pay

## 2016-10-09 ENCOUNTER — Ambulatory Visit (HOSPITAL_COMMUNITY)
Admission: RE | Admit: 2016-10-09 | Discharge: 2016-10-09 | Disposition: A | Discharge status: Home / Self Care | Payer: BLUE CROSS/BLUE SHIELD | Source: Ambulatory Visit | Attending: Family Medicine | Admitting: Family Medicine

## 2016-10-09 DIAGNOSIS — M25561 Pain in right knee: Secondary | ICD-10-CM

## 2016-10-09 DIAGNOSIS — X58XXXA Exposure to other specified factors, initial encounter: Secondary | ICD-10-CM | POA: Insufficient documentation

## 2016-10-09 DIAGNOSIS — S83241A Other tear of medial meniscus, current injury, right knee, initial encounter: Secondary | ICD-10-CM | POA: Diagnosis not present

## 2016-10-09 DIAGNOSIS — M7121 Synovial cyst of popliteal space [Baker], right knee: Secondary | ICD-10-CM | POA: Diagnosis not present

## 2016-10-10 ENCOUNTER — Telehealth: Payer: Self-pay

## 2016-10-10 NOTE — Telephone Encounter (Signed)
FYI, patient is aware of MRI results, she has already called [facility] and has an appointment with Dr. Stann Mainland this Friday.

## 2016-10-10 NOTE — Telephone Encounter (Signed)
Thanks! Will take care of it now. WS 

## 2016-10-18 ENCOUNTER — Ambulatory Visit (INDEPENDENT_AMBULATORY_CARE_PROVIDER_SITE_OTHER): Payer: BLUE CROSS/BLUE SHIELD | Admitting: Family Medicine

## 2016-10-18 ENCOUNTER — Telehealth: Payer: Self-pay | Admitting: Nurse Practitioner

## 2016-10-18 VITALS — BP 122/74 | HR 83 | Temp 98.7°F | Ht 64.0 in | Wt 175.0 lb

## 2016-10-18 DIAGNOSIS — F17200 Nicotine dependence, unspecified, uncomplicated: Secondary | ICD-10-CM

## 2016-10-18 DIAGNOSIS — Z01818 Encounter for other preprocedural examination: Secondary | ICD-10-CM

## 2016-10-18 NOTE — Progress Notes (Signed)
Pt is a 61 y.o. female who is here for preoperative evaluation for right knee surgery Monday with Dr Victorino December at [facility]  1) High Risk Cardiac Conditions  1) Recent MI - No.  2) Decompensated Heart Failure - No.  3) Unstable angina - No.  4) Symptomatic arrythmia - No.  5) Sx Valvular Disease - No.  2) Intermediate Risk Factors - DM, CKD, CVA, CHF, CAD - No.  2) Functional Status - > 4 mets (Walk, run, climb stairs) Yes.  9.6 METS. [facility] Activity Status Index: 56.5  3) Surgery Specific Risk -  Intermediate - orthopedic            4) Further Noninvasive evaluation -   1) EKG - No.  2) Echo - No.  3) Stress Testing - Active Cardiac Disease - No.  5) Need for medical therapy - Beta Blocker, Statins indicated ? No.  PE: Vitals:   10/18/16 1423 10/18/16 1425  BP: 140/86 122/74  Pulse: 83   Temp: 98.7 F (37.1 C)    Physical Examination: General appearance - alert, well appearing, and in no distress, oriented to person, place, and time, normal appearing weight and well hydrated Mental status - alert, oriented to person, place, and time Mouth - mucous membranes moist, pharynx normal without lesions and Mallampati 1 Neck - supple, no significant adenopathy Chest - clear to auscultation, no wheezes, rales or rhonchi, symmetric air entry Heart - normal rate, regular rhythm, normal S1, S2, no murmurs, rubs, clicks or gallops  No problem-specific Assessment & Plan notes found for this encounter.  I have independently evaluated patient.  SHANTIKA BERMEA is a 61 y.o. female who is low risk for an intermediate risk surgery.   There are modifiable risk factors (smoking). We reviewed surgical risks of continued tobacco use, including poor healing and death.  Tobacco cessation was recommended during this visit.   Tamkia Temples Carmon's RCRI calculation for MACE is: 0.4%.    Eulon Allnutt M. Lajuana Ripple, DO PGY-3, [facility]

## 2016-10-18 NOTE — Telephone Encounter (Signed)
appt scheduled Pt notified 

## 2016-10-30 ENCOUNTER — Other Ambulatory Visit: Payer: Self-pay | Admitting: Nurse Practitioner

## 2016-10-31 ENCOUNTER — Ambulatory Visit: Payer: BLUE CROSS/BLUE SHIELD | Attending: Orthopedic Surgery | Admitting: Physical Therapy

## 2016-10-31 ENCOUNTER — Encounter: Payer: Self-pay | Admitting: Physical Therapy

## 2016-10-31 DIAGNOSIS — G8929 Other chronic pain: Secondary | ICD-10-CM | POA: Insufficient documentation

## 2016-10-31 DIAGNOSIS — M6281 Muscle weakness (generalized): Secondary | ICD-10-CM | POA: Insufficient documentation

## 2016-10-31 DIAGNOSIS — M25561 Pain in right knee: Secondary | ICD-10-CM | POA: Insufficient documentation

## 2016-10-31 NOTE — Therapy (Signed)
[facility] [place], [place], [address] Phone: [phone]   Fax:  [phone]  Physical Therapy Evaluation  Patient Details  Name: Heather [name] MRN: [id number] Date of Birth: 01-16-1956 Referring Provider: Nicholes Stairs MD  Encounter Date: 10/31/2016      PT End of Session - 10/31/16 1408    Visit Number 1   Number of Visits 12   Date for PT Re-Evaluation 11/28/16   PT Start Time 0100   PT Stop Time 0149   PT Time Calculation (min) 49 min   Activity Tolerance Patient tolerated treatment well   Behavior During Therapy [facility] for tasks assessed/performed      Past Medical History:  Diagnosis Date  . Anxiety   . Cataract   . Depression   . Fibromyalgia   . GERD (gastroesophageal reflux disease)   . Hyperlipidemia    no meds currently  . Hypothyroidism   . PONV (postoperative nausea and vomiting)   . Thyroid disease    hypo    Past Surgical History:  Procedure Laterality Date  . CATARACT EXTRACTION Left   . CATARACT EXTRACTION W/PHACO Right 12/29/2013   Procedure: CATARACT EXTRACTION PHACO AND INTRAOCULAR LENS PLACEMENT (IOC);  Surgeon: Tonny Branch, MD;  Location: [facility];  Service: Ophthalmology;  Laterality: Right;  CDE:4.84  . EYE SURGERY     cleaned debris from behind eye.  . TUBAL LIGATION    . tubal ligation reversal    . VAGINAL HYSTERECTOMY      There were no vitals filed for this visit.       Subjective Assessment - 10/31/16 1323    Subjective The patient is s/p right medial arthroscopic medial meniscectomy performed on 10/23/16.  her current pain-level is an 8/10 increased with increased up time.  Rest decreases pain.   Limitations Walking   How long can you walk comfortably? Short community distances.   Patient Stated Goals Get out of pain.   Currently in Pain? Yes   Pain Score 8    Pain Location Knee   Pain Orientation Right   Pain Descriptors / Indicators Aching;Throbbing   Pain  Type Surgical pain   Pain Onset 1 to 4 weeks ago   Pain Frequency Constant   Aggravating Factors  See above.   Pain Relieving Factors See above.            [facility] PT Assessment - 10/31/16 0001      Assessment   Medical Diagnosis Right arthroscopic medial meniscectomy.   Referring Provider Nicholes Stairs MD   Onset Date/Surgical Date --  10/23/16 (surgery date).     Precautions   Precautions --  PAIN-FREE RT QUAD STRENTHENING.     Restrictions   Weight Bearing Restrictions No     Balance Screen   Has the patient fallen in the past 6 months No   Has the patient had a decrease in activity level because of a fear of falling?  No   Is the patient reluctant to leave their home because of a fear of falling?  No     Home Environment   Living Environment Private residence     Prior Function   Level of Independence Independent     Observation/Other Assessments   Observations Steri-strips over right knee scope incisional sites.     Observation/Other Assessments-Edema    Edema --  Minimal+ right knee edema.     ROM / Strength   AROM / PROM /  Strength AROM;Strength     AROM   Overall AROM Comments -12 degrees of active right knee extension in supine but able to achieve -5 degrees with gentle passive overpressure.  Active right knee flexion= 100 degrees.     Strength   Overall Strength Comments Right hip strength= 4-/5 and right knee graded grossly at 4-/5 with some decrease in volitional contraction of the right quadriceps muscle group.     Palpation   Palpation comment C/o diffuse right knee pain even with Maris palpation today.     Ambulation/Gait   Gait Comments Antalgic gait pattern with a decrease stance time over her right LE without an assistive device.            Objective measurements completed on examination: See above findings.          [facility] Adult PT Treatment/Exercise - 10/31/16 0001      Modalities   Modalities Electrical  Stimulation;Vasopneumatic     Electrical Stimulation   Electrical Stimulation Location Right knee.   Electrical Stimulation Action IFC   Electrical Stimulation Parameters 80-150 Hz x 15 minutes.   Electrical Stimulation Goals Pain     Vasopneumatic   Number Minutes Vasopneumatic  20 minutes   Vasopnuematic Location  --  Right knee.   Vasopneumatic Pressure Low                             Plan - 10/31/16 1416    Clinical Impression Statement The patient presents to [facility] s/p 8 days arthroscopic knee surgery for partial removal of her medial meniscus.  She is lacking range of motion.  Instructed patient in an HEP today with focus on extension.  She currently has a high pain-level with increased up time.  Her swelling is relatively minimal.  She has some decrease in volitional right quad activiation.  Her fucntional mobility is impaired currently.  The patient will benefit from skilled physical therapy to address deficts.   History and Personal Factors relevant to plan of care: Fibromyalgia.   Clinical Presentation Stable   Clinical Decision Making Low   Rehab Potential Excellent   PT Frequency 3x / week   PT Duration 4 weeks   PT Treatment/Interventions ADLs/Self Care Home Management;Electrical Stimulation;Cryotherapy;Gait training;Stair training;Functional mobility training;Therapeutic activities;Therapeutic exercise;Neuromuscular re-education;Patient/family education;Manual techniques;Vasopneumatic Device;Passive range of motion   PT Next Visit Plan Begin with Nustep then to stationary bike.  Progress to PAIN-FREE right quad strenthening.  E'stim/Vasopneumatic.   Consulted and Agree with Plan of Care Patient      Patient will benefit from skilled therapeutic intervention in order to improve the following deficits and impairments:  Abnormal gait, Decreased mobility, Decreased range of motion, Decreased strength, Increased edema, Pain  Visit Diagnosis: Chronic pain  of right knee - Plan: PT plan of care cert/re-cert  Muscle weakness (generalized) - Plan: PT plan of care cert/re-cert      G-Codes - 89/38/10 1409    Functional Assessment Tool Used (Outpatient Only) FOTO...60% limitation.   Functional Limitation Mobility: Walking and moving around   Mobility: Walking and Moving Around Current Status [phone]) At least 40 percent but less than 60 percent impaired, limited or restricted   Mobility: Walking and Moving Around Goal Status [phone]) At least 1 percent but less than 20 percent impaired, limited or restricted       Problem List Patient Active Problem List   Diagnosis Date Noted  . GAD (generalized anxiety disorder) 01/04/2016  .  Recurrent oral herpes simplex infection 01/04/2016  . Current smoker 01/04/2016  . Obesity (BMI 30-39.9) 10/30/2013  . GERD (gastroesophageal reflux disease) 09/27/2012  . Hyperlipidemia with target LDL less than 100 09/27/2012  . Depression 09/03/2012  . Hypothyroidism 09/03/2012    Dylann Gallier, [place] MPT 10/31/2016, 2:23 PM  [facility] [address] [place], [place], [address] Phone: [phone]   Fax:  [phone]  Name: Heather [name] MRN: [id number] Date of Birth: Sep 06, 1955

## 2016-11-02 ENCOUNTER — Ambulatory Visit: Payer: BLUE CROSS/BLUE SHIELD | Admitting: *Deleted

## 2016-11-02 DIAGNOSIS — M6281 Muscle weakness (generalized): Secondary | ICD-10-CM

## 2016-11-02 DIAGNOSIS — M25561 Pain in right knee: Secondary | ICD-10-CM | POA: Diagnosis not present

## 2016-11-02 DIAGNOSIS — G8929 Other chronic pain: Secondary | ICD-10-CM

## 2016-11-02 NOTE — Therapy (Signed)
[facility] [place], [place], [address] Phone: [phone]   Fax:  [phone]  Physical Therapy Treatment  Patient Details  Name: Heather [name] MRN: [id number] Date of Birth: January 10, 1956 Referring Provider: Nicholes Stairs MD  Encounter Date: 11/02/2016      PT End of Session - 11/02/16 1606    Visit Number 2   Number of Visits 12   Date for PT Re-Evaluation 11/28/16   PT Start Time 7858   PT Stop Time 1607   PT Time Calculation (min) 52 min      Past Medical History:  Diagnosis Date  . Anxiety   . Cataract   . Depression   . Fibromyalgia   . GERD (gastroesophageal reflux disease)   . Hyperlipidemia    no meds currently  . Hypothyroidism   . PONV (postoperative nausea and vomiting)   . Thyroid disease    hypo    Past Surgical History:  Procedure Laterality Date  . CATARACT EXTRACTION Left   . CATARACT EXTRACTION W/PHACO Right 12/29/2013   Procedure: CATARACT EXTRACTION PHACO AND INTRAOCULAR LENS PLACEMENT (IOC);  Surgeon: Tonny Branch, MD;  Location: [facility];  Service: Ophthalmology;  Laterality: Right;  CDE:4.84  . EYE SURGERY     cleaned debris from behind eye.  . TUBAL LIGATION    . tubal ligation reversal    . VAGINAL HYSTERECTOMY      There were no vitals filed for this visit.      Subjective Assessment - 11/02/16 1520    Subjective The patient is s/p right medial arthroscopic medial meniscectomy performed on 10/23/16.  her current pain-level is an 8/10 increased with increased up time.  Rest decreases pain.   Limitations Walking   How long can you walk comfortably? Short community distances.   Currently in Pain? Yes   Pain Score 8    Pain Location Knee   Pain Orientation Right   Pain Descriptors / Indicators Aching;Throbbing   Pain Type Surgical pain   Pain Onset 1 to 4 weeks ago   Pain Frequency Constant                         [facility] Adult PT Treatment/Exercise -  11/02/16 0001      Ambulation/Gait   Gait Comments Antalgic gait pattern with a decrease stance time over her right LE without an assistive device.     Exercises   Exercises Knee/Hip     Knee/Hip Exercises: Seated   Long Arc Quad Right;3 sets;10 reps     Knee/Hip Exercises: Supine   Quad Sets Right;20 reps   Heel Slides AAROM;Right;3 sets;10 reps     Modalities   Modalities Location manager Stimulation Location Right knee. IFC x 15 mins 80-150hz    Electrical Stimulation Goals Pain     Vasopneumatic   Number Minutes Vasopneumatic  15 minutes   Vasopnuematic Location  Knee   Vasopneumatic Pressure Low   Vasopneumatic Temperature  36     Manual Therapy   Manual Therapy Joint mobilization;Passive ROM   Passive ROM Patella mobs and gentle PROM for flexion/ext RT knee supine                            Plan - 11/02/16 1607    Clinical Impression Statement Pt arrived today doing fairly well, but still with  high pain levels 7-8/10. She was able to perform Full revolutions on the bike today and perform sitting and supine exs for RT knee. Flexion to 108 degrees today. Normal modality responses today   Clinical Presentation Stable   Rehab Potential Excellent   PT Frequency 3x / week   PT Duration 4 weeks   PT Treatment/Interventions ADLs/Self Care Home Management;Electrical Stimulation;Cryotherapy;Gait training;Stair training;Functional mobility training;Therapeutic activities;Therapeutic exercise;Neuromuscular re-education;Patient/family education;Manual techniques;Vasopneumatic Device;Passive range of motion   PT Next Visit Plan Begin with Nustep then to stationary bike.  Progress to PAIN-FREE right quad strenthening.  E'stim/Vasopneumatic.   Consulted and Agree with Plan of Care Patient      Patient will benefit from skilled therapeutic intervention in order to improve the following deficits and  impairments:  Abnormal gait, Decreased mobility, Decreased range of motion, Decreased strength, Increased edema, Pain  Visit Diagnosis: Chronic pain of right knee  Muscle weakness (generalized)     Problem List Patient Active Problem List   Diagnosis Date Noted  . GAD (generalized anxiety disorder) 01/04/2016  . Recurrent oral herpes simplex infection 01/04/2016  . Current smoker 01/04/2016  . Obesity (BMI 30-39.9) 10/30/2013  . GERD (gastroesophageal reflux disease) 09/27/2012  . Hyperlipidemia with target LDL less than 100 09/27/2012  . Depression 09/03/2012  . Hypothyroidism 09/03/2012    RAMSEUR,CHRIS, PTA 11/02/2016, 4:21 PM  [facility] [address] [place], [place], [address] Phone: [phone]   Fax:  [phone]  Name: KESI PERROW MRN: [id number] Date of Birth: Jun 18, 1955

## 2016-11-07 ENCOUNTER — Ambulatory Visit: Payer: BLUE CROSS/BLUE SHIELD | Admitting: Physical Therapy

## 2016-11-07 ENCOUNTER — Encounter: Payer: Self-pay | Admitting: Physical Therapy

## 2016-11-07 DIAGNOSIS — M25561 Pain in right knee: Secondary | ICD-10-CM | POA: Diagnosis not present

## 2016-11-07 DIAGNOSIS — M6281 Muscle weakness (generalized): Secondary | ICD-10-CM

## 2016-11-07 DIAGNOSIS — G8929 Other chronic pain: Secondary | ICD-10-CM

## 2016-11-07 NOTE — Therapy (Signed)
[facility] [place], [place], [address] Phone: [phone]   Fax:  [phone]  Physical Therapy Treatment  Patient Details  Name: Heather [name] MRN: [id number] Date of Birth: 1955-09-19 Referring Provider: Nicholes Stairs MD  Encounter Date: 11/07/2016      PT End of Session - 11/07/16 1309    Visit Number 3   Number of Visits 12   Date for PT Re-Evaluation 11/28/16   PT Start Time 1301   PT Stop Time 1357   PT Time Calculation (min) 56 min   Activity Tolerance Patient tolerated treatment well   Behavior During Therapy [facility] for tasks assessed/performed      Past Medical History:  Diagnosis Date  . Anxiety   . Cataract   . Depression   . Fibromyalgia   . GERD (gastroesophageal reflux disease)   . Hyperlipidemia    no meds currently  . Hypothyroidism   . PONV (postoperative nausea and vomiting)   . Thyroid disease    hypo    Past Surgical History:  Procedure Laterality Date  . CATARACT EXTRACTION Left   . CATARACT EXTRACTION W/PHACO Right 12/29/2013   Procedure: CATARACT EXTRACTION PHACO AND INTRAOCULAR LENS PLACEMENT (IOC);  Surgeon: Tonny Branch, MD;  Location: [facility];  Service: Ophthalmology;  Laterality: Right;  CDE:4.84  . EYE SURGERY     cleaned debris from behind eye.  . TUBAL LIGATION    . tubal ligation reversal    . VAGINAL HYSTERECTOMY      There were no vitals filed for this visit.      Subjective Assessment - 11/07/16 1309    Subjective Reports having a rough day yesterday but feels a little better today. Pain now in a new spot and has increased pain with ambulation.   Limitations Walking   How long can you walk comfortably? Short community distances.   Patient Stated Goals Get out of pain.   Currently in Pain? Yes   Pain Score 4    Pain Location Knee   Pain Orientation Right   Pain Descriptors / Indicators Aching   Pain Type Surgical pain   Pain Onset 1 to 4 weeks ago             [facility] PT Assessment - 11/07/16 0001      Assessment   Medical Diagnosis Right arthroscopic medial meniscectomy.   Onset Date/Surgical Date 10/23/16   Next MD Visit 11/27/2016     Restrictions   Weight Bearing Restrictions No                     [facility] Adult PT Treatment/Exercise - 11/07/16 0001      Knee/Hip Exercises: Stretches   Passive Hamstring Stretch Right;3 reps;30 seconds   Other Knee/Hip Stretches R ITB stretch 3x30 sec     Knee/Hip Exercises: Aerobic   Stationary Bike x15 min     Knee/Hip Exercises: Supine   Quad Sets Right;2 sets;10 reps   Quad Sets Limitations 5 sec hold   Short Arc Quad Sets AROM;Right;5 reps   Heel Slides AROM;Right;2 sets;10 reps   Straight Leg Raises AROM;Right;2 sets;10 reps     Knee/Hip Exercises: Sidelying   Hip ABduction AROM;Right;2 sets;10 reps     Modalities   Modalities Insurance risk surveyor Parameters 1-10 hz x15 min   Electrical Stimulation  Goals Pain;Edema     Vasopneumatic   Number Minutes Vasopneumatic  15 minutes   Vasopnuematic Location  Knee   Vasopneumatic Pressure Medium   Vasopneumatic Temperature  34     Manual Therapy   Manual Therapy Soft tissue mobilization   Soft tissue mobilization R patella mobilizations in sup/inf, lat/med directions                            Plan - 11/07/16 1355    Clinical Impression Statement Patient arrived today with reports of pain with knee extension and limited with SAQ due to pain. No resistance added to stationary bike at this time. All other exercises completed today were completed without pain reports by patient. Slightly limited R patella mobility in all directions. Tightness palpated in R ITB thus stretches completed. Patient did report inferior R knee pain with HS stretch initially.  Normal modalities response noted following removal of the modalities.   Rehab Potential Excellent   PT Frequency 3x / week   PT Duration 4 weeks   PT Treatment/Interventions ADLs/Self Care Home Management;Electrical Stimulation;Cryotherapy;Gait training;Stair training;Functional mobility training;Therapeutic activities;Therapeutic exercise;Neuromuscular re-education;Patient/family education;Manual techniques;Vasopneumatic Device;Passive range of motion   PT Next Visit Plan Begin with Nustep then to stationary bike.  Progress to PAIN-FREE right quad strenthening.  E'stim/Vasopneumatic.   Consulted and Agree with Plan of Care Patient      Patient will benefit from skilled therapeutic intervention in order to improve the following deficits and impairments:  Abnormal gait, Decreased mobility, Decreased range of motion, Decreased strength, Increased edema, Pain  Visit Diagnosis: Chronic pain of right knee  Muscle weakness (generalized)     Problem List Patient Active Problem List   Diagnosis Date Noted  . GAD (generalized anxiety disorder) 01/04/2016  . Recurrent oral herpes simplex infection 01/04/2016  . Current smoker 01/04/2016  . Obesity (BMI 30-39.9) 10/30/2013  . GERD (gastroesophageal reflux disease) 09/27/2012  . Hyperlipidemia with target LDL less than 100 09/27/2012  . Depression 09/03/2012  . Hypothyroidism 09/03/2012    Wynelle Fanny, PTA 11/07/2016, 2:01 PM  [facility] [address] [place], [place], [address] Phone: [phone]   Fax:  [phone]  Name: Heather [name] MRN: [id number] Date of Birth: [birth year]

## 2016-11-09 ENCOUNTER — Encounter: Payer: Self-pay | Admitting: Physical Therapy

## 2016-11-09 ENCOUNTER — Ambulatory Visit: Payer: BLUE CROSS/BLUE SHIELD | Admitting: Physical Therapy

## 2016-11-09 DIAGNOSIS — M6281 Muscle weakness (generalized): Secondary | ICD-10-CM

## 2016-11-09 DIAGNOSIS — G8929 Other chronic pain: Secondary | ICD-10-CM

## 2016-11-09 DIAGNOSIS — M25561 Pain in right knee: Principal | ICD-10-CM

## 2016-11-09 NOTE — Therapy (Signed)
[facility] [place], [place], [address] Phone: [phone]   Fax:  [phone]  Physical Therapy Treatment  Patient Details  Name: Heather [name] MRN: [id number] Date of Birth: November 09, 1955 Referring Provider: Nicholes Stairs MD  Encounter Date: 11/09/2016      PT End of Session - 11/09/16 1330    Visit Number 4   Number of Visits 12   Date for PT Re-Evaluation 11/28/16   PT Start Time 1301   PT Stop Time 1344   PT Time Calculation (min) 43 min   Activity Tolerance Patient tolerated treatment well   Behavior During Therapy [facility] for tasks assessed/performed      Past Medical History:  Diagnosis Date  . Anxiety   . Cataract   . Depression   . Fibromyalgia   . GERD (gastroesophageal reflux disease)   . Hyperlipidemia    no meds currently  . Hypothyroidism   . PONV (postoperative nausea and vomiting)   . Thyroid disease    hypo    Past Surgical History:  Procedure Laterality Date  . CATARACT EXTRACTION Left   . CATARACT EXTRACTION W/PHACO Right 12/29/2013   Procedure: CATARACT EXTRACTION PHACO AND INTRAOCULAR LENS PLACEMENT (IOC);  Surgeon: Tonny Branch, MD;  Location: [facility];  Service: Ophthalmology;  Laterality: Right;  CDE:4.84  . EYE SURGERY     cleaned debris from behind eye.  . TUBAL LIGATION    . tubal ligation reversal    . VAGINAL HYSTERECTOMY      There were no vitals filed for this visit.      Subjective Assessment - 11/09/16 1308    Subjective Patient arrived with increased pain   Limitations Walking   How long can you walk comfortably? Short community distances.   Patient Stated Goals Get out of pain.   Currently in Pain? Yes   Pain Score 6    Pain Location Knee   Pain Orientation Right   Pain Descriptors / Indicators Aching   Pain Type Surgical pain   Pain Onset 1 to 4 weeks ago   Pain Frequency Constant   Aggravating Factors  walking   Pain Relieving Factors rest                          [facility] Adult PT Treatment/Exercise - 11/09/16 0001      Knee/Hip Exercises: Supine   Short Arc Quad Sets Strengthening;Right;3 sets;10 reps   Straight Leg Raises AROM;Right;2 sets;10 reps     Knee/Hip Exercises: Sidelying   Hip ABduction AROM;Right;2 sets;10 reps     Health visitor IFC   Electrical Stimulation Parameters 1-10hz  x40min   Electrical Stimulation Goals Pain;Edema     Vasopneumatic   Number Minutes Vasopneumatic  15 minutes   Vasopnuematic Location  Knee   Vasopneumatic Pressure Medium     Manual Therapy   Manual Therapy Soft tissue mobilization   Soft tissue mobilization R patella mobilizations in sup/inf, lat/med directions   Passive ROM gentle STW to knee to reduce pain                     PT Long Term Goals - 11/09/16 1331      PT LONG TERM GOAL #1   Title Independent with a HEP.   Time 6   Period Weeks   Status On-going     PT  LONG TERM GOAL #2   Title Full active right knee extension in order to normalize gait.   Time 6   Period Weeks   Status On-going     PT LONG TERM GOAL #3   Title Increase right knee strength to a solid 4+/5 to provide good stability for accomplishment of functional activities.   Time 6   Period Weeks   Status On-going     PT LONG TERM GOAL #4   Title Active right knee flexion to 125 degrees+ so the patient can perform functional tasks and do so with pain not > 2-3/10.   Time 6   Period Weeks   Status On-going     PT LONG TERM GOAL #5   Title perform a reciprocating stair gait with one railing with pain not > 3/10.   Time 6   Period Weeks   Status On-going               Plan - 11/09/16 1332    Clinical Impression Statement Patient tolerated treatment well today. Patient arrived with increased pain for unknown reason. Patient reported more pain with any prolong standing walking  or pressure on right knee. Today focused on pain free gentle supine exercises and STW to decrease pain. Patient current goals ongoing due to pain deficts.    Rehab Potential Excellent   PT Frequency 3x / week   PT Duration 4 weeks   PT Treatment/Interventions ADLs/Self Care Home Management;Electrical Stimulation;Cryotherapy;Gait training;Stair training;Functional mobility training;Therapeutic activities;Therapeutic exercise;Neuromuscular re-education;Patient/family education;Manual techniques;Vasopneumatic Device;Passive range of motion   PT Next Visit Plan cont with Nustep if pain level is less and progress with PAIN-FREE right quad strenthening.  E'stim/Vasopneumatic.   Consulted and Agree with Plan of Care Patient      Patient will benefit from skilled therapeutic intervention in order to improve the following deficits and impairments:  Abnormal gait, Decreased mobility, Decreased range of motion, Decreased strength, Increased edema, Pain  Visit Diagnosis: Chronic pain of right knee  Muscle weakness (generalized)     Problem List Patient Active Problem List   Diagnosis Date Noted  . GAD (generalized anxiety disorder) 01/04/2016  . Recurrent oral herpes simplex infection 01/04/2016  . Current smoker 01/04/2016  . Obesity (BMI 30-39.9) 10/30/2013  . GERD (gastroesophageal reflux disease) 09/27/2012  . Hyperlipidemia with target LDL less than 100 09/27/2012  . Depression 09/03/2012  . Hypothyroidism 09/03/2012    Bernabe Dorce P, PTA 11/09/2016, 1:45 PM  [facility] [address] [place], [place], [address] Phone: [phone]   Fax:  [phone]  Name: Heather [name] MRN: [id number] Date of Birth: 02-07-1955

## 2016-11-14 ENCOUNTER — Ambulatory Visit: Payer: BLUE CROSS/BLUE SHIELD | Admitting: *Deleted

## 2016-11-14 DIAGNOSIS — G8929 Other chronic pain: Secondary | ICD-10-CM

## 2016-11-14 DIAGNOSIS — M25561 Pain in right knee: Secondary | ICD-10-CM | POA: Diagnosis not present

## 2016-11-14 DIAGNOSIS — M6281 Muscle weakness (generalized): Secondary | ICD-10-CM

## 2016-11-14 NOTE — Therapy (Signed)
[facility] [place], [place], [address] Phone: [phone]   Fax:  [phone]  Physical Therapy Treatment  Patient Details  Name: Heather [name] MRN: [id number] Date of Birth: Dec 08, 1955 Referring Provider: Nicholes Stairs MD  Encounter Date: 11/14/2016      PT End of Session - 11/14/16 1203    Visit Number 5   Number of Visits 12   Date for PT Re-Evaluation 11/28/16   PT Start Time 1200   PT Stop Time 1252   PT Time Calculation (min) 52 min      Past Medical History:  Diagnosis Date  . Anxiety   . Cataract   . Depression   . Fibromyalgia   . GERD (gastroesophageal reflux disease)   . Hyperlipidemia    no meds currently  . Hypothyroidism   . PONV (postoperative nausea and vomiting)   . Thyroid disease    hypo    Past Surgical History:  Procedure Laterality Date  . CATARACT EXTRACTION Left   . CATARACT EXTRACTION W/PHACO Right 12/29/2013   Procedure: CATARACT EXTRACTION PHACO AND INTRAOCULAR LENS PLACEMENT (IOC);  Surgeon: Tonny Branch, MD;  Location: [facility];  Service: Ophthalmology;  Laterality: Right;  CDE:4.84  . EYE SURGERY     cleaned debris from behind eye.  . TUBAL LIGATION    . tubal ligation reversal    . VAGINAL HYSTERECTOMY      There were no vitals filed for this visit.      Subjective Assessment - 11/14/16 1200    Subjective Patient arrived with increased pain. 4/10   Limitations Walking   How long can you walk comfortably? Short community distances.   Patient Stated Goals Get out of pain.   Currently in Pain? Yes   Pain Location Knee   Pain Orientation Right   Pain Descriptors / Indicators Aching   Pain Type Surgical pain   Pain Onset 1 to 4 weeks ago   Pain Frequency Constant                         [facility] Adult PT Treatment/Exercise - 11/14/16 0001      Knee/Hip Exercises: Aerobic   Nustep L5  x 15 mins     Knee/Hip Exercises: Supine   Short Arc Quad  Sets Strengthening;Right;3 sets;10 reps   Straight Leg Raises AROM;Right;2 sets;10 reps     Knee/Hip Exercises: Sidelying   Hip ABduction AROM;Right;2 sets;10 reps     Modalities   Modalities Passenger transport manager Location R knee IFC 1-10hz  x 15 mins    Electrical Stimulation Goals Pain;Edema     Vasopneumatic   Number Minutes Vasopneumatic  15 minutes   Vasopnuematic Location  Knee   Vasopneumatic Pressure Medium   Vasopneumatic Temperature  34     Manual Therapy   Manual Therapy Soft tissue mobilization   Soft tissue mobilization R patella mobilizations in sup/inf, lat/med directions   Passive ROM gentle STW to knee to reduce pain                     PT Long Term Goals - 11/09/16 1331      PT LONG TERM GOAL #1   Title Independent with a HEP.   Time 6   Period Weeks   Status On-going     PT LONG TERM GOAL #2   Title Full active right  knee extension in order to normalize gait.   Time 6   Period Weeks   Status On-going     PT LONG TERM GOAL #3   Title Increase right knee strength to a solid 4+/5 to provide good stability for accomplishment of functional activities.   Time 6   Period Weeks   Status On-going     PT LONG TERM GOAL #4   Title Active right knee flexion to 125 degrees+ so the patient can perform functional tasks and do so with pain not > 2-3/10.   Time 6   Period Weeks   Status On-going     PT LONG TERM GOAL #5   Title perform a reciprocating stair gait with one railing with pain not > 3/10.   Time 6   Period Weeks   Status On-going               Plan - 11/14/16 1220    Clinical Impression Statement Pt arrived  today doing fairly well with moderate pain medial Jt line. She was able to perform all therex today with minimal pain increase. Today focused on ROM and Caslin strengthening. No LTGs met due to pain and ROM deficits.   Clinical Presentation Stable    Rehab Potential Excellent   PT Frequency 3x / week   PT Duration 4 weeks   PT Treatment/Interventions ADLs/Self Care Home Management;Electrical Stimulation;Cryotherapy;Gait training;Stair training;Functional mobility training;Therapeutic activities;Therapeutic exercise;Neuromuscular re-education;Patient/family education;Manual techniques;Vasopneumatic Device;Passive range of motion   PT Next Visit Plan cont with Nustep if pain level is less and progress with PAIN-FREE right quad strenthening.  E'stim/Vasopneumatic.   Consulted and Agree with Plan of Care Patient      Patient will benefit from skilled therapeutic intervention in order to improve the following deficits and impairments:  Abnormal gait, Decreased mobility, Decreased range of motion, Decreased strength, Increased edema, Pain  Visit Diagnosis: Chronic pain of right knee  Muscle weakness (generalized)     Problem List Patient Active Problem List   Diagnosis Date Noted  . GAD (generalized anxiety disorder) 01/04/2016  . Recurrent oral herpes simplex infection 01/04/2016  . Current smoker 01/04/2016  . Obesity (BMI 30-39.9) 10/30/2013  . GERD (gastroesophageal reflux disease) 09/27/2012  . Hyperlipidemia with target LDL less than 100 09/27/2012  . Depression 09/03/2012  . Hypothyroidism 09/03/2012    Yovan Leeman,CHRIS, PTA 11/14/2016, 12:54 PM  [facility] [address] [place], [place], [address] Phone: [phone]   Fax:  [phone]  Name: Heather [name] MRN: [id number] Date of Birth: 09-Dec-1955

## 2016-11-20 ENCOUNTER — Encounter: Payer: BLUE CROSS/BLUE SHIELD | Admitting: Physical Therapy

## 2016-11-22 ENCOUNTER — Encounter: Payer: Self-pay | Admitting: Physical Therapy

## 2016-11-22 ENCOUNTER — Ambulatory Visit: Payer: BLUE CROSS/BLUE SHIELD | Attending: Orthopedic Surgery | Admitting: Physical Therapy

## 2016-11-22 DIAGNOSIS — M6281 Muscle weakness (generalized): Secondary | ICD-10-CM | POA: Diagnosis present

## 2016-11-22 DIAGNOSIS — G8929 Other chronic pain: Secondary | ICD-10-CM

## 2016-11-22 DIAGNOSIS — M25561 Pain in right knee: Secondary | ICD-10-CM | POA: Insufficient documentation

## 2016-11-22 NOTE — Therapy (Signed)
[facility] [place], [place], [address] Phone: [phone]   Fax:  [phone]  Physical Therapy Treatment  Patient Details  Name: Heather [name] MRN: [id number] Date of Birth: [birth year] Referring Provider: Nicholes Stairs MD   Encounter Date: 11/22/2016  PT End of Session - 11/22/16 1410    Visit Number  6    Number of Visits  12    Date for PT Re-Evaluation  11/28/16    PT Start Time  2585    PT Stop Time  1414    PT Time Calculation (min)  53 min    Activity Tolerance  Patient tolerated treatment well    Behavior During Therapy  [facility] for tasks assessed/performed       Past Medical History:  Diagnosis Date  . Anxiety   . Cataract   . Depression   . Fibromyalgia   . GERD (gastroesophageal reflux disease)   . Hyperlipidemia    no meds currently  . Hypothyroidism   . PONV (postoperative nausea and vomiting)   . Thyroid disease    hypo    Past Surgical History:  Procedure Laterality Date  . CATARACT EXTRACTION Left   . EYE SURGERY     cleaned debris from behind eye.  . TUBAL LIGATION    . tubal ligation reversal    . VAGINAL HYSTERECTOMY      There were no vitals filed for this visit.  Subjective Assessment - 11/22/16 1327    Subjective  Patient reported ongoing discomfort with any prolong walking /activity    Limitations  Walking    How long can you walk comfortably?  Short community distances.    Patient Stated Goals  Get out of pain.    Currently in Pain?  Yes    Pain Score  5     Pain Location  Knee    Pain Orientation  Right    Pain Descriptors / Indicators  Discomfort;Sore;Aching    Pain Type  Surgical pain    Pain Onset  1 to 4 weeks ago    Pain Frequency  Constant    Aggravating Factors   prolong walking/activity    Pain Relieving Factors  rest         [facility] PT Assessment - 11/22/16 0001      ROM / Strength   AROM / PROM / Strength  AROM;PROM      AROM   AROM Assessment Site   Knee    Right/Left Knee  Right    Right Knee Extension  0    Right Knee Flexion  120      PROM   PROM Assessment Site  Knee    Right/Left Knee  Right    Left Knee Extension  0    Left Knee Flexion  125                  [facility] Adult PT Treatment/Exercise - 11/22/16 0001      Knee/Hip Exercises: Aerobic   Nustep  L5  x 15 mins      Electrical Stimulation   Electrical Stimulation Location  R knee IFC 1-_0  x 15 mins     Electrical Stimulation Goals  Pain;Edema      Vasopneumatic   Number Minutes Vasopneumatic   15 minutes    Vasopnuematic Location   Knee    Vasopneumatic Pressure  Medium      Manual Therapy   Manual Therapy  Soft tissue  mobilization;Passive ROM    Soft tissue mobilization  R patella mobilizations in sup/inf, lat/med directions    Passive ROM  gentle PROM for knee ext/flexion STW to knee to reduce pain                  PT Long Term Goals - 11/22/16 1359      PT LONG TERM GOAL #1   Title  Independent with a HEP.    Time  6    Period  Weeks    Status  On-going      PT LONG TERM GOAL #2   Title  Full active right knee extension in order to normalize gait.    Time  6    Period  Weeks    Status  Achieved AROM 0 degrees 11/22/16   AROM 0 degrees 11/22/16     PT LONG TERM GOAL #3   Title  Increase right knee strength to a solid 4+/5 to provide good stability for accomplishment of functional activities.    Time  6    Period  Weeks    Status  On-going      PT LONG TERM GOAL #4   Title  Active right knee flexion to 125 degrees+ so the patient can perform functional tasks and do so with pain not > 2-3/10.    Time  6    Status  On-going AROM 120 degrees 11/22/16   AROM 120 degrees 11/22/16     PT LONG TERM GOAL #5   Title  perform a reciprocating stair gait with one railing with pain not > 3/10.    Time  6    Period  Weeks    Status  On-going            Plan - 11/22/16 1400    Clinical Impression Statement  Patient  tolerated treatment well today. Patient has improved with ROM today. Patient has ongoing discomfort with any prolong standing or walking. Patient met LTG #2 others ongoing. MD next week.    Rehab Potential  Excellent    PT Frequency  3x / week    PT Duration  4 weeks    PT Treatment/Interventions  ADLs/Self Care Home Management;Electrical Stimulation;Cryotherapy;Gait training;Stair training;Functional mobility training;Therapeutic activities;Therapeutic exercise;Neuromuscular re-education;Patient/family education;Manual techniques;Vasopneumatic Device;Passive range of motion    PT Next Visit Plan  cont with Nustep if pain level is less and progress with PAIN-FREE right quad strenthening/ consider standing TKA with t-band.  E'stim/Vasopneumatic. (MD note next visit)    Consulted and Agree with Plan of Care  Patient       Patient will benefit from skilled therapeutic intervention in order to improve the following deficits and impairments:  Abnormal gait, Decreased mobility, Decreased range of motion, Decreased strength, Increased edema, Pain  Visit Diagnosis: Chronic pain of right knee  Muscle weakness (generalized)     Problem List Patient Active Problem List   Diagnosis Date Noted  . GAD (generalized anxiety disorder) 01/04/2016  . Recurrent oral herpes simplex infection 01/04/2016  . Current smoker 01/04/2016  . Obesity (BMI 30-39.9) 10/30/2013  . GERD (gastroesophageal reflux disease) 09/27/2012  . Hyperlipidemia with target LDL less than 100 09/27/2012  . Depression 09/03/2012  . Hypothyroidism 09/03/2012    Frida Wahlstrom P, PTA 11/22/2016, 2:21 PM  [facility] [address] [place], [place], [address] Phone: [phone]   Fax:  [phone]  Name: Heather [name] MRN: [id number] Date of Birth: 01-28-1955

## 2016-11-23 ENCOUNTER — Encounter: Payer: Self-pay | Admitting: Physical Therapy

## 2016-11-23 ENCOUNTER — Ambulatory Visit: Payer: BLUE CROSS/BLUE SHIELD | Admitting: Physical Therapy

## 2016-11-23 DIAGNOSIS — G8929 Other chronic pain: Secondary | ICD-10-CM

## 2016-11-23 DIAGNOSIS — M25561 Pain in right knee: Principal | ICD-10-CM

## 2016-11-23 DIAGNOSIS — M6281 Muscle weakness (generalized): Secondary | ICD-10-CM

## 2016-11-23 NOTE — Therapy (Signed)
[facility] [place], [place], [address] Phone: [phone]   Fax:  [phone]  Physical Therapy Treatment  Patient Details  Name: Heather [name] MRN: [id number] Date of Birth: 11-Apr-1955 Referring Provider: Nicholes Stairs MD   Encounter Date: 11/23/2016  PT End of Session - 11/23/16 1152    Visit Number  7    Number of Visits  12    Date for PT Re-Evaluation  11/28/16    PT Start Time  1115    PT Stop Time  1200    PT Time Calculation (min)  45 min    Activity Tolerance  Patient tolerated treatment well    Behavior During Therapy  [facility] for tasks assessed/performed       Past Medical History:  Diagnosis Date  . Anxiety   . Cataract   . Depression   . Fibromyalgia   . GERD (gastroesophageal reflux disease)   . Hyperlipidemia    no meds currently  . Hypothyroidism   . PONV (postoperative nausea and vomiting)   . Thyroid disease    hypo    Past Surgical History:  Procedure Laterality Date  . CATARACT EXTRACTION Left   . EYE SURGERY     cleaned debris from behind eye.  . TUBAL LIGATION    . tubal ligation reversal    . VAGINAL HYSTERECTOMY      There were no vitals filed for this visit.  Subjective Assessment - 11/23/16 1150    Subjective  Pt reporting doing better overall, but still reporting discomfort with prolonged walking and activities.  Pt also reporting increased pain with bending.     Limitations  Walking;House hold activities    How long can you walk comfortably?  Short community distances.    Patient Stated Goals  Get out of pain. Walk for more than 30 minutes without pain    Currently in Pain?  Yes    Pain Score  4     Pain Location  Knee    Pain Orientation  Right    Pain Descriptors / Indicators  Aching;Discomfort;Tightness;Sore    Pain Type  Surgical pain    Pain Onset  1 to 4 weeks ago    Pain Frequency  Intermittent    Aggravating Factors   prolonged walking/activities    Pain  Relieving Factors  resting         [facility] PT Assessment - 11/23/16 0001      Assessment   Medical Diagnosis  R arthroscopic medial meniscectomy    Onset Date/Surgical Date  10/23/16    Next MD Visit  11/27/16      Restrictions   Weight Bearing Restrictions  No      ROM / Strength   AROM / PROM / Strength  AROM;PROM      AROM   AROM Assessment Site  Knee    Right/Left Knee  Right    Right Knee Extension  0    Right Knee Flexion  122      PROM   PROM Assessment Site  Knee    Right/Left Knee  Right    Left Knee Extension  0    Left Knee Flexion  125      Strength   Overall Strength Comments  R hip strength 4/5                  [facility] Adult PT Treatment/Exercise - 11/23/16 0001      Ambulation/Gait  Gait Comments  Pt still amb with intermittent antalgic gait pattern with decreased stance phase on the R LE.       Knee/Hip Exercises: Aerobic   Nustep  L5  x 12 mins      Knee/Hip Exercises: Supine   Short Arc Quad Sets  Strengthening;2 sets;10 reps    Straight Leg Raises  Strengthening;2 sets;10 reps      Knee/Hip Exercises: Sidelying   Hip ABduction  Strengthening;2 sets;10 reps      Modalities   Modalities  Electrical engineer Stimulation Location  R knee IFC 1-10hz  x 15 mins     Electrical Stimulation Goals  Edema;Pain      Vasopneumatic   Number Minutes Vasopneumatic   15 minutes    Vasopnuematic Location   Knee    Vasopneumatic Pressure  Medium    Vasopneumatic Temperature   34      Manual Therapy   Manual Therapy  Soft tissue mobilization;Passive ROM    Soft tissue mobilization  R patella mobilizations in sup/inf, lat/med directions    Passive ROM  gentle PROM for knee ext/flexion STW to knee to reduce pain             PT Education - 11/23/16 1152    Education provided  Yes    Education Details  HEP    Person(s) Educated  Patient    Methods  Explanation    Comprehension  Verbalized  understanding          PT Long Term Goals - 11/23/16 1155      PT LONG TERM GOAL #1   Title  Independent with a HEP.    Time  6    Period  Weeks    Status  Achieved      PT LONG TERM GOAL #2   Title  Full active right knee extension in order to normalize gait.    Baseline  intermittent episodes of decreased stance on R LE due to pain.    Time  6    Period  Weeks    Status  Achieved      PT LONG TERM GOAL #3   Title  Increase right knee strength to a solid 4+/5 to provide good stability for accomplishment of functional activities.    Time  6    Period  Weeks    Status  On-going      PT LONG TERM GOAL #4   Title  Active right knee flexion to 125 degrees+ so the patient can perform functional tasks and do so with pain not > 2-3/10.    Status  On-going      PT LONG TERM GOAL #5   Title  perform a reciprocating stair gait with one railing with pain not > 3/10.    Status  On-going            Plan - 11/23/16 1153    Clinical Impression Statement  Patient tolerating treatment well. Pt has improved in overall ROM from 0-122 degrees actively. Pt still reporting discomfort and stiffness with prolonged walking and bending. Pt has met LTG #1 and #2 still progressing toward other goals set. MD visit scheduled for Monday. Recommending pt continue skilled PT to work towrad PLOF and goals set.     Rehab Potential  Excellent    PT Frequency  2x / week    PT Duration  4 weeks    PT Treatment/Interventions  ADLs/Self Care Home Management;Electrical Stimulation;Cryotherapy;Gait training;Stair training;Functional mobility training;Therapeutic activities;Therapeutic exercise;Neuromuscular re-education;Patient/family education;Manual techniques;Vasopneumatic Device;Passive range of motion    PT Next Visit Plan  cont with Nustep if pain level is less and progress with PAIN-FREE right quad strenthening/ consider standing TKA with t-band.  E'stim/Vasopneumatic. (MD note next visit)     Consulted and Agree with Plan of Care  Patient       Patient will benefit from skilled therapeutic intervention in order to improve the following deficits and impairments:     Visit Diagnosis: Chronic pain of right knee  Muscle weakness (generalized)     Problem List Patient Active Problem List   Diagnosis Date Noted  . GAD (generalized anxiety disorder) 01/04/2016  . Recurrent oral herpes simplex infection 01/04/2016  . Current smoker 01/04/2016  . Obesity (BMI 30-39.9) 10/30/2013  . GERD (gastroesophageal reflux disease) 09/27/2012  . Hyperlipidemia with target LDL less than 100 09/27/2012  . Depression 09/03/2012  . Hypothyroidism 09/03/2012    Oretha Caprice, MPT 11/23/2016, 11:59 AM  [facility] [address] [place], [place], [address] Phone: [phone]   Fax:  [phone]  Name: Heather [name] MRN: [id number] Date of Birth: 12-07-1955

## 2016-11-28 ENCOUNTER — Encounter: Payer: BLUE CROSS/BLUE SHIELD | Admitting: Physical Therapy

## 2016-11-30 ENCOUNTER — Encounter: Payer: BLUE CROSS/BLUE SHIELD | Admitting: Physical Therapy

## 2017-01-25 ENCOUNTER — Ambulatory Visit: Payer: BLUE CROSS/BLUE SHIELD | Admitting: Nurse Practitioner

## 2017-01-25 ENCOUNTER — Other Ambulatory Visit: Payer: Self-pay | Admitting: Nurse Practitioner

## 2017-01-25 ENCOUNTER — Encounter: Payer: Self-pay | Admitting: Nurse Practitioner

## 2017-01-25 VITALS — BP 128/72 | HR 97 | Temp 97.9°F | Ht 64.0 in | Wt 178.0 lb

## 2017-01-25 DIAGNOSIS — K219 Gastro-esophageal reflux disease without esophagitis: Secondary | ICD-10-CM | POA: Diagnosis not present

## 2017-01-25 DIAGNOSIS — F411 Generalized anxiety disorder: Secondary | ICD-10-CM

## 2017-01-25 DIAGNOSIS — F331 Major depressive disorder, recurrent, moderate: Secondary | ICD-10-CM | POA: Diagnosis not present

## 2017-01-25 DIAGNOSIS — E669 Obesity, unspecified: Secondary | ICD-10-CM | POA: Diagnosis not present

## 2017-01-25 DIAGNOSIS — E034 Atrophy of thyroid (acquired): Secondary | ICD-10-CM

## 2017-01-25 DIAGNOSIS — F17200 Nicotine dependence, unspecified, uncomplicated: Secondary | ICD-10-CM

## 2017-01-25 DIAGNOSIS — E785 Hyperlipidemia, unspecified: Secondary | ICD-10-CM

## 2017-01-25 DIAGNOSIS — B0089 Other herpesviral infection: Secondary | ICD-10-CM

## 2017-01-25 MED ORDER — LEVOTHYROXINE SODIUM 150 MCG PO TABS: 150.0000 ug | ORAL_TABLET | Freq: Every day | ORAL | 1 refills | Status: DC

## 2017-01-25 MED ORDER — OMEPRAZOLE 40 MG PO CPDR: 40.0000 mg | DELAYED_RELEASE_CAPSULE | Freq: Every day | ORAL | 1 refills | Status: DC

## 2017-01-25 MED ORDER — BUSPIRONE HCL 15 MG PO TABS: 15.0000 mg | ORAL_TABLET | Freq: Three times a day (TID) | ORAL | 1 refills | Status: DC

## 2017-01-25 NOTE — Patient Instructions (Signed)
Steps to Quit Smoking Smoking tobacco can be bad for your health. It can also affect almost every organ in your body. Smoking puts you and people around you at risk for many serious long-lasting (chronic) diseases. Quitting smoking is hard, but it is one of the best things that you can do for your health. It is never too late to quit. What are the benefits of quitting smoking? When you quit smoking, you lower your risk for getting serious diseases and conditions. They can include:  Lung cancer or lung disease.  Heart disease.  Stroke.  Heart attack.  Not being able to have children (infertility).  Weak bones (osteoporosis) and broken bones (fractures).  If you have coughing, wheezing, and shortness of breath, those symptoms may get better when you quit. You may also get sick less often. If you are pregnant, quitting smoking can help to lower your chances of having a baby of low birth weight. What can I do to help me quit smoking? Talk with your doctor about what can help you quit smoking. Some things you can do (strategies) include:  Quitting smoking totally, instead of slowly cutting back how much you smoke over a period of time.  Going to in-person counseling. You are more likely to quit if you go to many counseling sessions.  Using resources and support systems, such as: ? Online chats with a counselor. ? Phone quitlines. ? Printed self-help materials. ? Support groups or group counseling. ? Text messaging programs. ? Mobile phone apps or applications.  Taking medicines. Some of these medicines may have nicotine in them. If you are pregnant or breastfeeding, do not take any medicines to quit smoking unless your doctor says it is okay. Talk with your doctor about counseling or other things that can help you.  Talk with your doctor about using more than one strategy at the same time, such as taking medicines while you are also going to in-person counseling. This can help make  quitting easier. What things can I do to make it easier to quit? Quitting smoking might feel very hard at first, but there is a lot that you can do to make it easier. Take these steps:  Talk to your family and friends. Ask them to support and encourage you.  Call phone quitlines, reach out to support groups, or work with a counselor.  Ask people who smoke to not smoke around you.  Avoid places that make you want (trigger) to smoke, such as: ? Bars. ? Parties. ? Smoke-break areas at work.  Spend time with people who do not smoke.  Lower the stress in your life. Stress can make you want to smoke. Try these things to help your stress: ? Getting regular exercise. ? Deep-breathing exercises. ? Yoga. ? Meditating. ? Doing a body scan. To do this, close your eyes, focus on one area of your body at a time from head to toe, and notice which parts of your body are tense. Try to relax the muscles in those areas.  Download or buy apps on your mobile phone or tablet that can help you stick to your quit plan. There are many free apps, such as QuitGuide from the CDC (Centers for Disease Control and Prevention). You can find more support from smokefree.gov and other websites.  This information is not intended to replace advice given to you by your health care provider. Make sure you discuss any questions you have with your health care provider. Document Released: 10/29/2008 Document   Revised: 08/31/2015 Document Reviewed: 05/19/2014 Elsevier Interactive Patient Education  2018 Elsevier Inc.  

## 2017-01-25 NOTE — Progress Notes (Signed)
Subjective:    Patient ID: Heather [name], female    DOB: 31-Oct-1955, 62 y.o.   MRN: [id number]  HPI  Heather [name] is here today for follow up of chronic medical problem.  Outpatient Encounter Medications as of 01/25/2017  Medication Sig  . aspirin EC 81 MG tablet Take 81 mg by mouth daily.  . busPIRone (BUSPAR) 15 MG tablet Take 1 tablet (15 mg total) by mouth 3 (three) times daily.  . fenofibrate 160 MG tablet Take 1 Tablet by mouth once daily  . FLUoxetine (PROZAC) 40 MG capsule Take 1 capsule (40 mg total) by mouth daily.  Marland Kitchen levothyroxine (SYNTHROID, LEVOTHROID) 150 MCG tablet Take 1 Tablet by mouth once daily BEFORE BREAKFAST  . omeprazole (PRILOSEC) 40 MG capsule Take 1 Capsule by mouth once daily  . valACYclovir (VALTREX) 1000 MG tablet Take 2 tablets 2 times a day for 1 day at fever blister onset     1. Gastroesophageal reflux disease without esophagitis  Doing well, has to take omeprazole daily to keep gastric reflux under control  2. Recurrent oral herpes simplex infection  No recent out breaks  3. Hypothyroidism due to acquired atrophy of thyroid  No problems that sheis aware of  4. Moderate episode of recurrent major depressive disorder ([place])  Is on prozac- says it works well Depression screen [facility] 2/9 01/25/2017 10/18/2016 09/06/2016  Decreased Interest 0 0 0  Down, Depressed, Hopeless 0 0 0  PHQ - 2 Score 0 0 0  Altered sleeping - - -  Tired, decreased energy - - -  Change in appetite - - -  Feeling bad or failure about yourself  - - -  Trouble concentrating - - -  Moving slowly or fidgety/restless - - -  Suicidal thoughts - - -  PHQ-9 Score - - -     5. Hyperlipidemia with target LDL less than 100  Trying to watch diet  6. Obesity (BMI 30-39.9)  No recent weight changes  7. GAD (generalized anxiety disorder)  Takes buspar daily which helps keep her calm  8. Current smoker  Cannot seem to quit    New complaints: No problems  Social history: Retired last  week from [employer]    Review of Systems  Constitutional: Negative for activity change and appetite change.  HENT: Negative.   Eyes: Negative for pain.  Respiratory: Negative for shortness of breath.   Cardiovascular: Negative for chest pain, palpitations and leg swelling.  Gastrointestinal: Negative for abdominal pain.  Endocrine: Negative for polydipsia.  Genitourinary: Negative.   Skin: Negative for rash.  Neurological: Negative for dizziness, weakness and headaches.  Hematological: Does not bruise/bleed easily.  Psychiatric/Behavioral: Negative.   All other systems reviewed and are negative.      Objective:   Physical Exam  Constitutional: She is oriented to person, place, and time. She appears well-developed and well-nourished.  HENT:  Nose: Nose normal.  Mouth/Throat: Oropharynx is clear and moist.  Eyes: EOM are normal.  Neck: Trachea normal, normal range of motion and full passive range of motion without pain. Neck supple. No JVD present. Carotid bruit is not present. No thyromegaly present.  Cardiovascular: Normal rate, regular rhythm, normal heart sounds and intact distal pulses. Exam reveals no gallop and no friction rub.  No murmur heard. Pulmonary/Chest: Effort normal and breath sounds normal.  Abdominal: Soft. Bowel sounds are normal. She exhibits no distension and no mass. There is no tenderness.  Musculoskeletal: Normal range of motion.  Lymphadenopathy:    She has no cervical adenopathy.  Neurological: She is alert and oriented to person, place, and time. She has normal reflexes.  Skin: Skin is warm and dry.  Psychiatric: She has a normal mood and affect. Her behavior is normal. Judgment and thought content normal.   BP 128/72   Pulse 97   Temp 97.9 F (36.6 C) (Oral)   Ht 5' 4" (1.626 m)   Wt 178 lb (80.7 kg)   BMI 30.55 kg/m        Assessment & Plan:  1. Gastroesophageal reflux disease without esophagitis Avoid spicy foods Do not eat 2  hours prior to bedtime - omeprazole (PRILOSEC) 40 MG capsule; Take 1 capsule (40 mg total) by mouth daily.  Dispense: 90 capsule; Refill: 1  2. Recurrent oral herpes simplex infection  3. Hypothyroidism due to acquired atrophy of thyroid - Thyroid Panel With TSH - levothyroxine (SYNTHROID, LEVOTHROID) 150 MCG tablet; Take 1 tablet (150 mcg total) by mouth daily before breakfast.  Dispense: 90 tablet; Refill: 1  4. Moderate episode of recurrent major depressive disorder ([place]) Stress management  5. Hyperlipidemia with target LDL less than 100 Low fat diet - CMP14+EGFR - Lipid panel  6. Obesity (BMI 30-39.9) Discussed diet and exercise for person with BMI >25 Will recheck weight in 3-6 months  7. GAD (generalized anxiety disorder) Stress management - busPIRone (BUSPAR) 15 MG tablet; Take 1 tablet (15 mg total) by mouth 3 (three) times daily.  Dispense: 90 tablet; Refill: 1  8. Current smoker Smoking cessation encouraged    Labs pending Health maintenance reviewed Diet and exercise encouraged Continue all meds Follow up  In 6 month   [place], FNP

## 2017-01-26 ENCOUNTER — Telehealth: Payer: Self-pay | Admitting: Nurse Practitioner

## 2017-01-26 ENCOUNTER — Other Ambulatory Visit: Payer: Self-pay | Admitting: Nurse Practitioner

## 2017-01-26 LAB — CMP14+EGFR
ALT: 11 IU/L (ref 0–32)
AST: 12 IU/L (ref 0–40)
Albumin/Globulin Ratio: 1.7 (ref 1.2–2.2)
Albumin: 4.5 g/dL (ref 3.6–4.8)
Alkaline Phosphatase: 98 IU/L (ref 39–117)
BUN/Creatinine Ratio: 19 (ref 12–28)
BUN: 11 mg/dL (ref 8–27)
Bilirubin Total: 0.3 mg/dL (ref 0.0–1.2)
CO2: 22 mmol/L (ref 20–29)
Calcium: 10 mg/dL (ref 8.7–10.3)
Chloride: 104 mmol/L (ref 96–106)
Creatinine, Ser: 0.58 mg/dL (ref 0.57–1.00)
GFR calc Af Amer: 114 mL/min/{1.73_m2} (ref >59)
GFR calc non Af Amer: 99 mL/min/{1.73_m2} (ref >59)
Globulin, Total: 2.6 g/dL (ref 1.5–4.5)
Glucose: 108 mg/dL — ABNORMAL HIGH (ref 65–99)
Potassium: 4.2 mmol/L (ref 3.5–5.2)
Sodium: 141 mmol/L (ref 134–144)
Total Protein: 7.1 g/dL (ref 6.0–8.5)

## 2017-01-26 LAB — LIPID PANEL
Chol/HDL Ratio: 6 ratio — ABNORMAL HIGH (ref 0.0–4.4)
Cholesterol, Total: 227 mg/dL — ABNORMAL HIGH (ref 100–199)
HDL: 38 mg/dL — ABNORMAL LOW (ref >39)
LDL Calculated: 141 mg/dL — ABNORMAL HIGH (ref 0–99)
Triglycerides: 240 mg/dL — ABNORMAL HIGH (ref 0–149)
VLDL Cholesterol Cal: 48 mg/dL — ABNORMAL HIGH (ref 5–40)

## 2017-01-26 LAB — THYROID PANEL WITH TSH
Free Thyroxine Index: 3.5 (ref 1.2–4.9)
T3 Uptake Ratio: 30 % (ref 24–39)
T4, Total: 11.6 ug/dL (ref 4.5–12.0)
TSH: 0.012 u[IU]/mL — ABNORMAL LOW (ref 0.450–4.500)

## 2017-01-26 MED ORDER — LEVOTHYROXINE SODIUM 125 MCG PO TABS: 125.0000 ug | ORAL_TABLET | Freq: Every day | ORAL | 11 refills | Status: DC

## 2017-01-26 NOTE — Telephone Encounter (Signed)
Pt notified of results Verbalizes understanding 

## 2017-01-26 NOTE — Telephone Encounter (Signed)
-----   Message from [facility] [place] sent at 01/26/2017  4:57 PM EST ----- Kidney and liver function stable ldl up as well as trig- needs  To be on statin- was good last check TSH is low - need to decrease levothyroxxin dose- rx sent to pharmacy- rechcek labs in 6 weeks

## 2017-02-21 DIAGNOSIS — S83249A Other tear of medial meniscus, current injury, unspecified knee, initial encounter: Secondary | ICD-10-CM | POA: Insufficient documentation

## 2017-03-06 ENCOUNTER — Other Ambulatory Visit: Payer: Self-pay

## 2017-03-06 DIAGNOSIS — F331 Major depressive disorder, recurrent, moderate: Secondary | ICD-10-CM

## 2017-03-06 MED ORDER — FLUOXETINE HCL 40 MG PO CAPS: 40.0000 mg | ORAL_CAPSULE | Freq: Every day | ORAL | 1 refills | Status: DC

## 2017-04-03 ENCOUNTER — Telehealth: Payer: Self-pay | Admitting: *Deleted

## 2017-04-03 MED ORDER — BUSPIRONE HCL 30 MG PO TABS: 15.0000 mg | ORAL_TABLET | Freq: Two times a day (BID) | ORAL | 0 refills | Status: DC

## 2017-04-03 NOTE — Telephone Encounter (Signed)
buspar 15mg  on back order- sent in rx for 30mg  to be broken in half BID

## 2017-04-03 NOTE — Telephone Encounter (Signed)
Let patient know about buspar change

## 2017-04-03 NOTE — Telephone Encounter (Signed)
Pt states she only takes 1 Buspar 15mg  daily and has never had to take 3 a day. Does she need the Buspar 30mg ?

## 2017-04-03 NOTE — Telephone Encounter (Signed)
Patient notified

## 2017-04-03 NOTE — Telephone Encounter (Signed)
Fax received [facility] requested Buspirone HCL 15 mg tablet Strength is on backorder, pharmacy has other strengths Please advise is Rx can be changed

## 2017-04-10 ENCOUNTER — Ambulatory Visit (INDEPENDENT_AMBULATORY_CARE_PROVIDER_SITE_OTHER): Payer: BLUE CROSS/BLUE SHIELD | Admitting: Nurse Practitioner

## 2017-04-10 ENCOUNTER — Encounter: Payer: Self-pay | Admitting: Nurse Practitioner

## 2017-04-10 VITALS — BP 121/77 | HR 99 | Temp 97.5°F | Ht 64.0 in | Wt 175.4 lb

## 2017-04-10 DIAGNOSIS — E034 Atrophy of thyroid (acquired): Secondary | ICD-10-CM

## 2017-04-10 DIAGNOSIS — E669 Obesity, unspecified: Secondary | ICD-10-CM

## 2017-04-10 DIAGNOSIS — E785 Hyperlipidemia, unspecified: Secondary | ICD-10-CM

## 2017-04-10 DIAGNOSIS — F331 Major depressive disorder, recurrent, moderate: Secondary | ICD-10-CM

## 2017-04-10 DIAGNOSIS — F17200 Nicotine dependence, unspecified, uncomplicated: Secondary | ICD-10-CM

## 2017-04-10 DIAGNOSIS — Z0000 Encounter for general adult medical examination without abnormal findings: Secondary | ICD-10-CM | POA: Diagnosis not present

## 2017-04-10 DIAGNOSIS — F411 Generalized anxiety disorder: Secondary | ICD-10-CM

## 2017-04-10 DIAGNOSIS — K219 Gastro-esophageal reflux disease without esophagitis: Secondary | ICD-10-CM

## 2017-04-10 MED ORDER — BUSPIRONE HCL 30 MG PO TABS: 15.0000 mg | ORAL_TABLET | Freq: Two times a day (BID) | ORAL | 3 refills | Status: DC

## 2017-04-10 NOTE — Patient Instructions (Signed)
Stress and Stress Management Stress is a normal reaction to life events. It is what you feel when life demands more than you are used to or more than you can handle. Some stress can be useful. For example, the stress reaction can help you catch the last bus of the day, study for a test, or meet a deadline at work. But stress that occurs too often or for too long can cause problems. It can affect your emotional health and interfere with relationships and normal daily activities. Too much stress can weaken your immune system and increase your risk for physical illness. If you already have a medical problem, stress can make it worse. What are the causes? All sorts of life events may cause stress. An event that causes stress for one person may not be stressful for another person. Major life events commonly cause stress. These may be positive or negative. Examples include losing your job, moving into a new home, getting married, having a baby, or losing a loved one. Less obvious life events may also cause stress, especially if they occur day after day or in combination. Examples include working long hours, driving in traffic, caring for children, being in debt, or being in a difficult relationship. What are the signs or symptoms? Stress may cause emotional symptoms including, the following:  Anxiety. This is feeling worried, afraid, on edge, overwhelmed, or out of control.  Anger. This is feeling irritated or impatient.  Depression. This is feeling sad, down, helpless, or guilty.  Difficulty focusing, remembering, or making decisions.  Stress may cause physical symptoms, including the following:  Aches and pains. These may affect your head, neck, back, stomach, or other areas of your body.  Tight muscles or clenched jaw.  Low energy or trouble sleeping.  Stress may cause unhealthy behaviors, including the following:  Eating to feel better (overeating) or skipping meals.  Sleeping too little,  too much, or both.  Working too much or putting off tasks (procrastination).  Smoking, drinking alcohol, or using drugs to feel better.  How is this diagnosed? Stress is diagnosed through an assessment by your health care provider. Your health care provider will ask questions about your symptoms and any stressful life events.Your health care provider will also ask about your medical history and may order blood tests or other tests. Certain medical conditions and medicine can cause physical symptoms similar to stress. Mental illness can cause emotional symptoms and unhealthy behaviors similar to stress. Your health care provider may refer you to a mental health professional for further evaluation. How is this treated? Stress management is the recommended treatment for stress.The goals of stress management are reducing stressful life events and coping with stress in healthy ways. Techniques for reducing stressful life events include the following:  Stress identification. Self-monitor for stress and identify what causes stress for you. These skills may help you to avoid some stressful events.  Time management. Set your priorities, keep a calendar of events, and learn to say "no." These tools can help you avoid making too many commitments.  Techniques for coping with stress include the following:  Rethinking the problem. Try to think realistically about stressful events rather than ignoring them or overreacting. Try to find the positives in a stressful situation rather than focusing on the negatives.  Exercise. Physical exercise can release both physical and emotional tension. The key is to find a form of exercise you enjoy and do it regularly.  Relaxation techniques. These relax the body and  mind. Examples include yoga, meditation, tai chi, biofeedback, deep breathing, progressive muscle relaxation, listening to music, being out in nature, journaling, and other hobbies. Again, the key is to find  one or more that you enjoy and can do regularly.  Healthy lifestyle. Eat a balanced diet, get plenty of sleep, and do not smoke. Avoid using alcohol or drugs to relax.  Strong support network. Spend time with family, friends, or other people you enjoy being around.Express your feelings and talk things over with someone you trust.  Counseling or talktherapy with a mental health professional may be helpful if you are having difficulty managing stress on your own. Medicine is typically not recommended for the treatment of stress.Talk to your health care provider if you think you need medicine for symptoms of stress. Follow these instructions at home:  Keep all follow-up visits as directed by your health care provider.  Take all medicines as directed by your health care provider. Contact a health care provider if:  Your symptoms get worse or you start having new symptoms.  You feel overwhelmed by your problems and can no longer manage them on your own. Get help right away if:  You feel like hurting yourself or someone else. This information is not intended to replace advice given to you by your health care provider. Make sure you discuss any questions you have with your health care provider. Document Released: 06/28/2000 Document Revised: 06/10/2015 Document Reviewed: 08/27/2012 Elsevier Interactive Patient Education  2017 [employer].

## 2017-04-10 NOTE — Progress Notes (Signed)
Subjective:    Patient ID: Heather [name], female    DOB: September 17, 1955, 62 y.o.   MRN: [id number]  HPI   Heather [name] is here today for annual physical exam and  follow up of chronic medical problem.  Does not need a pap had in 2018  Outpatient Encounter Medications as of 04/10/2017  Medication Sig  . aspirin EC 81 MG tablet Take 81 mg by mouth daily.  . busPIRone (BUSPAR) 30 MG tablet Take 0.5 tablets (15 mg total) by mouth 2 (two) times daily.  . fenofibrate 160 MG tablet TAKE 1 TABLET BY MOUTH ONCE DAILY  . FLUoxetine (PROZAC) 40 MG capsule Take 1 capsule (40 mg total) by mouth daily.  Marland Kitchen levothyroxine (SYNTHROID) 125 MCG tablet Take 1 tablet (125 mcg total) by mouth daily.  Marland Kitchen omeprazole (PRILOSEC) 40 MG capsule Take 1 capsule (40 mg total) by mouth daily.  . valACYclovir (VALTREX) 1000 MG tablet Take 2 tablets 2 times a day for 1 day at fever blister onset     1. Annual physical exam   2. Gastroesophageal reflux disease without esophagitis  She takes prilosec daily to keep symptoms under control  3. Hypothyroidism due to acquired atrophy of thyroid  No problems that she is awareof  4. Current smoker she quit smoking 2 months ago- says she feels better  5. Moderate episode of recurrent major depressive disorder ([place])  Is currently on prozac in which she says works well for her. Denies any medication side effects. Depression screen [facility] 2/9 04/10/2017 01/25/2017 10/18/2016  Decreased Interest 0 0 0  Down, Depressed, Hopeless 0 0 0  PHQ - 2 Score 0 0 0  Altered sleeping - - -  Tired, decreased energy - - -  Change in appetite - - -  Feeling bad or failure about yourself  - - -  Trouble concentrating - - -  Moving slowly or fidgety/restless - - -  Suicidal thoughts - - -  PHQ-9 Score - - -     6. GAD (generalized anxiety disorder)  Is on buspar as needed. Usually just takes 1x per day  7. Hyperlipidemia with target LDL less than 100  Not really watching diet as of late  8.  Obesity (BMI 30-39.9)  No recent weight changes    New complaints: Slight cough and congestion that started about 4 days ago. Denies fever and achiness.  Social history: Patient has had a tknee surgery for torn meniscus since last seen  In office and has been taking physical therapy- she says she is doing well.   Review of Systems  Constitutional: Negative for activity change and appetite change.  HENT: Negative.   Eyes: Negative for pain.  Respiratory: Negative for shortness of breath.   Cardiovascular: Negative for chest pain, palpitations and leg swelling.  Gastrointestinal: Negative for abdominal pain.  Endocrine: Negative for polydipsia.  Genitourinary: Negative.   Skin: Negative for rash.  Neurological: Negative for dizziness, weakness and headaches.  Hematological: Does not bruise/bleed easily.  Psychiatric/Behavioral: Negative.   All other systems reviewed and are negative.      Objective:   Physical Exam  Constitutional: She is oriented to person, place, and time. She appears well-developed and well-nourished.  HENT:  Head: Normocephalic.  Right Ear: Hearing, tympanic membrane, external ear and ear canal normal.  Left Ear: Hearing, tympanic membrane, external ear and ear canal normal.  Nose: Nose normal.  Mouth/Throat: Uvula is midline and oropharynx is clear and moist.  Eyes: Pupils are equal, round, and reactive to [place]. Conjunctivae and EOM are normal.  Neck: Normal range of motion and full passive range of motion without pain. Neck supple. No JVD present. Carotid bruit is not present. No thyroid mass and no thyromegaly present.  Cardiovascular: Normal rate, normal heart sounds and intact distal pulses.  No murmur heard. Pulmonary/Chest: Effort normal and breath sounds normal. Right breast exhibits no inverted nipple, no mass, no nipple discharge, no skin change and no tenderness. Left breast exhibits no inverted nipple, no mass, no nipple discharge, no skin  change and no tenderness. No breast swelling, tenderness, discharge or bleeding.  Abdominal: Soft. Bowel sounds are normal. She exhibits no mass. There is no tenderness.  Genitourinary: No breast swelling, tenderness, discharge or bleeding.  Musculoskeletal: Normal range of motion.  Lymphadenopathy:    She has no cervical adenopathy.  Neurological: She is alert and oriented to person, place, and time.  Skin: Skin is warm and dry.  Psychiatric: She has a normal mood and affect. Her behavior is normal. Judgment and thought content normal.    BP 121/77   Pulse 99   Temp (!) 97.5 F (36.4 C) (Oral)   Ht 5' 4"  (1.626 m)   Wt 175 lb 6.4 oz (79.6 kg)   BMI 30.11 kg/m        Assessment & Plan:  1. Annual physical exam - CBC with Differential/Platelet  2. Gastroesophageal reflux disease without esophagitis Avoid spicy foods Do not eat 2 hours prior to bedtime  3. Hypothyroidism due to acquired atrophy of thyroid - Thyroid Panel With TSH  4. Current smoker Continue to avoid cigarette smoking  5. Moderate episode of recurrent major depressive disorder ([place]) Stress management  6. GAD (generalized anxiety disorder) - busPIRone (BUSPAR) 30 MG tablet; Take 0.5 tablets (15 mg total) by mouth 2 (two) times daily.  Dispense: 30 tablet; Refill: 3  7. Hyperlipidemia with target LDL less than 100 Low fat diet - CMP14+EGFR - Lipid panel  8. Obesity (BMI 30-39.9) Discussed diet and exercise for person with BMI >25 Will recheck weight in 3-6 months    Labs pending Health maintenance reviewed Diet and exercise encouraged Continue all meds Follow up  In 6 months   [place], FNP

## 2017-04-11 ENCOUNTER — Other Ambulatory Visit: Payer: Self-pay | Admitting: Nurse Practitioner

## 2017-04-11 LAB — CBC WITH DIFFERENTIAL/PLATELET
Basophils Absolute: 0 10*3/uL (ref 0.0–0.2)
Basos: 0 %
EOS (ABSOLUTE): 0.2 10*3/uL (ref 0.0–0.4)
Eos: 4 %
Hematocrit: 38.9 % (ref 34.0–46.6)
Hemoglobin: 13.3 g/dL (ref 11.1–15.9)
Immature Grans (Abs): 0 10*3/uL (ref 0.0–0.1)
Immature Granulocytes: 0 %
Lymphocytes Absolute: 1.1 10*3/uL (ref 0.7–3.1)
Lymphs: 22 %
MCH: 29.4 pg (ref 26.6–33.0)
MCHC: 34.2 g/dL (ref 31.5–35.7)
MCV: 86 fL (ref 79–97)
Monocytes Absolute: 0.5 10*3/uL (ref 0.1–0.9)
Monocytes: 10 %
Neutrophils Absolute: 3.3 10*3/uL (ref 1.4–7.0)
Neutrophils: 64 %
Platelets: 288 10*3/uL (ref 150–379)
RBC: 4.53 x10E6/uL (ref 3.77–5.28)
RDW: 13.7 % (ref 12.3–15.4)
WBC: 5.2 10*3/uL (ref 3.4–10.8)

## 2017-04-11 LAB — LIPID PANEL
Chol/HDL Ratio: 4.5 ratio — ABNORMAL HIGH (ref 0.0–4.4)
Cholesterol, Total: 178 mg/dL (ref 100–199)
HDL: 40 mg/dL (ref >39)
LDL Calculated: 113 mg/dL — ABNORMAL HIGH (ref 0–99)
Triglycerides: 127 mg/dL (ref 0–149)
VLDL Cholesterol Cal: 25 mg/dL (ref 5–40)

## 2017-04-11 LAB — CMP14+EGFR
ALT: 13 IU/L (ref 0–32)
AST: 19 IU/L (ref 0–40)
Albumin/Globulin Ratio: 1.6 (ref 1.2–2.2)
Albumin: 4.1 g/dL (ref 3.6–4.8)
Alkaline Phosphatase: 76 IU/L (ref 39–117)
BUN/Creatinine Ratio: 20 (ref 12–28)
BUN: 12 mg/dL (ref 8–27)
Bilirubin Total: 0.3 mg/dL (ref 0.0–1.2)
CO2: 22 mmol/L (ref 20–29)
Calcium: 9.6 mg/dL (ref 8.7–10.3)
Chloride: 102 mmol/L (ref 96–106)
Creatinine, Ser: 0.61 mg/dL (ref 0.57–1.00)
GFR calc Af Amer: 112 mL/min/{1.73_m2} (ref >59)
GFR calc non Af Amer: 97 mL/min/{1.73_m2} (ref >59)
Globulin, Total: 2.6 g/dL (ref 1.5–4.5)
Glucose: 76 mg/dL (ref 65–99)
Potassium: 3.9 mmol/L (ref 3.5–5.2)
Sodium: 139 mmol/L (ref 134–144)
Total Protein: 6.7 g/dL (ref 6.0–8.5)

## 2017-04-11 LAB — THYROID PANEL WITH TSH
Free Thyroxine Index: 3.4 (ref 1.2–4.9)
T3 Uptake Ratio: 30 % (ref 24–39)
T4, Total: 11.4 ug/dL (ref 4.5–12.0)
TSH: 0.017 u[IU]/mL — ABNORMAL LOW (ref 0.450–4.500)

## 2017-04-11 MED ORDER — LEVOTHYROXINE SODIUM 112 MCG PO TABS: 112.0000 ug | ORAL_TABLET | Freq: Every day | ORAL | 11 refills | Status: DC

## 2017-06-28 ENCOUNTER — Other Ambulatory Visit: Payer: Self-pay | Admitting: Nurse Practitioner

## 2017-07-28 ENCOUNTER — Other Ambulatory Visit: Payer: Self-pay | Admitting: Nurse Practitioner

## 2017-07-28 DIAGNOSIS — F411 Generalized anxiety disorder: Secondary | ICD-10-CM

## 2017-08-27 ENCOUNTER — Other Ambulatory Visit: Payer: Self-pay | Admitting: Nurse Practitioner

## 2017-08-27 DIAGNOSIS — F331 Major depressive disorder, recurrent, moderate: Secondary | ICD-10-CM

## 2017-08-27 NOTE — Telephone Encounter (Signed)
Last seen 3.26.19  MMM 

## 2017-09-01 ENCOUNTER — Other Ambulatory Visit: Payer: Self-pay | Admitting: Nurse Practitioner

## 2017-09-01 DIAGNOSIS — K219 Gastro-esophageal reflux disease without esophagitis: Secondary | ICD-10-CM

## 2017-09-29 ENCOUNTER — Other Ambulatory Visit: Payer: Self-pay | Admitting: Nurse Practitioner

## 2017-10-01 NOTE — Telephone Encounter (Signed)
Last refill without being seen 

## 2017-10-01 NOTE — Telephone Encounter (Signed)
Last lipid 04/10/17

## 2017-11-19 ENCOUNTER — Encounter: Payer: Self-pay | Admitting: Family Medicine

## 2017-11-19 ENCOUNTER — Ambulatory Visit: Payer: Managed Care, Other (non HMO) | Admitting: Family Medicine

## 2017-11-19 VITALS — BP 138/80 | HR 92 | Temp 97.5°F | Ht 64.0 in | Wt 183.0 lb

## 2017-11-19 DIAGNOSIS — E034 Atrophy of thyroid (acquired): Secondary | ICD-10-CM

## 2017-11-19 DIAGNOSIS — Z23 Encounter for immunization: Secondary | ICD-10-CM | POA: Diagnosis not present

## 2017-11-19 DIAGNOSIS — F331 Major depressive disorder, recurrent, moderate: Secondary | ICD-10-CM

## 2017-11-19 DIAGNOSIS — E785 Hyperlipidemia, unspecified: Secondary | ICD-10-CM | POA: Diagnosis not present

## 2017-11-19 DIAGNOSIS — K219 Gastro-esophageal reflux disease without esophagitis: Secondary | ICD-10-CM

## 2017-11-19 DIAGNOSIS — B0089 Other herpesviral infection: Secondary | ICD-10-CM

## 2017-11-19 DIAGNOSIS — E669 Obesity, unspecified: Secondary | ICD-10-CM

## 2017-11-19 DIAGNOSIS — F411 Generalized anxiety disorder: Secondary | ICD-10-CM

## 2017-11-19 DIAGNOSIS — L918 Other hypertrophic disorders of the skin: Secondary | ICD-10-CM | POA: Diagnosis not present

## 2017-11-19 LAB — BAYER DCA HB A1C WAIVED: HB A1C (BAYER DCA - WAIVED): 5.6 % (ref <7.0)

## 2017-11-19 MED ORDER — OMEPRAZOLE 40 MG PO CPDR: DELAYED_RELEASE_CAPSULE | ORAL | 3 refills | Status: DC

## 2017-11-19 MED ORDER — FENOFIBRATE 160 MG PO TABS: 160.0000 mg | ORAL_TABLET | Freq: Every day | ORAL | 3 refills | Status: DC

## 2017-11-19 MED ORDER — FLUOXETINE HCL 40 MG PO CAPS: ORAL_CAPSULE | ORAL | 3 refills | Status: DC

## 2017-11-19 MED ORDER — LEVOTHYROXINE SODIUM 112 MCG PO TABS: 112.0000 ug | ORAL_TABLET | Freq: Every day | ORAL | 3 refills | Status: DC

## 2017-11-19 MED ORDER — BUSPIRONE HCL 30 MG PO TABS: ORAL_TABLET | ORAL | 1 refills | Status: DC

## 2017-11-19 MED ORDER — VALACYCLOVIR HCL 1 G PO TABS: ORAL_TABLET | ORAL | 0 refills | Status: DC

## 2017-11-19 NOTE — Progress Notes (Signed)
Subjective: CC: Chronic follow-up for hypothyroidism, depression/anxiety and skin tag PCP: Chevis Pretty, FNP Heather [name] is a 62 y.o. female presenting to clinic today for:  1.  Hypothyroidism Patient reports being diagnosed around 2005.  She denies any history of radiation or surgery to the neck.  She has been compliant with Synthroid and denies any heart palpitations, constipation, diarrhea, difficulty swallowing or change in voice.  No changes in vision.  Family history significant for Graves' disease and a brother as well as hypothyroidism and a deceased sister.  2.  Depression/anxiety Patient reports a greater than 20-year history of depression and anxiety.  She reports compliance with her medications and states that it helps tremendously.  She denies a history of hospitalization for mental health disorder.  No history of SI or HI.  No current SI or HI.  3.  GERD Patient reports longstanding history of acid reflux, that is controlled by omeprazole.  She has history of colonic polyps and has colonoscopy every 3 years.  Family history is significant for colon cancer in her mother who died at age 74.  61.  Skin tag Patient reports a skin tag under her right arm near her bra line that had previously been asymptomatic but recently started hurting and bleeding.  She thinks she may have caught it on her bra but is not sure.  Denies any pus or fevers.  She had to have this removed previously for similar symptoms.  5.  Fever blisters Patient reports history of fever blisters that have been responsive to Valtrex.  She thinks she has about 4 or 5 episodes per year.   ROS: Per HPI  No Known Allergies Past Medical History:  Diagnosis Date  . Anxiety   . Cataract   . Depression   . Fibromyalgia   . GERD (gastroesophageal reflux disease)   . Hyperlipidemia    no meds currently  . Hypothyroidism   . PONV (postoperative nausea and vomiting)   . Thyroid disease    hypo     Current Outpatient Medications:  .  aspirin EC 81 MG tablet, Take 81 mg by mouth daily., Disp: , Rfl:  .  busPIRone (BUSPAR) 30 MG tablet, TAKE 1/2 TABLET (15 MG TOTAL) BY MOUTH 2 (TWO) TIMES DAILY., Disp: 90 tablet, Rfl: 1 .  fenofibrate 160 MG tablet, TAKE 1 TABLET BY MOUTH EVERY DAY, Disp: 90 tablet, Rfl: 0 .  FLUoxetine (PROZAC) 40 MG capsule, TAKE 1 CAPSULE BY MOUTH EVERY DAY, Disp: 90 capsule, Rfl: 0 .  levothyroxine (SYNTHROID) 112 MCG tablet, Take 1 tablet (112 mcg total) by mouth daily., Disp: 30 tablet, Rfl: 11 .  omeprazole (PRILOSEC) 40 MG capsule, TAKE 1 CAPSULE BY MOUTH EVERY DAY, Disp: 90 capsule, Rfl: 0 .  valACYclovir (VALTREX) 1000 MG tablet, Take 2 tablets 2 times a day for 1 day at fever blister onset, Disp: 20 tablet, Rfl: 0 Social History   Socioeconomic History  . Marital status: Married    Spouse name: Not on file  . Number of children: Not on file  . Years of education: Not on file  . Highest education level: Not on file  Occupational History  . Not on file  Social Needs  . Financial resource strain: Not on file  . Food insecurity:    Worry: Not on file    Inability: Not on file  . Transportation needs:    Medical: Not on file    Non-medical: Not on file  Tobacco Use  .  Smoking status: Current Every Day Smoker    Packs/day: 1.00    Years: 25.00    Pack years: 25.00    Types: Cigarettes  . Smokeless tobacco: Never Used  Substance and Sexual Activity  . Alcohol use: No  . Drug use: No  . Sexual activity: Yes    Birth control/protection: Surgical  Lifestyle  . Physical activity:    Days per week: Not on file    Minutes per session: Not on file  . Stress: Not on file  Relationships  . Social connections:    Talks on phone: Not on file    Gets together: Not on file    Attends religious service: Not on file    Active member of club or organization: Not on file    Attends meetings of clubs or organizations: Not on file    Relationship status:  Not on file  . Intimate partner violence:    Fear of current or ex partner: Not on file    Emotionally abused: Not on file    Physically abused: Not on file    Forced sexual activity: Not on file  Other Topics Concern  . Not on file  Social History Narrative  . Not on file   Family History  Problem Relation Age of Onset  . Colon cancer Mother 45  . Rectal cancer Mother   . Colon cancer Other 52  . Esophageal cancer Neg Hx   . Stomach cancer Neg Hx     Objective: Office vital signs reviewed. BP 138/80   Pulse 92   Temp (!) 97.5 F (36.4 C) (Oral)   Ht 5\' 4"  (1.626 m)   Wt 183 lb (83 kg)   BMI 31.41 kg/m   Physical Examination:  General: Awake, alert, well nourished, No acute distress HEENT: Normal, sclera white, MMM.  No exophthalmos. Neck: No goiter or palpable thyroid masses or nodules. Cardio: regular rate and rhythm, S1S2 heard, no murmurs appreciated Pulm: clear to auscultation bilaterally, no wheezes, rhonchi or rales; normal work of breathing on room air Extremities: warm, well perfused, No edema, cyanosis or clubbing; +2 pulses bilaterally MSK: normal gait and station Skin: dry; ~47mm inflamed skin tag seen on right upper back under right axilla. Neuro: No resting tremor noted.  Follows commands.  No focal neurologic deficits. Psych: Mood stable, speech normal, affect appropriate, pleasant and interactive Depression screen [facility] 2/9 11/19/2017 04/10/2017 01/25/2017  Decreased Interest 0 0 0  Down, Depressed, Hopeless 0 0 0  PHQ - 2 Score 0 0 0  Altered sleeping - - -  Tired, decreased energy - - -  Change in appetite - - -  Feeling bad or failure about yourself  - - -  Trouble concentrating - - -  Moving slowly or fidgety/restless - - -  Suicidal thoughts - - -  PHQ-9 Score - - -   GAD 7 : Generalized Anxiety Score 11/19/2017  Nervous, Anxious, on Edge 0  Control/stop worrying 0  Worry too much - different things 0  Trouble relaxing 0  Restless 0  Easily  annoyed or irritable 0  Afraid - awful might happen 0  Total GAD 7 Score 0  Anxiety Difficulty Not difficult at all    Procedure: Skin tag removal Informed consent provided.  Consent sent to pharmacy.  A 3 mm, inflamed skin tag with identified on the right upper back just under the axilla.  Scant bleeding noted but no evidence of secondary infection.  Area  was cleaned x3 with rubbing alcohol swab.  1.5 cc of lidocaine with epinephrine used for local anesthesia.  Once local anesthesia was confirmed, skin tag was excised using sterile scissors.  Less than 1 cc of blood loss noted.  Area was dressed and bandaged.  Home care instructions reviewed with the patient.  Assessment/ Plan: 62 y.o. female   1. Hypothyroidism due to acquired atrophy of thyroid We will check thyroid function study.  Refill of Synthroid has been sent to pharmacy.  Currently asymptomatic. - Thyroid Panel With TSH  2. Obesity (BMI 30-39.9) Check A1c given history of elevation in A1c in the past and strong family history of type 2 diabetes. - Bayer DCA Hb A1c Waived  3. Hyperlipidemia with target LDL less than 100 We discussed that she will be due for fasting labs in March.  Refill of fenofibrate has been sent for hypertriglyceridemia.  4. Gastroesophageal reflux disease without esophagitis Omeprazole refilled.  Currently well controlled. - omeprazole (PRILOSEC) 40 MG capsule; TAKE 1 CAPSULE BY MOUTH EVERY DAY  Dispense: 90 capsule; Refill: 3  5. GAD (generalized anxiety disorder) Well-controlled on fluoxetine and BuSpar. - busPIRone (BUSPAR) 30 MG tablet; TAKE 1/2 TABLET (15 MG TOTAL) BY MOUTH 2 (TWO) TIMES DAILY.  Dispense: 90 tablet; Refill: 1  6. Moderate episode of recurrent major depressive disorder (HCC) Well-controlled - FLUoxetine (PROZAC) 40 MG capsule; TAKE 1 CAPSULE BY MOUTH EVERY DAY  Dispense: 90 capsule; Refill: 3  7. Recurrent oral herpes simplex infection We discussed that if she starts having  more than 5 outbreaks per year this would be an indication for chronic suppression. - valACYclovir (VALTREX) 1000 MG tablet; Take 2 tablets 2 times a day for 1 day at fever blister onset  Dispense: 20 tablet; Refill: 0  8. Need for immunization against influenza Administered - Flu Vaccine QUAD 36+ mos IM  9. Inflamed skin tag Physical exam clinically consistent with a skin tag.  I have sent this off for pathology for completion.  Patient tolerated procedure without difficulty.  Reasons for return discussed.  She will follow-up in 3 months or sooner if needed. - Pathology   Orders Placed This Encounter  Procedures  . Flu Vaccine QUAD 36+ mos IM  . Thyroid Panel With TSH  . Bayer DCA Hb A1c Waived   Meds ordered this encounter  Medications  . busPIRone (BUSPAR) 30 MG tablet    Sig: TAKE 1/2 TABLET (15 MG TOTAL) BY MOUTH 2 (TWO) TIMES DAILY.    Dispense:  90 tablet    Refill:  1  . fenofibrate 160 MG tablet    Sig: Take 1 tablet (160 mg total) by mouth daily.    Dispense:  90 tablet    Refill:  3  . FLUoxetine (PROZAC) 40 MG capsule    Sig: TAKE 1 CAPSULE BY MOUTH EVERY DAY    Dispense:  90 capsule    Refill:  3  . levothyroxine (SYNTHROID) 112 MCG tablet    Sig: Take 1 tablet (112 mcg total) by mouth daily.    Dispense:  90 tablet    Refill:  3  . omeprazole (PRILOSEC) 40 MG capsule    Sig: TAKE 1 CAPSULE BY MOUTH EVERY DAY    Dispense:  90 capsule    Refill:  3  . valACYclovir (VALTREX) 1000 MG tablet    Sig: Take 2 tablets 2 times a day for 1 day at fever blister onset    Dispense:  20 tablet  Refill:  0    This prescription was filled on 04/04/2016. Any refills authorized will be placed on file.     Janora Norlander, DO [place] [phone]

## 2017-11-19 NOTE — Patient Instructions (Addendum)
You had labs performed today.  You will be contacted with the results of the labs once they are available, usually in the next 3 business days for routine lab work.    Skin Tag, Adult A skin tag (acrochordon) is a soft, extra growth of skin. Most skin tags are flesh-colored and rarely bigger than a pencil eraser. They commonly form near areas where there are folds in the skin, such as the armpit or groin. Skin tags are not dangerous, and they do not spread from person to person (are not contagious). You may have one skin tag or several. Skin tags do not require treatment. However, your health care provider may recommend removal of a skin tag if it:  Gets irritated from clothing.  Bleeds.  Is visible and unsightly.  Your health care provider can remove skin tags with a simple surgical procedure or a procedure that involves freezing the skin tag. Follow these instructions at home:  Watch for any changes in your skin tag. A normal skin tag does not require any other special care at home.  Take over-the-counter and prescription medicines only as told by your health care provider.  Keep all follow-up visits as told by your health care provider. This is important. Contact a health care provider if:  You have a skin tag that: ? Becomes painful. ? Changes color. ? Bleeds. ? Swells.  You develop more skin tags. This information is not intended to replace advice given to you by your health care provider. Make sure you discuss any questions you have with your health care provider. Document Released: 01/17/2015 Document Revised: 08/29/2015 Document Reviewed: 01/17/2015 Elsevier Interactive Patient Education  [employer].

## 2017-11-20 ENCOUNTER — Other Ambulatory Visit: Payer: Self-pay | Admitting: Family Medicine

## 2017-11-20 ENCOUNTER — Telehealth: Payer: Self-pay | Admitting: Family Medicine

## 2017-11-20 LAB — THYROID PANEL WITH TSH
Free Thyroxine Index: 2.9 (ref 1.2–4.9)
T3 Uptake Ratio: 28 % (ref 24–39)
T4, Total: 10.4 ug/dL (ref 4.5–12.0)
TSH: 0.405 u[IU]/mL — ABNORMAL LOW (ref 0.450–4.500)

## 2017-11-20 MED ORDER — LEVOTHYROXINE SODIUM 100 MCG PO TABS: 100.0000 ug | ORAL_TABLET | Freq: Every day | ORAL | 0 refills | Status: DC

## 2017-11-20 NOTE — Telephone Encounter (Signed)
Pt aware.

## 2017-11-21 LAB — PATHOLOGY

## 2018-01-01 LAB — HM MAMMOGRAPHY

## 2018-03-16 ENCOUNTER — Other Ambulatory Visit: Payer: Self-pay | Admitting: Family Medicine

## 2018-04-12 ENCOUNTER — Encounter: Payer: Managed Care, Other (non HMO) | Admitting: Family Medicine

## 2018-06-10 ENCOUNTER — Encounter: Payer: Self-pay | Admitting: Gastroenterology

## 2018-06-27 ENCOUNTER — Ambulatory Visit (AMBULATORY_SURGERY_CENTER): Payer: Self-pay

## 2018-06-27 ENCOUNTER — Other Ambulatory Visit: Payer: Self-pay

## 2018-06-27 VITALS — Ht 64.0 in | Wt 172.0 lb

## 2018-06-27 DIAGNOSIS — Z86010 Personal history of colonic polyps: Secondary | ICD-10-CM

## 2018-06-27 DIAGNOSIS — Z800 Family history of malignant neoplasm of digestive organs: Secondary | ICD-10-CM

## 2018-06-27 MED ORDER — NA SULFATE-K SULFATE-MG SULF 17.5-3.13-1.6 GM/177ML PO SOLN: 1.0000 | Freq: Once | ORAL | 0 refills | Status: AC

## 2018-06-27 NOTE — Progress Notes (Signed)
Denies allergies to eggs or soy products. Denies complication of anesthesia or sedation. Denies use of weight loss medication. Denies use of O2.   Emmi instructions given for colonoscopy.  Pre-Visit was conducted by phone due to Covid 19. Instructions were reviewed and mailed to patients confirmed home address. A 15.00 coupon was given to patient. Patient was encouraged to call if she had any questions or concerns regarding instructions.

## 2018-07-01 ENCOUNTER — Encounter: Payer: Self-pay | Admitting: Gastroenterology

## 2018-07-10 ENCOUNTER — Telehealth: Payer: Self-pay | Admitting: Gastroenterology

## 2018-07-10 NOTE — Telephone Encounter (Signed)
Spoke w/patient regarding Covid-19 screening questions °Covid-19 Screening Questions: ° °Do you now or have you had a fever in the last 14 days? no ° °Do you have any respiratory symptoms of shortness of breath or cough now or in the last 14 days? no ° °Do you have any family members or close contacts with diagnosed or suspected Covid-19 in the past 14 days? no ° °Have you been tested for Covid-19 and found to be positive? no ° °Pt made aware of that care partner may wait in the car or come up to the lobby during the procedure but will need to provide their own mask. °

## 2018-07-11 ENCOUNTER — Other Ambulatory Visit: Payer: Self-pay

## 2018-07-11 ENCOUNTER — Ambulatory Visit (AMBULATORY_SURGERY_CENTER): Payer: Managed Care, Other (non HMO) | Admitting: Gastroenterology

## 2018-07-11 ENCOUNTER — Encounter: Payer: Self-pay | Admitting: Gastroenterology

## 2018-07-11 VITALS — BP 114/60 | HR 61 | Temp 98.3°F | Resp 13 | Ht 64.0 in | Wt 183.0 lb

## 2018-07-11 DIAGNOSIS — D128 Benign neoplasm of rectum: Secondary | ICD-10-CM | POA: Diagnosis not present

## 2018-07-11 DIAGNOSIS — Z86010 Personal history of colonic polyps: Secondary | ICD-10-CM | POA: Diagnosis not present

## 2018-07-11 DIAGNOSIS — D123 Benign neoplasm of transverse colon: Secondary | ICD-10-CM

## 2018-07-11 DIAGNOSIS — K635 Polyp of colon: Secondary | ICD-10-CM

## 2018-07-11 DIAGNOSIS — D125 Benign neoplasm of sigmoid colon: Secondary | ICD-10-CM | POA: Diagnosis not present

## 2018-07-11 DIAGNOSIS — D122 Benign neoplasm of ascending colon: Secondary | ICD-10-CM | POA: Diagnosis not present

## 2018-07-11 DIAGNOSIS — D129 Benign neoplasm of anus and anal canal: Secondary | ICD-10-CM

## 2018-07-11 MED ORDER — SODIUM CHLORIDE 0.9 % IV SOLN: 500.0000 mL | INTRAVENOUS | Status: DC

## 2018-07-11 NOTE — Progress Notes (Signed)
Called to room to assist during endoscopic procedure.  Patient ID and intended procedure confirmed with present staff. Received instructions for my participation in the procedure from the performing physician.  

## 2018-07-11 NOTE — Progress Notes (Signed)
Report to PACU, RN, vss, BBS= Clear.  

## 2018-07-11 NOTE — Patient Instructions (Signed)
Thank you for allowing [place] to participate in your care today!  Await pathology results by mail, approximately 2 weeks.  Will make recommendation of next colonoscopy after results, most likely 3 years.  Resume previous diet and medications today.  Return to your normal activities tomorrow.    YOU HAD AN ENDOSCOPIC PROCEDURE TODAY AT [facility]:   Refer to the procedure report that was given to you for any specific questions about what was found during the examination.  If the procedure report does not answer your questions, please call your gastroenterologist to clarify.  If you requested that your care partner not be given the details of your procedure findings, then the procedure report has been included in a sealed envelope for you to review at your convenience later.  YOU SHOULD EXPECT: Some feelings of bloating in the abdomen. Passage of more gas than usual.  Walking can help get rid of the air that was put into your GI tract during the procedure and reduce the bloating. If you had a lower endoscopy (such as a colonoscopy or flexible sigmoidoscopy) you may notice spotting of blood in your stool or on the toilet paper. If you underwent a bowel prep for your procedure, you may not have a normal bowel movement for a few days.  Please Note:  You might notice some irritation and congestion in your nose or some drainage.  This is from the oxygen used during your procedure.  There is no need for concern and it should clear up in a day or so.  SYMPTOMS TO REPORT IMMEDIATELY:   Following lower endoscopy (colonoscopy or flexible sigmoidoscopy):  Excessive amounts of blood in the stool  Significant tenderness or worsening of abdominal pains  Swelling of the abdomen that is new, acute  Fever of 100F or higher   Black, tarry-looking stools  For urgent or emergent issues, a gastroenterologist can be reached at any hour by calling [phone].   DIET:  We do recommend a small  meal at first, but then you may proceed to your regular diet.  Drink plenty of fluids but you should avoid alcoholic beverages for 24 hours.  ACTIVITY:  You should plan to take it easy for the rest of today and you should NOT DRIVE or use heavy machinery until tomorrow (because of the sedation medicines used during the test).    FOLLOW UP: Our staff will call the number listed on your records 48-72 hours following your procedure to check on you and address any questions or concerns that you may have regarding the information given to you following your procedure. If we do not reach you, we will leave a message.  We will attempt to reach you two times.  During this call, we will ask if you have developed any symptoms of COVID 19. If you develop any symptoms (ie: fever, flu-like symptoms, shortness of breath, cough etc.) before then, please call [phone].  If you test positive for Covid 19 in the 2 weeks post procedure, please call and report this information to [place].    If any biopsies were taken you will be contacted by phone or by letter within the next 1-3 weeks.  Please call us at [phone] if you have not heard about the biopsies in 3 weeks.    SIGNATURES/CONFIDENTIALITY: You and/or your care partner have signed paperwork which will be entered into your electronic medical record.  These signatures attest to the fact that that the information above  on your After Visit Summary has been reviewed and is understood.  Full responsibility of the confidentiality of this discharge information lies with you and/or your care-partner.

## 2018-07-11 NOTE — Op Note (Signed)
[facility] Patient Name: Unique Sillas Procedure Date: 07/11/2018 8:27 AM MRN: [id number] Endoscopist: Remo Lipps P. Havery Moros , MD Age: 63 Referring MD:  Date of Birth: [birth year] Gender: Female Account #: [id number] Procedure:                Colonoscopy Indications:              Surveillance: Personal history of adenomatous                            polyps on last colonoscopy 3 years ago, mother with                            colon cancer in age 59s Medicines:                Monitored Anesthesia Care Procedure:                Pre-Anesthesia Assessment:                           - Prior to the procedure, a History and Physical                            was performed, and patient medications and                            allergies were reviewed. The patient's tolerance of                            previous anesthesia was also reviewed. The risks                            and benefits of the procedure and the sedation                            options and risks were discussed with the patient.                            All questions were answered, and informed consent                            was obtained. Prior Anticoagulants: The patient has                            taken no previous anticoagulant or antiplatelet                            agents. ASA Grade Assessment: II - A patient with                            mild systemic disease. After reviewing the risks                            and benefits, the patient was deemed in  satisfactory condition to undergo the procedure.                           After obtaining informed consent, the colonoscope                            was passed under direct vision. Throughout the                            procedure, the patient's blood pressure, pulse, and                            oxygen saturations were monitored continuously. The                            Colonoscope was introduced through  the anus and                            advanced to the the cecum, identified by                            appendiceal orifice and ileocecal valve. The                            colonoscopy was performed without difficulty. The                            patient tolerated the procedure well. The quality                            of the bowel preparation was good. The ileocecal                            valve, appendiceal orifice, and rectum were                            photographed. Scope In: 8:32:51 AM Scope Out: 8:56:39 AM Scope Withdrawal Time: 0 hours 19 minutes 24 seconds  Total Procedure Duration: 0 hours 23 minutes 48 seconds  Findings:                 The perianal and digital rectal examinations were                            normal.                           Three sessile polyps were found in the ascending                            colon. The polyps were 2 to 4 mm in size. These                            polyps were removed with a cold snare. Resection  and retrieval were complete.                           Two sessile polyps were found in the transverse                            colon. The polyps were 3 mm in size. These polyps                            were removed with a cold snare. Resection and                            retrieval were complete.                           A 3 mm polyp was found in the sigmoid colon. The                            polyp was sessile. The polyp was removed with a                            cold snare. Resection and retrieval were complete.                           A diminutive polyp was found in the rectum. The                            polyp was sessile. The polyp was removed with a                            cold snare. Resection and retrieval were complete.                           A few medium-mouthed diverticula were found in the                            sigmoid colon.                            Internal hemorrhoids were found during                            retroflexion. The hemorrhoids were small.                           The exam was otherwise without abnormality. Complications:            No immediate complications. Estimated blood loss:                            Minimal. Estimated Blood Loss:     Estimated blood loss was minimal. Impression:               - Three 2 to 4 mm polyps in the ascending colon,  removed with a cold snare. Resected and retrieved.                           - Two 3 mm polyps in the transverse colon, removed                            with a cold snare. Resected and retrieved.                           - One 3 mm polyp in the sigmoid colon, removed with                            a cold snare. Resected and retrieved.                           - One diminutive polyp in the rectum, removed with                            a cold snare. Resected and retrieved.                           - Diverticulosis in the sigmoid colon.                           - Internal hemorrhoids.                           - The examination was otherwise normal. Recommendation:           - Patient has a contact number available for                            emergencies. The signs and symptoms of potential                            delayed complications were discussed with the                            patient. Return to normal activities tomorrow.                            Written discharge instructions were provided to the                            patient.                           - Resume previous diet.                           - Continue present medications.                           - Await pathology results. Anticipate repeat  colonoscopy in 3 years Carlota Raspberry. Zabella Wease, MD 07/11/2018 9:01:49 AM This report has been signed electronically.

## 2018-07-11 NOTE — Progress Notes (Signed)
Pt's states no medical or surgical changes since previsit or office visit. 

## 2018-07-15 ENCOUNTER — Telehealth: Payer: Self-pay

## 2018-07-15 NOTE — Telephone Encounter (Signed)
  Follow up Call-  Call back number 07/11/2018  Post procedure Call Back phone  # [phone]  Permission to leave phone message No  Some recent data might be hidden     Patient questions:  Do you have a fever, pain , or abdominal swelling? No. Pain Score  0 *  Have you tolerated food without any problems? Yes.    Have you been able to return to your normal activities? Yes.    Do you have any questions about your discharge instructions: Diet   No. Medications  No. Follow up visit  No.  Do you have questions or concerns about your Care? No.  Actions: * If pain score is 4 or above: No action needed, pain <4.  1. Have you developed a fever since your procedure? no  2.   Have you had an respiratory symptoms (SOB or cough) since your procedure? no  3.   Have you tested positive for COVID 19 since your procedure no  4.   Have you had any family members/close contacts diagnosed with the COVID 19 since your procedure?  no   If yes to any of these questions please route to Joylene John, RN and Alphonsa Gin, Therapist, sports.

## 2018-08-16 ENCOUNTER — Other Ambulatory Visit: Payer: Self-pay | Admitting: Family Medicine

## 2018-08-16 DIAGNOSIS — F411 Generalized anxiety disorder: Secondary | ICD-10-CM

## 2018-09-03 ENCOUNTER — Encounter: Payer: Self-pay | Admitting: Family Medicine

## 2018-09-03 ENCOUNTER — Ambulatory Visit (INDEPENDENT_AMBULATORY_CARE_PROVIDER_SITE_OTHER): Payer: Managed Care, Other (non HMO) | Admitting: Family Medicine

## 2018-09-03 ENCOUNTER — Other Ambulatory Visit: Payer: Self-pay

## 2018-09-03 VITALS — BP 119/65 | HR 81 | Temp 97.6°F | Ht 64.0 in | Wt 179.0 lb

## 2018-09-03 DIAGNOSIS — E669 Obesity, unspecified: Secondary | ICD-10-CM

## 2018-09-03 DIAGNOSIS — Z0000 Encounter for general adult medical examination without abnormal findings: Secondary | ICD-10-CM

## 2018-09-03 DIAGNOSIS — E034 Atrophy of thyroid (acquired): Secondary | ICD-10-CM | POA: Diagnosis not present

## 2018-09-03 DIAGNOSIS — E785 Hyperlipidemia, unspecified: Secondary | ICD-10-CM

## 2018-09-03 DIAGNOSIS — F411 Generalized anxiety disorder: Secondary | ICD-10-CM

## 2018-09-03 DIAGNOSIS — Z0001 Encounter for general adult medical examination with abnormal findings: Secondary | ICD-10-CM | POA: Diagnosis not present

## 2018-09-03 DIAGNOSIS — Z720 Tobacco use: Secondary | ICD-10-CM

## 2018-09-03 DIAGNOSIS — F331 Major depressive disorder, recurrent, moderate: Secondary | ICD-10-CM

## 2018-09-03 MED ORDER — CHANTIX STARTING MONTH PAK 0.5 MG X 11 & 1 MG X 42 PO TABS: ORAL_TABLET | ORAL | 0 refills | Status: DC

## 2018-09-03 MED ORDER — VARENICLINE TARTRATE 1 MG PO TABS: 1.0000 mg | ORAL_TABLET | Freq: Two times a day (BID) | ORAL | 2 refills | Status: DC

## 2018-09-03 NOTE — Progress Notes (Signed)
Heather [name] is a 63 y.o. female presents to office today for annual physical exam examination.    Concerns today include: 1.  Hypothyroidism Patient reports compliance with Synthroid 100 mcg daily.  Does not endorse any change in voice, difficulty swallowing, heart palpitations, unplanned weight loss.  Occupation: Retired from [employer], Marital status: Married, Substance use: Smokes 1 pack/day (previously failed patches, chewing gum) Diet: Fair, Exercise: No structured Last eye exam: Up-to-date Last dental exam: Regular Last colonoscopy: 06/2018.  Recheck in 3 years Last mammogram: Up-to-date Last pap smear: Not due until 2021 Immunizations needed: Up-to-date  Past Medical History:  Diagnosis Date  . Anxiety   . Arthritis   . Cataract   . Depression   . Fibromyalgia   . GERD (gastroesophageal reflux disease)   . Hyperlipidemia    no meds currently  . Hypothyroidism   . PONV (postoperative nausea and vomiting)   . Thyroid disease    hypo   Social History   Socioeconomic History  . Marital status: Married    Spouse name: Not on file  . Number of children: Not on file  . Years of education: Not on file  . Highest education level: Not on file  Occupational History  . Not on file  Social Needs  . Financial resource strain: Not on file  . Food insecurity    Worry: Not on file    Inability: Not on file  . Transportation needs    Medical: Not on file    Non-medical: Not on file  Tobacco Use  . Smoking status: Current Every Day Smoker    Packs/day: 1.00    Years: 25.00    Pack years: 25.00    Types: Cigarettes  . Smokeless tobacco: Never Used  Substance and Sexual Activity  . Alcohol use: No  . Drug use: No  . Sexual activity: Yes    Birth control/protection: Surgical  Lifestyle  . Physical activity    Days per week: Not on file    Minutes per session: Not on file  . Stress: Not on file  Relationships  . Social Herbalist on phone: Not on file    Gets together: Not on file    Attends religious service: Not on file    Active member of club or organization: Not on file    Attends meetings of clubs or organizations: Not on file    Relationship status: Not on file  . Intimate partner violence    Fear of current or ex partner: Not on file    Emotionally abused: Not on file    Physically abused: Not on file    Forced sexual activity: Not on file  Other Topics Concern  . Not on file  Social History Narrative  . Not on file   Past Surgical History:  Procedure Laterality Date  . CATARACT EXTRACTION Left   . CATARACT EXTRACTION W/PHACO Right 12/29/2013   Procedure: CATARACT EXTRACTION PHACO AND INTRAOCULAR LENS PLACEMENT (IOC);  Surgeon: Tonny Branch, MD;  Location: [facility];  Service: Ophthalmology;  Laterality: Right;  CDE:4.84  . COLONOSCOPY    . EYE SURGERY     cleaned debris from behind eye.  Marland Kitchen FOOT FRACTURE SURGERY Left   . KNEE SURGERY Right   . TUBAL LIGATION    . tubal ligation reversal    . VAGINAL HYSTERECTOMY     Family History  Problem Relation Age of Onset  . Colon cancer Mother 16  .  Rectal cancer Mother   . Colon cancer Other 87  . Heart attack Father 27  . Diabetes Sister   . Leukemia Sister   . Diabetes Brother   . Heart attack Brother 40  . Hyperlipidemia Brother   . Diabetes Sister   . Heart disease Sister   . Diabetes Sister   . Thyroid disease Sister   . Diabetes Brother   . Mental illness Brother   . Graves' disease Brother   . Diabetes Brother   . Diabetes Brother   . CAD Brother   . Healthy Son   . Esophageal cancer Neg Hx   . Stomach cancer Neg Hx     Current Outpatient Medications:  .  busPIRone (BUSPAR) 30 MG tablet, Take 0.5 tablets (15 mg total) by mouth 2 (two) times a day. (Needs to be seen before next refill), Disp: 30 tablet, Rfl: 0 .  fenofibrate 160 MG tablet, Take 1 tablet (160 mg total) by mouth daily., Disp: 90 tablet, Rfl: 3 .  FLUoxetine (PROZAC) 40 MG capsule, TAKE 1  CAPSULE BY MOUTH EVERY DAY, Disp: 90 capsule, Rfl: 3 .  levothyroxine (SYNTHROID, LEVOTHROID) 100 MCG tablet, TAKE 1 TABLET BY MOUTH EVERY DAY, Disp: 90 tablet, Rfl: 2 .  omeprazole (PRILOSEC) 40 MG capsule, TAKE 1 CAPSULE BY MOUTH EVERY DAY, Disp: 90 capsule, Rfl: 3 .  valACYclovir (VALTREX) 1000 MG tablet, Take 2 tablets 2 times a day for 1 day at fever blister onset, Disp: 20 tablet, Rfl: 0  No Known Allergies   ROS: Review of Systems Constitutional: negative Eyes: negative Ears, nose, mouth, throat, and face: negative Respiratory: negative Cardiovascular: negative Gastrointestinal: negative Genitourinary:negative Integument/breast: negative Hematologic/lymphatic: negative Musculoskeletal:negative Neurological: negative Behavioral/Psych: negative Endocrine: negative Allergic/Immunologic: negative    Physical exam BP 119/65   Pulse 81   Temp 97.6 F (36.4 C) (Temporal)   Ht _0  (1.626 m)   Wt 179 lb (81.2 kg)   BMI 30.73 kg/m  General appearance: alert, cooperative and appears stated age Head: Normocephalic, without obvious abnormality, atraumatic Eyes: negative findings: lids and lashes normal, conjunctivae and sclerae normal, corneas clear and pupils equal, round, reactive to Bernardi and accomodation Ears: normal TM's and external ear canals both ears Nose: Nares normal. Septum midline. Mucosa normal. No drainage or sinus tenderness. Throat: lips, mucosa, and tongue normal; teeth and gums normal and no oral masses noted Neck: no adenopathy, no carotid bruit, no JVD, supple, symmetrical, trachea midline and thyroid not enlarged, symmetric, no tenderness/mass/nodules Back: symmetric, no curvature. ROM normal. No CVA tenderness. Lungs: clear to auscultation bilaterally Heart: regular rate and rhythm, S1, S2 normal, no murmur, click, rub or gallop Abdomen: soft, non-tender; bowel sounds normal; no masses,  no organomegaly Extremities: extremities normal, atraumatic, no  cyanosis or edema Pulses: 2+ and symmetric Skin: Skin color, texture, turgor normal. No rashes or lesions or There is son spots noted Lymph nodes: Cervical, supraclavicular, and axillary nodes normal. Neurologic: Alert and oriented X 3, normal strength and tone. Normal symmetric reflexes. Normal coordination and gait Psych: Mood stable, speech normal, affect appropriate, pleasant and interactive Depression screen [facility] 2/9 09/03/2018 11/19/2017 04/10/2017  Decreased Interest 0 0 0  Down, Depressed, Hopeless 0 0 0  PHQ - 2 Score 0 0 0  Altered sleeping 0 - -  Tired, decreased energy 0 - -  Change in appetite 0 - -  Feeling bad or failure about yourself  0 - -  Trouble concentrating 0 - -  Moving  slowly or fidgety/restless 0 - -  Suicidal thoughts 0 - -  PHQ-9 Score 0 - -   Assessment/ Plan: [name] here for annual physical exam.   1. Annual physical exam Plan for influenza shot in October  2. Hypothyroidism due to acquired atrophy of thyroid Check thyroid panel given suppressed TSH at last visit, may need further change of thyroid dose - CMP14+EGFR - Thyroid Panel With TSH  3. Hyperlipidemia with target LDL less than 100 Check nonfasting lipid panel and LFTs - CMP14+EGFR - Lipid Panel  4. Obesity (BMI 30-39.9) Encouraged physical activity.  Handout provided  5. Moderate episode of recurrent major depressive disorder (HCC) Controlled  6. GAD (generalized anxiety disorder) Controlled  7. Tobacco use Contemplative.  I have given her prescription for Chantix and discussed the possible side effects of the medication. - varenicline (CHANTIX CONTINUING MONTH PAK) 1 MG tablet; Take 1 tablet (1 mg total) by mouth 2 (two) times daily.  Dispense: 60 tablet; Refill: 2 - varenicline (CHANTIX STARTING MONTH PAK) 0.5 MG X 11 & 1 MG X 42 tablet; Take 0.5 mg tablet by mouth once daily x3 days, then 0.5 mg tablet twice daily x4 days, then increase to one 1 mg tablet twice daily.   Dispense: 53 tablet; Refill: 0   Counseled on healthy lifestyle choices, including diet (rich in fruits, vegetables and lean meats and low in salt and simple carbohydrates) and exercise (at least 30 minutes of moderate physical activity daily).  Patient to follow up in 1 year for annual exam or sooner if needed.   M. Lajuana Ripple, DO

## 2018-09-03 NOTE — Patient Instructions (Signed)
Pap smear will be due next year.  Flu shots will be here in October.  Call to schedule a visit for this vaccine.  You had labs performed today.  You will be contacted with the results of the labs once they are available, usually in the next 3 business days for routine lab work.  If you have an active my chart account, they will be released to your MyChart.  If you prefer to have these labs released to you via telephone, please let us know.  If you had a pap smear or biopsy performed, expect to be contacted in about 7-10 days.   Health Maintenance, Female Adopting a healthy lifestyle and getting preventive care are important in promoting health and wellness. Ask your health care provider about:  The right schedule for you to have regular tests and exams.  Things you can do on your own to prevent diseases and keep yourself healthy. What should I know about diet, weight, and exercise? Eat a healthy diet   Eat a diet that includes plenty of vegetables, fruits, low-fat dairy products, and lean protein.  Do not eat a lot of foods that are high in solid fats, added sugars, or sodium. Maintain a healthy weight Body mass index (BMI) is used to identify weight problems. It estimates body fat based on height and weight. Your health care provider can help determine your BMI and help you achieve or maintain a healthy weight. Get regular exercise Get regular exercise. This is one of the most important things you can do for your health. Most adults should:  Exercise for at least 150 minutes each week. The exercise should increase your heart rate and make you sweat (moderate-intensity exercise).  Do strengthening exercises at least twice a week. This is in addition to the moderate-intensity exercise.  Spend less time sitting. Even [place] physical activity can be beneficial. Watch cholesterol and blood lipids Have your blood tested for lipids and cholesterol at 64 years of age, then have this test every  5 years. Have your cholesterol levels checked more often if:  Your lipid or cholesterol levels are high.  You are older than 64 years of age.  You are at high risk for heart disease. What should I know about cancer screening? Depending on your health history and family history, you may need to have cancer screening at various ages. This may include screening for:  Breast cancer.  Cervical cancer.  Colorectal cancer.  Skin cancer.  Lung cancer. What should I know about heart disease, diabetes, and high blood pressure? Blood pressure and heart disease  High blood pressure causes heart disease and increases the risk of stroke. This is more likely to develop in people who have high blood pressure readings, are of African descent, or are overweight.  Have your blood pressure checked: ? Every 3-5 years if you are 40-32 years of age. ? Every year if you are 45 years old or older. Diabetes Have regular diabetes screenings. This checks your fasting blood sugar level. Have the screening done:  Once every three years after age 16 if you are at a normal weight and have a low risk for diabetes.  More often and at a younger age if you are overweight or have a high risk for diabetes. What should I know about preventing infection? Hepatitis B If you have a higher risk for hepatitis B, you should be screened for this virus. Talk with your health care provider to find out if you  are at risk for hepatitis B infection. Hepatitis C Testing is recommended for:  Everyone born from [birth year] through 1966.  Anyone with known risk factors for hepatitis C. Sexually transmitted infections (STIs)  Get screened for STIs, including gonorrhea and chlamydia, if: ? You are sexually active and are younger than 64 years of age. ? You are older than 64 years of age and your health care provider tells you that you are at risk for this type of infection. ? Your sexual activity has changed since you were last  screened, and you are at increased risk for chlamydia or gonorrhea. Ask your health care provider if you are at risk.  Ask your health care provider about whether you are at high risk for HIV. Your health care provider may recommend a prescription medicine to help prevent HIV infection. If you choose to take medicine to prevent HIV, you should first get tested for HIV. You should then be tested every 3 months for as long as you are taking the medicine. Pregnancy  If you are about to stop having your period (premenopausal) and you may become pregnant, seek counseling before you get pregnant.  Take 400 to 800 micrograms (mcg) of folic acid every day if you become pregnant.  Ask for birth control (contraception) if you want to prevent pregnancy. Osteoporosis and menopause Osteoporosis is a disease in which the bones lose minerals and strength with aging. This can result in bone fractures. If you are 26 years old or older, or if you are at risk for osteoporosis and fractures, ask your health care provider if you should:  Be screened for bone loss.  Take a calcium or vitamin D supplement to lower your risk of fractures.  Be given hormone replacement therapy (HRT) to treat symptoms of menopause. Follow these instructions at home: Lifestyle  Do not use any products that contain nicotine or tobacco, such as cigarettes, e-cigarettes, and chewing tobacco. If you need help quitting, ask your health care provider.  Do not use street drugs.  Do not share needles.  Ask your health care provider for help if you need support or information about quitting drugs. Alcohol use  Do not drink alcohol if: ? Your health care provider tells you not to drink. ? You are pregnant, may be pregnant, or are planning to become pregnant.  If you drink alcohol: ? Limit how much you use to 0-1 drink a day. ? Limit intake if you are breastfeeding.  Be aware of how much alcohol is in your drink. In the [place] one  drink equals one 12 oz bottle of beer (355 mL), one 5 oz glass of wine (148 mL), or one 1 oz glass of hard liquor (44 mL). General instructions  Schedule regular health, dental, and eye exams.  Stay current with your vaccines.  Tell your health care provider if: ? You often feel depressed. ? You have ever been abused or do not feel safe at home. Summary  Adopting a healthy lifestyle and getting preventive care are important in promoting health and wellness.  Follow your health care provider's instructions about healthy diet, exercising, and getting tested or screened for diseases.  Follow your health care provider's instructions on monitoring your cholesterol and blood pressure. This information is not intended to replace advice given to you by your health care provider. Make sure you discuss any questions you have with your health care provider. Document Released: 07/18/2010 Document Revised: 12/26/2017 Document Reviewed: 12/26/2017 Elsevier Patient Education  2020 Elsevier Inc.  

## 2018-09-04 ENCOUNTER — Other Ambulatory Visit: Payer: Self-pay | Admitting: Family Medicine

## 2018-09-04 DIAGNOSIS — F331 Major depressive disorder, recurrent, moderate: Secondary | ICD-10-CM

## 2018-09-04 DIAGNOSIS — F411 Generalized anxiety disorder: Secondary | ICD-10-CM

## 2018-09-04 LAB — CMP14+EGFR
ALT: 11 IU/L (ref 0–32)
AST: 19 IU/L (ref 0–40)
Albumin/Globulin Ratio: 1.8 (ref 1.2–2.2)
Albumin: 4.4 g/dL (ref 3.8–4.8)
Alkaline Phosphatase: 112 IU/L (ref 39–117)
BUN/Creatinine Ratio: 14 (ref 12–28)
BUN: 11 mg/dL (ref 8–27)
Bilirubin Total: 0.2 mg/dL (ref 0.0–1.2)
CO2: 22 mmol/L (ref 20–29)
Calcium: 10 mg/dL (ref 8.7–10.3)
Chloride: 103 mmol/L (ref 96–106)
Creatinine, Ser: 0.77 mg/dL (ref 0.57–1.00)
GFR calc Af Amer: 95 mL/min/{1.73_m2} (ref >59)
GFR calc non Af Amer: 82 mL/min/{1.73_m2} (ref >59)
Globulin, Total: 2.5 g/dL (ref 1.5–4.5)
Glucose: 83 mg/dL (ref 65–99)
Potassium: 4.5 mmol/L (ref 3.5–5.2)
Sodium: 139 mmol/L (ref 134–144)
Total Protein: 6.9 g/dL (ref 6.0–8.5)

## 2018-09-04 LAB — THYROID PANEL WITH TSH
Free Thyroxine Index: 2.6 (ref 1.2–4.9)
T3 Uptake Ratio: 26 % (ref 24–39)
T4, Total: 9.9 ug/dL (ref 4.5–12.0)
TSH: 2.99 u[IU]/mL (ref 0.450–4.500)

## 2018-09-04 LAB — LIPID PANEL
Chol/HDL Ratio: 4.9 ratio — ABNORMAL HIGH (ref 0.0–4.4)
Cholesterol, Total: 211 mg/dL — ABNORMAL HIGH (ref 100–199)
HDL: 43 mg/dL (ref >39)
LDL Calculated: 133 mg/dL — ABNORMAL HIGH (ref 0–99)
Triglycerides: 177 mg/dL — ABNORMAL HIGH (ref 0–149)
VLDL Cholesterol Cal: 35 mg/dL (ref 5–40)

## 2018-09-04 MED ORDER — BUSPIRONE HCL 30 MG PO TABS: 15.0000 mg | ORAL_TABLET | Freq: Two times a day (BID) | ORAL | 1 refills | Status: DC

## 2018-09-04 MED ORDER — FLUOXETINE HCL 40 MG PO CAPS: ORAL_CAPSULE | ORAL | 1 refills | Status: DC

## 2018-09-05 ENCOUNTER — Other Ambulatory Visit: Payer: Self-pay | Admitting: *Deleted

## 2018-09-05 MED ORDER — ATORVASTATIN CALCIUM 20 MG PO TABS: 20.0000 mg | ORAL_TABLET | Freq: Every day | ORAL | 3 refills | Status: DC

## 2018-09-13 ENCOUNTER — Encounter: Payer: Managed Care, Other (non HMO) | Admitting: Family Medicine

## 2018-10-07 ENCOUNTER — Telehealth: Payer: Self-pay | Admitting: *Deleted

## 2018-10-07 MED ORDER — LEVOTHYROXINE SODIUM 100 MCG PO TABS: 100.0000 ug | ORAL_TABLET | Freq: Every day | ORAL | 3 refills | Status: DC

## 2018-10-07 NOTE — Telephone Encounter (Signed)
[facility] Pt changing pharmacy's needs refill on Levothyroxine 100 mcg Refill sent to pharmacy

## 2019-01-21 ENCOUNTER — Other Ambulatory Visit: Payer: Self-pay | Admitting: Family Medicine

## 2019-01-21 DIAGNOSIS — K219 Gastro-esophageal reflux disease without esophagitis: Secondary | ICD-10-CM

## 2019-01-21 DIAGNOSIS — F331 Major depressive disorder, recurrent, moderate: Secondary | ICD-10-CM

## 2019-02-04 IMAGING — DX DG KNEE 1-2V*R*
2 series · 2 of 2 positions shown · non-contrast
Comparison: None.

CLINICAL DATA: Pain

EXAM:
RIGHT KNEE - 1-2 VIEW

[knee ap]
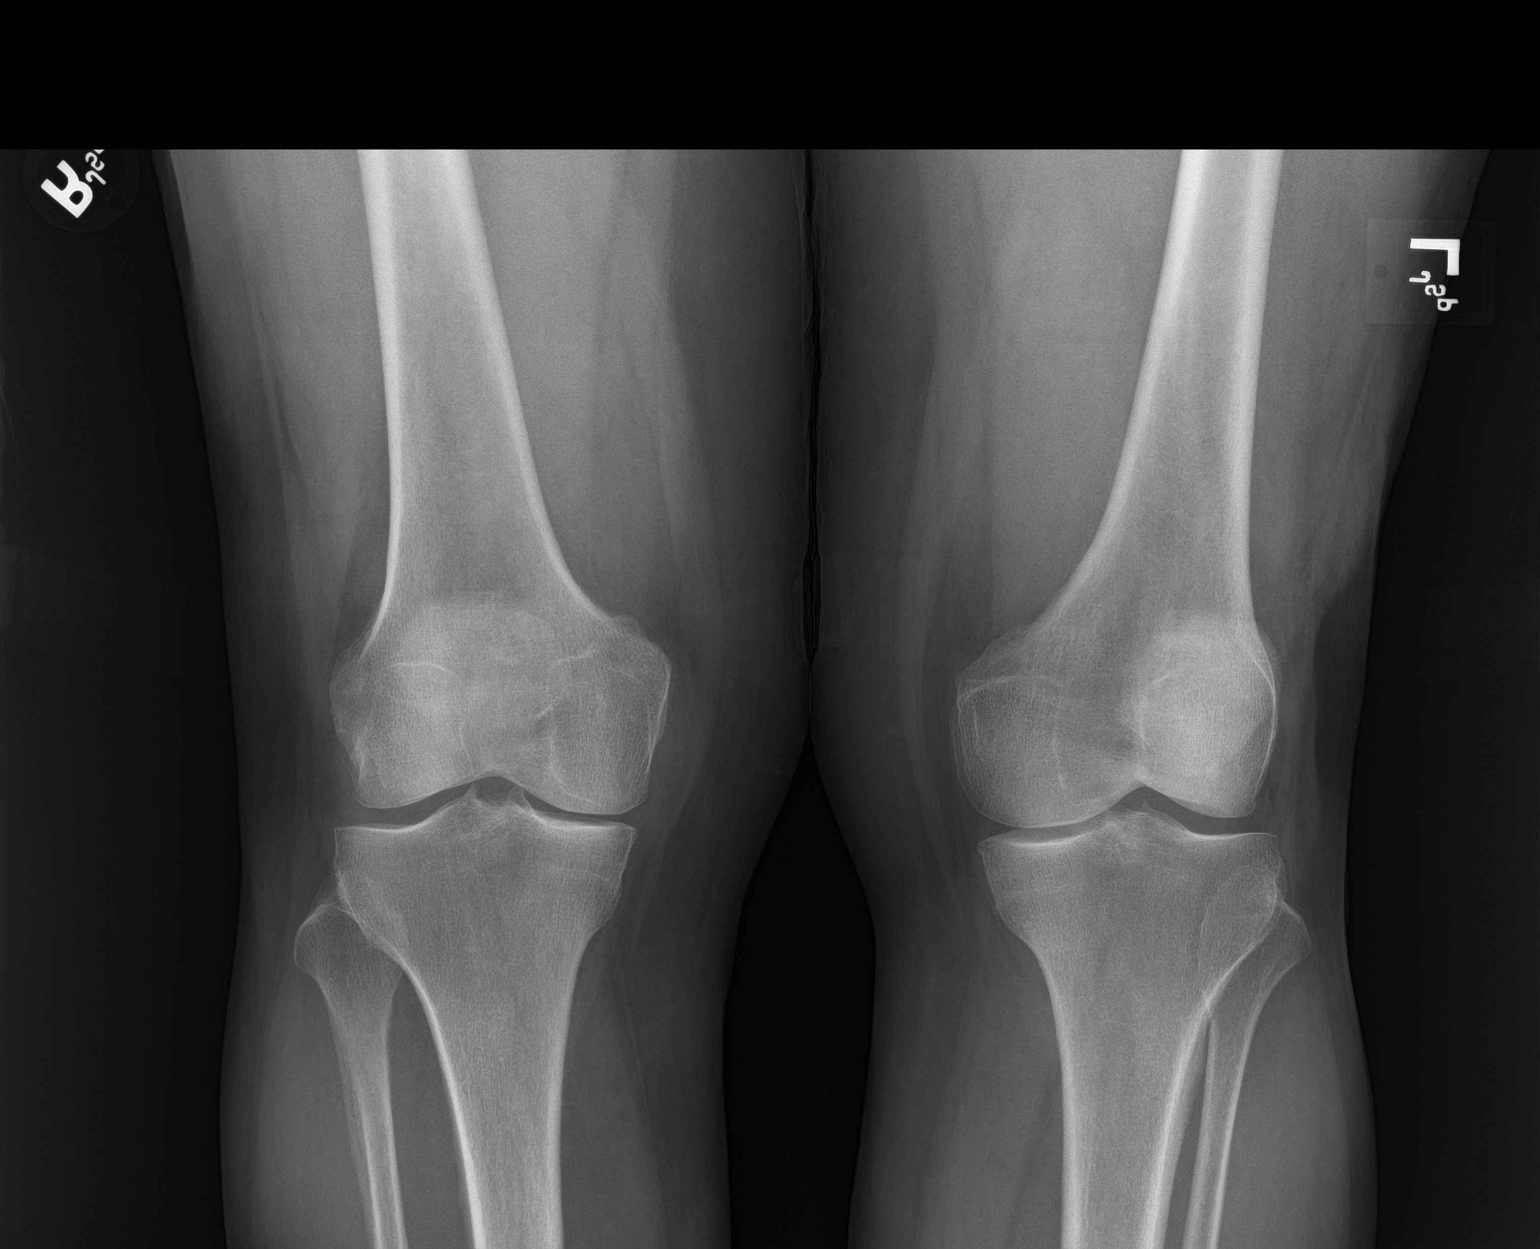

[knee lat]
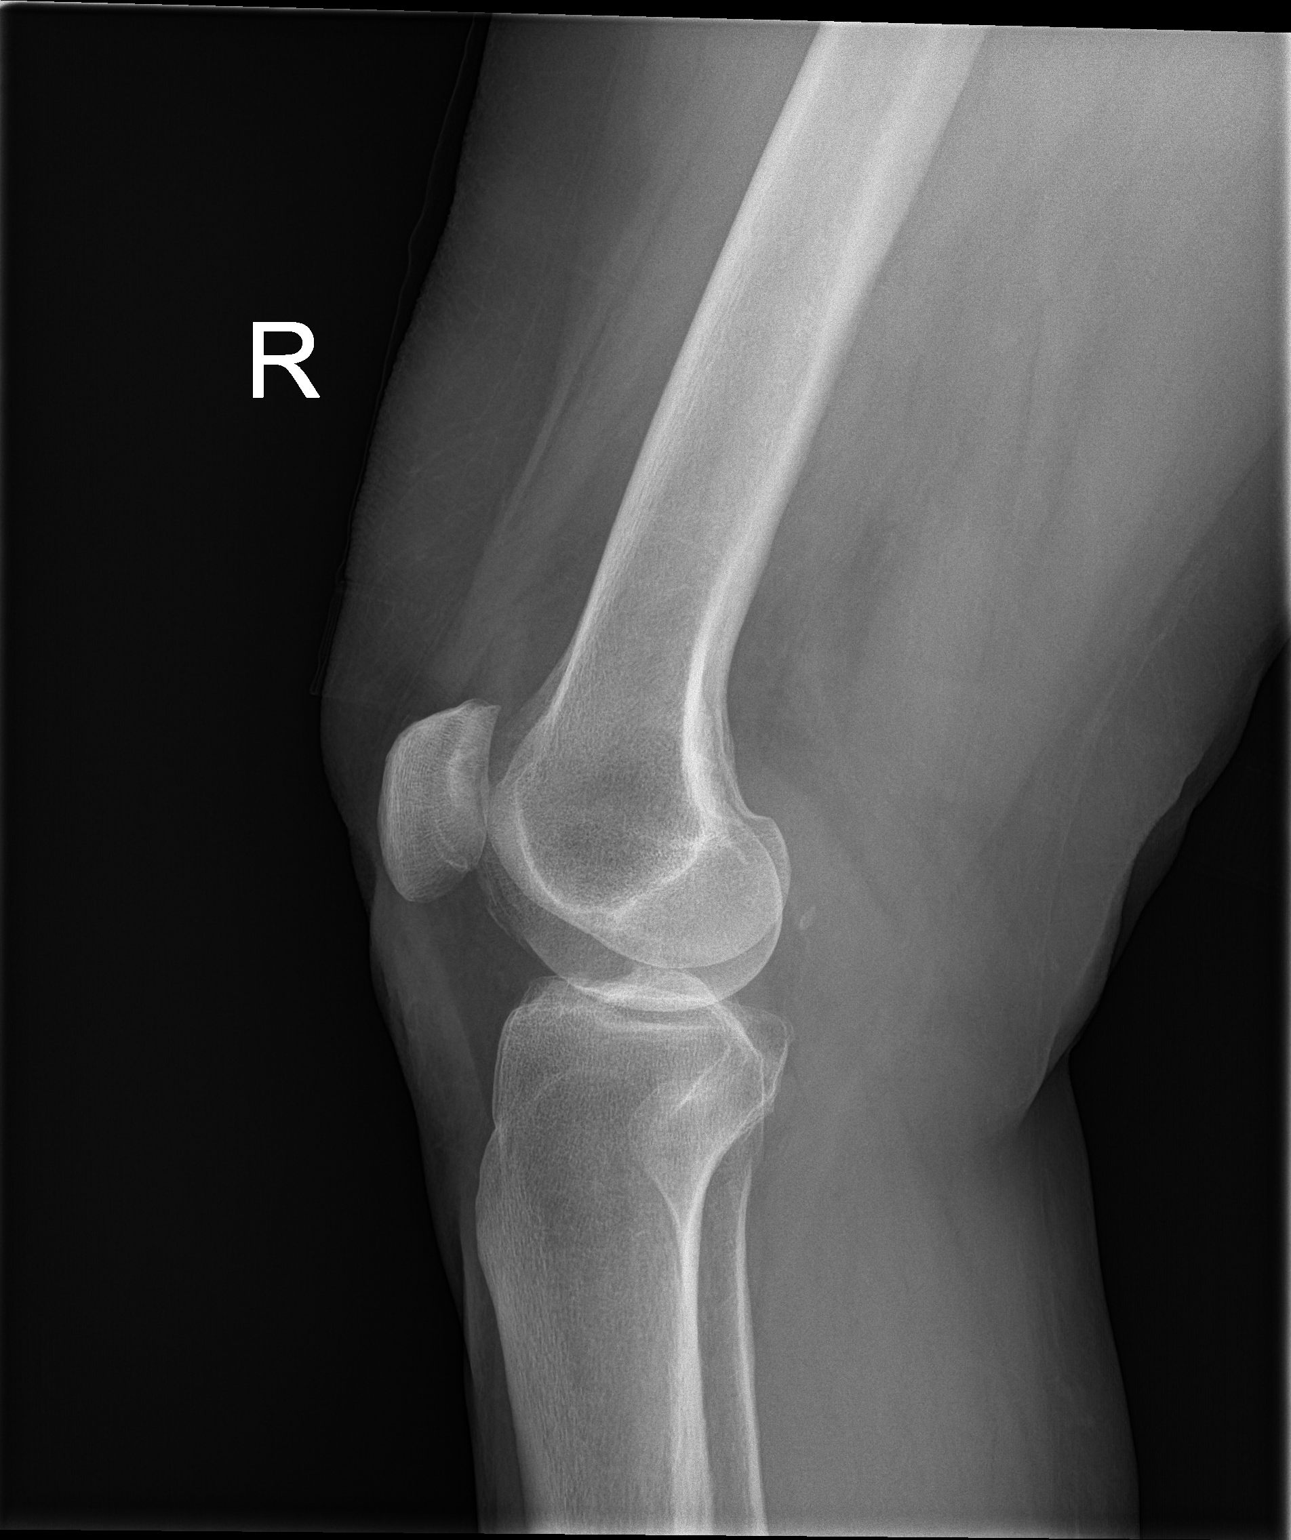

[2 of 2 positions shown; findings below may reference images not displayed]

FINDINGS: Frontal and lateral views of the right knee were obtained. Frontal
view of left knee also obtained.

Right knee: There is no fracture or dislocation. There is a small
joint effusion. There is narrowing medially and in the
patellofemoral joint regions. No erosive change.

AP left knee: There is narrowing medially. No fracture or
dislocation.
IMPRESSION: Right knee: Small joint effusion. Narrowing of the medial
compartment and patellofemoral joint regions. No fracture or
dislocation.

AP left knee: Narrowing medially. No fracture or dislocation
evident.

## 2019-05-01 ENCOUNTER — Other Ambulatory Visit: Payer: Self-pay | Admitting: Family Medicine

## 2019-05-01 DIAGNOSIS — K219 Gastro-esophageal reflux disease without esophagitis: Secondary | ICD-10-CM

## 2019-05-01 DIAGNOSIS — F331 Major depressive disorder, recurrent, moderate: Secondary | ICD-10-CM

## 2019-05-05 ENCOUNTER — Ambulatory Visit (INDEPENDENT_AMBULATORY_CARE_PROVIDER_SITE_OTHER): Payer: 59 | Admitting: Family Medicine

## 2019-05-05 DIAGNOSIS — F411 Generalized anxiety disorder: Secondary | ICD-10-CM

## 2019-05-05 DIAGNOSIS — B0089 Other herpesviral infection: Secondary | ICD-10-CM

## 2019-05-05 DIAGNOSIS — E785 Hyperlipidemia, unspecified: Secondary | ICD-10-CM | POA: Diagnosis not present

## 2019-05-05 DIAGNOSIS — E034 Atrophy of thyroid (acquired): Secondary | ICD-10-CM

## 2019-05-05 DIAGNOSIS — Z720 Tobacco use: Secondary | ICD-10-CM | POA: Diagnosis not present

## 2019-05-05 DIAGNOSIS — K219 Gastro-esophageal reflux disease without esophagitis: Secondary | ICD-10-CM

## 2019-05-05 DIAGNOSIS — F331 Major depressive disorder, recurrent, moderate: Secondary | ICD-10-CM

## 2019-05-05 MED ORDER — BUSPIRONE HCL 30 MG PO TABS: 15.0000 mg | ORAL_TABLET | Freq: Two times a day (BID) | ORAL | 3 refills | Status: DC

## 2019-05-05 MED ORDER — VALACYCLOVIR HCL 1 G PO TABS: ORAL_TABLET | ORAL | 0 refills | Status: DC

## 2019-05-05 MED ORDER — FLUOXETINE HCL 40 MG PO CAPS: 40.0000 mg | ORAL_CAPSULE | Freq: Every day | ORAL | 3 refills | Status: DC

## 2019-05-05 MED ORDER — OMEPRAZOLE 40 MG PO CPDR: 40.0000 mg | DELAYED_RELEASE_CAPSULE | Freq: Every day | ORAL | 3 refills | Status: DC

## 2019-05-05 MED ORDER — ATORVASTATIN CALCIUM 20 MG PO TABS: 20.0000 mg | ORAL_TABLET | Freq: Every day | ORAL | 3 refills | Status: DC

## 2019-05-05 NOTE — Progress Notes (Signed)
Telephone visit  Subjective: CC: thyroid disorder, mood PCP: Janora Norlander, DO VOZ:DGUY [place] is a 64 y.o. female calls for telephone consult today. Patient provides verbal consent for consult held via phone.  Due to COVID-19 pandemic this visit was conducted virtually. This visit type was conducted due to national recommendations for restrictions regarding the COVID-19 Pandemic (e.g. social distancing, sheltering in place) in an effort to limit this patient's exposure and mitigate transmission in our community. All issues noted in this document were discussed and addressed.  A physical exam was not performed with this format.   Location of patient: home Location of provider: [facility] Others present for call: none  1. Thyroid disorder Patient reports compliance with Euthyrox 100 mcg daily.  This was a change from her typical levothyroxine.  She denies any change in voice, difficulty swallowing, tremor, change in weight or bowel habits.  Overall she feels fine.  2. Mood disorder Reports good control of mood with BuSpar 15 mg twice daily and fluoxetine 40 mg daily.  3.  Hyperlipidemia/tobacco use disorder Patient reports compliance with Lipitor 20 mg daily.  Does not report a chest pain, shortness of breath.  She does report that she continues to smoke and never started Chantix due to lapse in insurance.  She is considering nicotine patches.  4.  GERD Patient reports good control of acid reflux with omeprazole 40 mg daily.  Does not report any GI bleeding, nausea or vomiting.  5.  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  Her last prescription was never picked up because she still had some at home.  However, she notes that she is about out and would like to get a refill on this.  No active lesions.  ROS: Per HPI  No Known Allergies Past Medical History:  Diagnosis Date  . Anxiety   . Arthritis   . Cataract   . Depression   . Fibromyalgia   . GERD (gastroesophageal  reflux disease)   . Hyperlipidemia    no meds currently  . Hypothyroidism   . PONV (postoperative nausea and vomiting)   . Thyroid disease    hypo    Current Outpatient Medications:  .  levothyroxine (SYNTHROID) 100 MCG tablet, Take 100 mcg by mouth daily before breakfast. Euthyrox, Disp: , Rfl:  .  atorvastatin (LIPITOR) 20 MG tablet, Take 1 tablet (20 mg total) by mouth daily., Disp: 90 tablet, Rfl: 3 .  busPIRone (BUSPAR) 30 MG tablet, Take 0.5 tablets (15 mg total) by mouth 2 (two) times daily., Disp: 90 tablet, Rfl: 3 .  FLUoxetine (PROZAC) 40 MG capsule, Take 1 capsule (40 mg total) by mouth daily., Disp: 90 capsule, Rfl: 3 .  omeprazole (PRILOSEC) 40 MG capsule, Take 1 capsule (40 mg total) by mouth daily., Disp: 90 capsule, Rfl: 3 .  valACYclovir (VALTREX) 1000 MG tablet, Take 2 tablets 2 times a day for 1 day at fever blister onset, Disp: 20 tablet, Rfl: 0  Assessment/ Plan: 64 y.o. female   1. Hypothyroidism due to acquired atrophy of thyroid Asymptomatic.  Check thyroid panel - Thyroid Panel With TSH; Future  2. Hyperlipidemia with target LDL less than 100 She will come in for fasting lipid panel - Lipid panel; Future - CMP14+EGFR; Future - atorvastatin (LIPITOR) 20 MG tablet; Take 1 tablet (20 mg total) by mouth daily.  Dispense: 90 tablet; Refill: 3  3. Tobacco use Counseling.  She is contemplative and considering nicotine patches  4. GAD (generalized anxiety  disorder) Stable.  Continue buspirone - busPIRone (BUSPAR) 30 MG tablet; Take 0.5 tablets (15 mg total) by mouth 2 (two) times daily.  Dispense: 90 tablet; Refill: 3  5. Moderate episode of recurrent major depressive disorder (HCC) Stable.  Continue Prozac - FLUoxetine (PROZAC) 40 MG capsule; Take 1 capsule (40 mg total) by mouth daily.  Dispense: 90 capsule; Refill: 3  6. Gastroesophageal reflux disease without esophagitis Stable.  Continue PPI - omeprazole (PRILOSEC) 40 MG capsule; Take 1 capsule (40 mg  total) by mouth daily.  Dispense: 90 capsule; Refill: 3  7. Recurrent oral herpes simplex infection Currently asymptomatic but current Rx has expired.  Would like to have a medication on file as needed. - valACYclovir (VALTREX) 1000 MG tablet; Take 2 tablets 2 times a day for 1 day at fever blister onset  Dispense: 20 tablet; Refill: 0   Start time: 3:18pm End time: 3:31pm  Total time spent on patient care (including telephone call/ virtual visit): 20 minutes  [place], [place] [phone]

## 2019-05-06 ENCOUNTER — Other Ambulatory Visit: Payer: 59

## 2019-05-06 ENCOUNTER — Other Ambulatory Visit: Payer: Self-pay

## 2019-05-06 DIAGNOSIS — E034 Atrophy of thyroid (acquired): Secondary | ICD-10-CM

## 2019-05-06 DIAGNOSIS — E785 Hyperlipidemia, unspecified: Secondary | ICD-10-CM

## 2019-05-07 LAB — CMP14+EGFR
ALT: 11 IU/L (ref 0–32)
AST: 16 IU/L (ref 0–40)
Albumin/Globulin Ratio: 1.9 (ref 1.2–2.2)
Albumin: 4.6 g/dL (ref 3.8–4.8)
Alkaline Phosphatase: 109 IU/L (ref 39–117)
BUN/Creatinine Ratio: 13 (ref 12–28)
BUN: 8 mg/dL (ref 8–27)
Bilirubin Total: 0.4 mg/dL (ref 0.0–1.2)
CO2: 21 mmol/L (ref 20–29)
Calcium: 9.8 mg/dL (ref 8.7–10.3)
Chloride: 103 mmol/L (ref 96–106)
Creatinine, Ser: 0.63 mg/dL (ref 0.57–1.00)
GFR calc Af Amer: 110 mL/min/{1.73_m2} (ref >59)
GFR calc non Af Amer: 95 mL/min/{1.73_m2} (ref >59)
Globulin, Total: 2.4 g/dL (ref 1.5–4.5)
Glucose: 87 mg/dL (ref 65–99)
Potassium: 4.9 mmol/L (ref 3.5–5.2)
Sodium: 140 mmol/L (ref 134–144)
Total Protein: 7 g/dL (ref 6.0–8.5)

## 2019-05-07 LAB — LIPID PANEL
Chol/HDL Ratio: 5.4 ratio — ABNORMAL HIGH (ref 0.0–4.4)
Cholesterol, Total: 225 mg/dL — ABNORMAL HIGH (ref 100–199)
HDL: 42 mg/dL (ref >39)
LDL Chol Calc (NIH): 144 mg/dL — ABNORMAL HIGH (ref 0–99)
Triglycerides: 214 mg/dL — ABNORMAL HIGH (ref 0–149)
VLDL Cholesterol Cal: 39 mg/dL (ref 5–40)

## 2019-05-07 LAB — THYROID PANEL WITH TSH
Free Thyroxine Index: 2.6 (ref 1.2–4.9)
T3 Uptake Ratio: 27 % (ref 24–39)
T4, Total: 9.5 ug/dL (ref 4.5–12.0)
TSH: 3.81 u[IU]/mL (ref 0.450–4.500)

## 2019-05-08 ENCOUNTER — Telehealth: Payer: Self-pay | Admitting: Family Medicine

## 2019-05-08 NOTE — Telephone Encounter (Signed)
Pt aware of lab results, she has not been on lipitor, but will start

## 2019-11-18 DIAGNOSIS — R69 Illness, unspecified: Secondary | ICD-10-CM | POA: Diagnosis not present

## 2020-01-07 ENCOUNTER — Other Ambulatory Visit: Payer: Self-pay | Admitting: Family Medicine

## 2020-01-07 ENCOUNTER — Ambulatory Visit: Payer: 59

## 2020-01-07 ENCOUNTER — Telehealth: Payer: Self-pay

## 2020-01-07 MED ORDER — LEVOTHYROXINE SODIUM 100 MCG PO TABS: 100.0000 ug | ORAL_TABLET | Freq: Every day | ORAL | 0 refills | Status: DC

## 2020-01-07 NOTE — Telephone Encounter (Signed)
  Prescription Request  01/07/2020  What is the name of the medication or equipment? Levothyroxine 100 MCG Patient has appt 1-5 and is out of her thyroid medication  Have you contacted your pharmacy to request a refill? (if applicable) Yes  Which pharmacy would you like this sent to? [facility]   Patient notified that their request is being sent to the clinical staff for review and that they should receive a response within 2 business days.

## 2020-01-07 NOTE — Telephone Encounter (Signed)
Refill sent to [name] per patients reqeust. Patient notified and verbalized understanding

## 2020-01-21 ENCOUNTER — Ambulatory Visit (INDEPENDENT_AMBULATORY_CARE_PROVIDER_SITE_OTHER): Payer: Medicare HMO | Admitting: Family Medicine

## 2020-01-21 ENCOUNTER — Other Ambulatory Visit: Payer: Self-pay

## 2020-01-21 VITALS — BP 128/73 | HR 80 | Temp 97.3°F | Ht 64.0 in | Wt 174.0 lb

## 2020-01-21 DIAGNOSIS — F411 Generalized anxiety disorder: Secondary | ICD-10-CM

## 2020-01-21 DIAGNOSIS — E559 Vitamin D deficiency, unspecified: Secondary | ICD-10-CM

## 2020-01-21 DIAGNOSIS — K219 Gastro-esophageal reflux disease without esophagitis: Secondary | ICD-10-CM

## 2020-01-21 DIAGNOSIS — R69 Illness, unspecified: Secondary | ICD-10-CM | POA: Diagnosis not present

## 2020-01-21 DIAGNOSIS — B0089 Other herpesviral infection: Secondary | ICD-10-CM | POA: Diagnosis not present

## 2020-01-21 DIAGNOSIS — E785 Hyperlipidemia, unspecified: Secondary | ICD-10-CM | POA: Diagnosis not present

## 2020-01-21 DIAGNOSIS — F331 Major depressive disorder, recurrent, moderate: Secondary | ICD-10-CM

## 2020-01-21 DIAGNOSIS — E034 Atrophy of thyroid (acquired): Secondary | ICD-10-CM

## 2020-01-21 MED ORDER — VALACYCLOVIR HCL 1 G PO TABS
ORAL_TABLET | ORAL | 0 refills | Status: DC
Start: 2020-01-21 — End: 2020-12-20

## 2020-01-21 MED ORDER — FLUOXETINE HCL 40 MG PO CAPS
40.0000 mg | ORAL_CAPSULE | Freq: Every day | ORAL | 3 refills | Status: DC
Start: 2020-01-21 — End: 2021-03-14

## 2020-01-21 MED ORDER — OMEPRAZOLE 40 MG PO CPDR
40.0000 mg | DELAYED_RELEASE_CAPSULE | Freq: Every day | ORAL | 3 refills | Status: DC
Start: 2020-01-21 — End: 2021-03-14

## 2020-01-21 MED ORDER — LEVOTHYROXINE SODIUM 100 MCG PO TABS: 100.0000 ug | ORAL_TABLET | Freq: Every day | ORAL | 0 refills | Status: DC

## 2020-01-21 MED ORDER — ATORVASTATIN CALCIUM 20 MG PO TABS: 20.0000 mg | ORAL_TABLET | Freq: Every day | ORAL | 3 refills | Status: DC

## 2020-01-21 MED ORDER — BUSPIRONE HCL 30 MG PO TABS
15.0000 mg | ORAL_TABLET | Freq: Two times a day (BID) | ORAL | 3 refills | Status: DC
Start: 2020-01-21 — End: 2021-03-14

## 2020-01-21 NOTE — Patient Instructions (Addendum)
Fasting labs are ordered.  Schedule Mammogram at checkout  See me in next month or so for Medicare exam.

## 2020-01-21 NOTE — Progress Notes (Signed)
Subjective: CC: Follow-up hyperlipidemia, hypothyroidism and anxiety PCP: Janora Norlander, DO Heather [name] is a 65 y.o. female presenting to clinic today for:  1.  Hyperlipidemia/tobacco use disorder Patient is compliant with Lipitor 20 mg daily.  No chest pain, shortness of breath.  She continues to smoke.  Denies any unplanned weight loss, difficulty swallowing, change in activity tolerance  2.  Hypothyroidism Patient is compliant with her Synthroid 100 mcg daily.  She denies any tremor, heart palpitations, change in bowel habit or energy  3.  Anxiety disorder Patient continues to use BuSpar and Prozac as directed.  Reports stability of symptoms.  Needs refills  4.  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  She certainly continues to have less than 5 flares per year and still has medication left over from last refill but would like to get a renewal on this to have on file.   ROS: Per HPI  No Known Allergies Past Medical History:  Diagnosis Date  . Anxiety   . Arthritis   . Cataract   . Depression   . Fibromyalgia   . GERD (gastroesophageal reflux disease)   . Hyperlipidemia    no meds currently  . Hypothyroidism   . PONV (postoperative nausea and vomiting)   . Thyroid disease    hypo    Current Outpatient Medications:  .  atorvastatin (LIPITOR) 20 MG tablet, Take 1 tablet (20 mg total) by mouth daily., Disp: 90 tablet, Rfl: 3 .  busPIRone (BUSPAR) 30 MG tablet, Take 0.5 tablets (15 mg total) by mouth 2 (two) times daily., Disp: 90 tablet, Rfl: 3 .  FLUoxetine (PROZAC) 40 MG capsule, Take 1 capsule (40 mg total) by mouth daily., Disp: 90 capsule, Rfl: 3 .  levothyroxine (SYNTHROID) 100 MCG tablet, Take 1 tablet (100 mcg total) by mouth daily before breakfast. Euthyrox, Disp: 90 tablet, Rfl: 0 .  omeprazole (PRILOSEC) 40 MG capsule, Take 1 capsule (40 mg total) by mouth daily., Disp: 90 capsule, Rfl: 3 .  valACYclovir (VALTREX) 1000 MG tablet, Take 2 tablets 2 times a day for 1  day at fever blister onset, Disp: 20 tablet, Rfl: 0 Social History   Socioeconomic History  . Marital status: Married    Spouse name: Not on file  . Number of children: Not on file  . Years of education: Not on file  . Highest education level: Not on file  Occupational History  . Not on file  Tobacco Use  . Smoking status: Current Every Day Smoker    Packs/day: 1.00    Years: 25.00    Pack years: 25.00    Types: Cigarettes  . Smokeless tobacco: Never Used  Vaping Use  . Vaping Use: Never used  Substance and Sexual Activity  . Alcohol use: No  . Drug use: No  . Sexual activity: Yes    Birth control/protection: Surgical  Other Topics Concern  . Not on file  Social History Narrative  . Not on file   Social Determinants of Health   Financial Resource Strain: Not on file  Food Insecurity: Not on file  Transportation Needs: Not on file  Physical Activity: Not on file  Stress: Not on file  Social Connections: Not on file  Intimate Partner Violence: Not on file   Family History  Problem Relation Age of Onset  . Colon cancer Mother 10  . Rectal cancer Mother   . Colon cancer Other 60  . Heart attack Father 58  . Diabetes Sister   .  Leukemia Sister   . Diabetes Brother   . Heart attack Brother 54  . Hyperlipidemia Brother   . Diabetes Sister   . Heart disease Sister   . Diabetes Sister   . Thyroid disease Sister   . Diabetes Brother   . Mental illness Brother   . Graves' disease Brother   . Diabetes Brother   . Diabetes Brother   . CAD Brother   . Healthy Son   . Esophageal cancer Neg Hx   . Stomach cancer Neg Hx     Objective: Office vital signs reviewed. BP 128/73   Pulse 80   Temp (!) 97.3 F (36.3 C) (Temporal)   Ht 5' 4"  (1.626 m)   Wt 174 lb (78.9 kg)   SpO2 97%   BMI 29.87 kg/m   Physical Examination:  General: Awake, alert, well nourished, No acute distress HEENT: Normal; no exophthalmos.  No goiter.  Oropharynx is clear without masses.   No sublingual masses.  Several dental caps noted. Cardio: regular rate and rhythm, S1S2 heard, no murmurs appreciated Pulm: clear to auscultation bilaterally, no wheezes, rhonchi or rales; normal work of breathing on room air Extremities: warm, well perfused, No edema, cyanosis or clubbing; +2 pulses bilaterally MSK: normal gait and station Skin: dry; intact; no rashes or lesions; normal temperature Neuro no tremor Psych: Mood stable, speech normal  Depression screen [facility] 2/9 01/21/2020 09/03/2018 11/19/2017  Decreased Interest 0 0 0  Down, Depressed, Hopeless 0 0 0  PHQ - 2 Score 0 0 0  Altered sleeping 0 0 -  Tired, decreased energy 0 0 -  Change in appetite 0 0 -  Feeling bad or failure about yourself  0 0 -  Trouble concentrating 0 0 -  Moving slowly or fidgety/restless 0 0 -  Suicidal thoughts 0 0 -  PHQ-9 Score 0 0 -     Assessment/ Plan: 65 y.o. female   Hypothyroidism due to acquired atrophy of thyroid - Plan: Thyroid Panel With TSH, CMP14+EGFR, Lipid panel, Lipid panel, CMP14+EGFR, Thyroid Panel With TSH  GAD (generalized anxiety disorder) - Plan: busPIRone (BUSPAR) 30 MG tablet  Moderate episode of recurrent major depressive disorder (HCC) - Plan: FLUoxetine (PROZAC) 40 MG capsule  Vitamin D deficiency - Plan: VITAMIN D 25 Hydroxy (Vit-D Deficiency, Fractures), VITAMIN D 25 Hydroxy (Vit-D Deficiency, Fractures)  Hyperlipidemia with target LDL less than 100 - Plan: atorvastatin (LIPITOR) 20 MG tablet  Gastroesophageal reflux disease without esophagitis - Plan: omeprazole (PRILOSEC) 40 MG capsule  Recurrent oral herpes simplex infection - Plan: valACYclovir (VALTREX) 1000 MG tablet  She is asymptomatic from a thyroid standpoint.  Anxiety seems to be well controlled.  Continue current regimen  She will come in for fasting labs and will plan for her welcome to Medicare visit soon.  She will schedule her mammogram.  Would like to hold off on pneumococcal  vaccination.  Continue statin.  Plan for fasting labs as above  HSV has been stable.  Valtrex renewed  No orders of the defined types were placed in this encounter.  No orders of the defined types were placed in this encounter.    Janora Norlander, DO [place] [phone]

## 2020-01-22 ENCOUNTER — Other Ambulatory Visit: Payer: Self-pay

## 2020-01-22 DIAGNOSIS — E034 Atrophy of thyroid (acquired): Secondary | ICD-10-CM

## 2020-01-22 LAB — THYROID PANEL WITH TSH
Free Thyroxine Index: 2.5 (ref 1.2–4.9)
T3 Uptake Ratio: 27 % (ref 24–39)
T4, Total: 9.1 ug/dL (ref 4.5–12.0)
TSH: 6.05 u[IU]/mL — ABNORMAL HIGH (ref 0.450–4.500)

## 2020-01-22 LAB — CMP14+EGFR
ALT: 9 IU/L (ref 0–32)
AST: 13 IU/L (ref 0–40)
Albumin/Globulin Ratio: 1.9 (ref 1.2–2.2)
Albumin: 4.6 g/dL (ref 3.8–4.8)
Alkaline Phosphatase: 99 IU/L (ref 44–121)
BUN/Creatinine Ratio: 20 (ref 12–28)
BUN: 14 mg/dL (ref 8–27)
Bilirubin Total: 0.3 mg/dL (ref 0.0–1.2)
CO2: 23 mmol/L (ref 20–29)
Calcium: 9.9 mg/dL (ref 8.7–10.3)
Chloride: 101 mmol/L (ref 96–106)
Creatinine, Ser: 0.7 mg/dL (ref 0.57–1.00)
GFR calc Af Amer: 105 mL/min/{1.73_m2} (ref >59)
GFR calc non Af Amer: 91 mL/min/{1.73_m2} (ref >59)
Globulin, Total: 2.4 g/dL (ref 1.5–4.5)
Glucose: 81 mg/dL (ref 65–99)
Potassium: 4.3 mmol/L (ref 3.5–5.2)
Sodium: 139 mmol/L (ref 134–144)
Total Protein: 7 g/dL (ref 6.0–8.5)

## 2020-01-22 LAB — LIPID PANEL
Chol/HDL Ratio: 4.3 ratio (ref 0.0–4.4)
Cholesterol, Total: 166 mg/dL (ref 100–199)
HDL: 39 mg/dL — ABNORMAL LOW (ref >39)
LDL Chol Calc (NIH): 83 mg/dL (ref 0–99)
Triglycerides: 265 mg/dL — ABNORMAL HIGH (ref 0–149)
VLDL Cholesterol Cal: 44 mg/dL — ABNORMAL HIGH (ref 5–40)

## 2020-01-22 LAB — VITAMIN D 25 HYDROXY (VIT D DEFICIENCY, FRACTURES): Vit D, 25-Hydroxy: 20.4 ng/mL — ABNORMAL LOW (ref 30.0–100.0)

## 2020-01-22 NOTE — Progress Notes (Signed)
Place order thyroid panel per lab result note

## 2020-02-04 ENCOUNTER — Other Ambulatory Visit: Payer: Self-pay | Admitting: Family Medicine

## 2020-02-04 DIAGNOSIS — Z1231 Encounter for screening mammogram for malignant neoplasm of breast: Secondary | ICD-10-CM

## 2020-03-01 ENCOUNTER — Encounter: Payer: Medicare HMO | Admitting: Family Medicine

## 2020-03-10 ENCOUNTER — Ambulatory Visit
Admission: RE | Admit: 2020-03-10 | Discharge: 2020-03-10 | Disposition: A | Discharge status: Home / Self Care | Payer: Medicare HMO | Source: Ambulatory Visit | Attending: Family Medicine | Admitting: Family Medicine

## 2020-03-10 ENCOUNTER — Other Ambulatory Visit: Payer: Self-pay

## 2020-03-10 ENCOUNTER — Other Ambulatory Visit: Payer: Self-pay | Admitting: Family Medicine

## 2020-03-10 ENCOUNTER — Encounter: Payer: Self-pay | Admitting: Family Medicine

## 2020-03-10 ENCOUNTER — Ambulatory Visit (INDEPENDENT_AMBULATORY_CARE_PROVIDER_SITE_OTHER): Payer: Medicare HMO

## 2020-03-10 ENCOUNTER — Ambulatory Visit (INDEPENDENT_AMBULATORY_CARE_PROVIDER_SITE_OTHER): Payer: Medicare HMO | Admitting: Family Medicine

## 2020-03-10 VITALS — BP 131/72 | HR 84 | Temp 98.5°F | Wt 175.0 lb

## 2020-03-10 DIAGNOSIS — Z0001 Encounter for general adult medical examination with abnormal findings: Secondary | ICD-10-CM | POA: Diagnosis not present

## 2020-03-10 DIAGNOSIS — M25561 Pain in right knee: Secondary | ICD-10-CM | POA: Diagnosis not present

## 2020-03-10 DIAGNOSIS — Z1231 Encounter for screening mammogram for malignant neoplasm of breast: Secondary | ICD-10-CM | POA: Diagnosis not present

## 2020-03-10 DIAGNOSIS — Z780 Asymptomatic menopausal state: Secondary | ICD-10-CM

## 2020-03-10 DIAGNOSIS — R7989 Other specified abnormal findings of blood chemistry: Secondary | ICD-10-CM | POA: Diagnosis not present

## 2020-03-10 DIAGNOSIS — Z0000 Encounter for general adult medical examination without abnormal findings: Secondary | ICD-10-CM

## 2020-03-10 DIAGNOSIS — G8929 Other chronic pain: Secondary | ICD-10-CM | POA: Diagnosis not present

## 2020-03-10 DIAGNOSIS — E039 Hypothyroidism, unspecified: Secondary | ICD-10-CM | POA: Diagnosis not present

## 2020-03-10 MED ORDER — DICLOFENAC SODIUM 1 % EX GEL: 4.0000 g | Freq: Four times a day (QID) | CUTANEOUS | 2 refills | Status: DC | PRN

## 2020-03-10 NOTE — Patient Instructions (Signed)
Thank you for coming in today for your Annual Medicare Wellness Visit.  Things that we discussed today are included in this packet.  Create and/or bring a copy of your Living Will/ Advanced Directive into the office so that we may respect your wishes should an emergency occur.  Get the recommended life-saving vaccines we discussed today.  Get your mammogram/ colonoscopy/ DEXA scans as directed by your provider.  Make sure that your medications are organized and safely stored.  Remember to always ask for help if you forget when/ how to take your medications.  Make healthy food choices (Rich in fruits/ veggies/ lean meats and low in salt, sugar and fat)  Do something that you enjoy for at least 30 minutes every day to stay active (walking, gardening, swimming, etc). This will help you lower your risk of falls/ broken bones.  Be social, do puzzles/ crosswords.  These things help the mind stay young and lower your risk of developing dementia.  Make sure that your home is safe by checking your smoke detectors regularly and doing the things outlined below to lower your risk of falls.  Fall Prevention in the Home Falls can cause injuries and can affect people from all age groups. There are many simple things that you can do to make your home safe and to help prevent falls. What can I do on the outside of my home? Regularly repair the edges of walkways and driveways and fix any cracks. Remove high doorway thresholds. Trim any shrubbery on the main path into your home. Use bright outdoor lighting. Clear walkways of debris and clutter, including tools and rocks. Regularly check that handrails are securely fastened and in good repair. Both sides of any steps should have handrails. Install guardrails along the edges of any raised decks or porches. Have leaves, snow, and ice cleared regularly. Use sand or salt on walkways during winter months. In the garage, clean up any spills right away, including  grease or oil spills. What can I do in the bathroom? Use night lights. Install grab bars by the toilet and in the tub and shower. Do not use towel bars as grab bars. Use non-skid mats or decals on the floor of the tub or shower. If you need to sit down while you are in the shower, use a plastic, non-slip stool. Keep the floor dry. Immediately clean up any water that spills on the floor. Remove soap buildup in the tub or shower on a regular basis. Attach bath mats securely with double-sided non-slip rug tape. Remove throw rugs and other tripping hazards from the floor. What can I do in the bedroom? Use night lights. Make sure that a bedside [place] is easy to reach. Do not use oversized bedding that drapes onto the floor. Have a firm chair that has side arms to use for getting dressed. Remove throw rugs and other tripping hazards from the floor. What can I do in the kitchen? Clean up any spills right away. Avoid walking on wet floors. Place frequently used items in easy-to-reach places. If you need to reach for something above you, use a sturdy step stool that has a grab bar. Keep electrical cables out of the way. Do not use floor polish or wax that makes floors slippery. If you have to use wax, make sure that it is non-skid floor wax. Remove throw rugs and other tripping hazards from the floor. What can I do in the stairways? Do not leave any items on the stairs.   Make sure that there are handrails on both sides of the stairs. Fix handrails that are broken or loose. Make sure that handrails are as long as the stairways. Check any carpeting to make sure that it is firmly attached to the stairs. Fix any carpet that is loose or worn. Avoid having throw rugs at the top or bottom of stairways, or secure the rugs with carpet tape to prevent them from moving. Make sure that you have a Seiler switch at the top of the stairs and the bottom of the stairs. If you do not have them, have them  installed. What are some other fall prevention tips? Wear closed-toe shoes that fit well and support your feet. Wear shoes that have rubber soles or low heels. When you use a stepladder, make sure that it is completely opened and that the sides are firmly locked. Have someone hold the ladder while you are using it. Do not climb a closed stepladder. Add color or contrast paint or tape to grab bars and handrails in your home. Place contrasting color strips on the first and last steps. Use mobility aids as needed, such as canes, walkers, scooters, and crutches. Turn on lights if it is dark. Replace any Strohecker bulbs that burn out. Set up furniture so that there are clear paths. Keep the furniture in the same spot. Fix any uneven floor surfaces. Choose a carpet design that does not hide the edge of steps of a stairway. Be aware of any and all pets. Review your medicines with your healthcare provider. Some medicines can cause dizziness or changes in blood pressure, which increase your risk of falling. Talk with your health care provider about other ways that you can decrease your risk of falls. This may include working with a physical therapist or trainer to improve your strength, balance, and endurance. This information is not intended to replace advice given to you by your health care provider. Make sure you discuss any questions you have with your health care provider. Document Released: 12/23/2001 Document Revised: 06/01/2015 Document Reviewed: 02/06/2014 Elsevier Interactive Patient Education  2017 [employer]  

## 2020-03-10 NOTE — Progress Notes (Signed)
Subjective:    Heather [name] is a 65 y.o. female who presents for a Welcome to Medicare exam.   Elevated TSH: Noted on January lab draw.  Compliant with meds.  Admits to intermittent mild fatigue. NO change in BMs.  Right knee pain:  History of meniscal tear now s/p surgical repair.  Not currently treating with any meds.  Hasn't used voltaren gel in past but is interested.   Review of Systems Positive for decreased energy, right knee pain. Otherwise, Negative ROI Cardiac Risk Factors include: advanced age (>65men, >50 women);dyslipidemia;obesity (BMI >30kg/m2);smoking/ tobacco exposure      Objective:    Today's Vitals   03/10/20 1218  BP: 131/72  Pulse: 84  Temp: 98.5 F (36.9 C)  SpO2: 97%  Weight: 175 lb (79.4 kg)  Body mass index is 30.04 kg/m.  Medications Outpatient Encounter Medications as of 03/10/2020  Medication Sig  . atorvastatin (LIPITOR) 20 MG tablet Take 1 tablet (20 mg total) by mouth daily.  . busPIRone (BUSPAR) 30 MG tablet Take 0.5 tablets (15 mg total) by mouth 2 (two) times daily.  . diclofenac Sodium (VOLTAREN) 1 % GEL Apply 4 g topically 4 (four) times daily as needed (knee pain).  Marland Kitchen FLUoxetine (PROZAC) 40 MG capsule Take 1 capsule (40 mg total) by mouth daily.  Marland Kitchen levothyroxine (SYNTHROID) 100 MCG tablet Take 1 tablet (100 mcg total) by mouth daily before breakfast. Euthyrox  . omeprazole (PRILOSEC) 40 MG capsule Take 1 capsule (40 mg total) by mouth daily.  . valACYclovir (VALTREX) 1000 MG tablet Take 2 tablets 2 times a day for 1 day at fever blister onset   No facility-administered encounter medications on file as of 03/10/2020.     History: Past Medical History:  Diagnosis Date  . Anxiety   . Arthritis   . Cataract   . Depression   . Fibromyalgia   . GERD (gastroesophageal reflux disease)   . Hyperlipidemia    no meds currently  . Hypothyroidism   . PONV (postoperative nausea and vomiting)   . Thyroid disease    hypo   Past  Surgical History:  Procedure Laterality Date  . CATARACT EXTRACTION Left   . CATARACT EXTRACTION W/PHACO Right 12/29/2013   Procedure: CATARACT EXTRACTION PHACO AND INTRAOCULAR LENS PLACEMENT (IOC);  Surgeon: Tonny Branch, MD;  Location: [facility];  Service: Ophthalmology;  Laterality: Right;  CDE:4.84  . COLONOSCOPY    . EYE SURGERY     cleaned debris from behind eye.  Marland Kitchen FOOT FRACTURE SURGERY Left   . KNEE SURGERY Right   . TUBAL LIGATION    . tubal ligation reversal    . VAGINAL HYSTERECTOMY      Family History  Problem Relation Age of Onset  . Colon cancer Mother 73  . Rectal cancer Mother   . Colon cancer Other 27  . Heart attack Father 37  . Diabetes Sister   . Leukemia Sister   . Diabetes Brother   . Heart attack Brother 61  . Hyperlipidemia Brother   . Diabetes Sister   . Heart disease Sister   . Diabetes Sister   . Thyroid disease Sister   . Diabetes Brother   . Mental illness Brother   . Graves' disease Brother   . Diabetes Brother   . Diabetes Brother   . CAD Brother   . Healthy Son   . Esophageal cancer Neg Hx   . Stomach cancer Neg Hx    Social History  Occupational History  . Not on file  Tobacco Use  . Smoking status: Current Every Day Smoker    Packs/day: 1.00    Years: 25.00    Pack years: 25.00    Types: Cigarettes  . Smokeless tobacco: Never Used  Vaping Use  . Vaping Use: Never used  Substance and Sexual Activity  . Alcohol use: No  . Drug use: No  . Sexual activity: Yes    Birth control/protection: Surgical    Tobacco Counseling Ready to quit: No Counseling given: Yes   Immunizations and Health Maintenance Immunization History  Administered Date(s) Administered  . Influenza,inj,Quad PF,6+ Mos 10/26/2014, 11/19/2017, 10/01/2018  . Influenza-Unspecified 11/18/2019  . Moderna Sars-Covid-2 Vaccination 04/17/2019, 05/16/2019, 11/18/2019  . Td 09/15/2010  . Zoster 05/12/2015  . Zoster Recombinat (Shingrix) 05/11/2016, 08/31/2016    Health Maintenance Due  Topic Date Due  . MAMMOGRAM  01/02/2019  . COLON CANCER SCREENING ANNUAL FOBT  07/11/2019  . DEXA SCAN  Never done    Activities of Daily Living In your present state of health, do you have any difficulty performing the following activities: 03/10/2020  Hearing? N  Vision? N  Difficulty concentrating or making decisions? N  Walking or climbing stairs? N  Dressing or bathing? N  Doing errands, shopping? N  Preparing Food and eating ? N  Using the Toilet? N  In the past six months, have you accidently leaked urine? N  Do you have problems with loss of bowel control? N  Managing your Medications? N  Managing your Finances? N  Housekeeping or managing your Housekeeping? N  Some recent data might be hidden    Physical Exam   Gen: Well appearing. Alert. HEENT: no exophthalmos. No goiter Cardio: Regular rate and rhythm. No murmurs. S1-S2 heard Pulm: Clear to auscultation bilaterally. Normal work of breathing on room air MSK: Ambulating independently. Normal tone. Neuro: no tremor  Advanced Directives: Does Patient Have a Medical Advance Directive?: No Would patient like information on creating a medical advance directive?: Yes (Inpatient - patient defers creating a medical advance directive at this time - Information given)    Assessment:    This is a routine wellness examination for this patient .   Vision/Hearing screen No exam data present  Dietary issues and exercise activities discussed:  Current Exercise Habits: The patient does not participate in regular exercise at present, Exercise limited by: orthopedic condition(s)  Goals    . Exercise 3x per week (30 min per time)      Depression Screen PHQ 2/9 Scores 03/10/2020 01/21/2020 09/03/2018 11/19/2017  PHQ - 2 Score 0 0 0 0  PHQ- 9 Score 1 0 0 -     Fall Risk Fall Risk  03/10/2020  Falls in the past year? 1  Number falls in past yr: 0  Injury with Fall? 1  Risk for fall due to :  Impaired balance/gait  Follow up Education provided    Cognitive Function: MMSE - Mini Mental State Exam 03/10/2020  Orientation to time 5  Orientation to Place 3  Registration 3  Attention/ Calculation 5  Recall 3  Language- name 2 objects 2  Language- repeat 1  Language- follow 3 step command 3  Language- read & follow direction 1  Write a sentence 1  Copy design 1  Total score 28        Patient Care Team: Janora Norlander, DO as PCP - General (Family Medicine)     Plan:  Welcome to Medicare preventive visit  Asymptomatic postmenopausal estrogen deficiency - Plan: DG [facility] DEXA  Elevated TSH - Plan: Thyroid Panel With TSH  Chronic pain of right knee - Plan: diclofenac Sodium (VOLTAREN) 1 % GEL  Declined tetanus.  I have personally reviewed and noted the following in the patient's chart:   . Medical and social history . Use of alcohol, tobacco or illicit drugs  . Current medications and supplements . Functional ability and status . Nutritional status . Physical activity . Advanced directives . List of other physicians . Hospitalizations, surgeries, and ER visits in previous 12 months . Vitals . Screenings to include cognitive, depression, and falls . Referrals and appointments  In addition, I have reviewed and discussed with patient certain preventive protocols, quality metrics, and best practice recommendations. A written personalized care plan for preventive services as well as general preventive health recommendations were provided to patient.     Ronnie Doss, DO 03/10/2020

## 2020-03-11 LAB — THYROID PANEL WITH TSH
Free Thyroxine Index: 2.3 (ref 1.2–4.9)
T3 Uptake Ratio: 26 % (ref 24–39)
T4, Total: 8.7 ug/dL (ref 4.5–12.0)
TSH: 5.47 u[IU]/mL — ABNORMAL HIGH (ref 0.450–4.500)

## 2020-03-12 ENCOUNTER — Other Ambulatory Visit: Payer: Self-pay | Admitting: *Deleted

## 2020-03-12 ENCOUNTER — Other Ambulatory Visit: Payer: Self-pay | Admitting: Family Medicine

## 2020-03-12 ENCOUNTER — Telehealth: Payer: Self-pay | Admitting: *Deleted

## 2020-03-12 DIAGNOSIS — R7989 Other specified abnormal findings of blood chemistry: Secondary | ICD-10-CM

## 2020-03-12 DIAGNOSIS — M170 Bilateral primary osteoarthritis of knee: Secondary | ICD-10-CM

## 2020-03-12 DIAGNOSIS — Z780 Asymptomatic menopausal state: Secondary | ICD-10-CM | POA: Diagnosis not present

## 2020-03-12 DIAGNOSIS — M8589 Other specified disorders of bone density and structure, multiple sites: Secondary | ICD-10-CM | POA: Diagnosis not present

## 2020-03-12 MED ORDER — LEVOTHYROXINE SODIUM 112 MCG PO TABS: 112.0000 ug | ORAL_TABLET | Freq: Every day | ORAL | 1 refills | Status: DC

## 2020-03-12 MED ORDER — DICLOFENAC SODIUM 1 % EX GEL
4.0000 g | Freq: Four times a day (QID) | CUTANEOUS | 0 refills | Status: DC
Start: 2020-03-12 — End: 2023-08-13

## 2020-03-12 NOTE — Telephone Encounter (Signed)
Resubmitted with OA dx

## 2020-03-12 NOTE — Telephone Encounter (Signed)
Key: [id number] - PA Case ID: [id number] - Rx #: [id number] Need help? Call us at [phone] PA Status for Patients  Status Sent to [facility] Diclofenac Sodium 1% gel

## 2020-03-12 NOTE — Telephone Encounter (Signed)
Diclofenac denied patient must have DX of osteoarthritis.

## 2020-03-12 NOTE — Telephone Encounter (Signed)
Will have to appeal at this point - note on PA states:  previous Prior Authorization Request was Denied Please contact the plan at [phone] for appeal  Will do this on Monday

## 2020-03-15 NOTE — Telephone Encounter (Signed)
[phone] called for an appeal for diclofenac gel  Sent for review - will get a faxed outcome.

## 2020-03-22 NOTE — Telephone Encounter (Signed)
PA approved 01/17/20-01/15/21.   Pharmacy and patient aware.

## 2020-09-08 ENCOUNTER — Ambulatory Visit (INDEPENDENT_AMBULATORY_CARE_PROVIDER_SITE_OTHER): Payer: Medicare HMO | Admitting: Family Medicine

## 2020-09-08 ENCOUNTER — Encounter: Payer: Self-pay | Admitting: Family Medicine

## 2020-09-08 ENCOUNTER — Other Ambulatory Visit: Payer: Self-pay

## 2020-09-08 VITALS — BP 139/85 | HR 82 | Temp 97.4°F | Ht 64.0 in | Wt 178.2 lb

## 2020-09-08 DIAGNOSIS — E559 Vitamin D deficiency, unspecified: Secondary | ICD-10-CM | POA: Diagnosis not present

## 2020-09-08 DIAGNOSIS — F411 Generalized anxiety disorder: Secondary | ICD-10-CM

## 2020-09-08 DIAGNOSIS — E785 Hyperlipidemia, unspecified: Secondary | ICD-10-CM | POA: Diagnosis not present

## 2020-09-08 DIAGNOSIS — E034 Atrophy of thyroid (acquired): Secondary | ICD-10-CM | POA: Diagnosis not present

## 2020-09-08 DIAGNOSIS — F331 Major depressive disorder, recurrent, moderate: Secondary | ICD-10-CM | POA: Diagnosis not present

## 2020-09-08 DIAGNOSIS — R69 Illness, unspecified: Secondary | ICD-10-CM | POA: Diagnosis not present

## 2020-09-08 NOTE — Progress Notes (Signed)
Subjective: CC: Hypothyroidism PCP: Janora Norlander, DO Heather [name] is a 65 y.o. female presenting to clinic today for:  1.  Hypothyroidism Synthroid increased to 175mg last visit.  She reports feeling good.  Energy has been good.  No change in bowel habits, voice.  No reports of tremor.  2.  Hyperlipidemia Compliant with Lipitor 20 mg daily.  No chest pain, shortness of breath  3.  Anxiety disorder/ Depression Patient reports good control symptoms with Prozac 40 mg daily, BuSpar 15 mg twice daily.  ROS: Per HPI  No Known Allergies Past Medical History:  Diagnosis Date   Anxiety    Arthritis    Cataract    Depression    Fibromyalgia    GERD (gastroesophageal reflux disease)    Hyperlipidemia    no meds currently   Hypothyroidism    PONV (postoperative nausea and vomiting)    Thyroid disease    hypo    Current Outpatient Medications:    atorvastatin (LIPITOR) 20 MG tablet, Take 1 tablet (20 mg total) by mouth daily., Disp: 90 tablet, Rfl: 3   busPIRone (BUSPAR) 30 MG tablet, Take 0.5 tablets (15 mg total) by mouth 2 (two) times daily., Disp: 90 tablet, Rfl: 3   diclofenac Sodium (VOLTAREN) 1 % GEL, Apply 4 g topically 4 (four) times daily. For knee pain/ arthritis, Disp: 400 g, Rfl: 0   FLUoxetine (PROZAC) 40 MG capsule, Take 1 capsule (40 mg total) by mouth daily., Disp: 90 capsule, Rfl: 3   levothyroxine (SYNTHROID) 112 MCG tablet, Take 1 tablet (112 mcg total) by mouth daily before breakfast. Euthyrox, Disp: 90 tablet, Rfl: 1   omeprazole (PRILOSEC) 40 MG capsule, Take 1 capsule (40 mg total) by mouth daily., Disp: 90 capsule, Rfl: 3   valACYclovir (VALTREX) 1000 MG tablet, Take 2 tablets 2 times a day for 1 day at fever blister onset, Disp: 20 tablet, Rfl: 0 Social History   Socioeconomic History   Marital status: Married    Spouse name: Not on file   Number of children: Not on file   Years of education: Not on file   Highest education level: Not on  file  Occupational History   Not on file  Tobacco Use   Smoking status: Every Day    Packs/day: 1.00    Years: 25.00    Pack years: 25.00    Types: Cigarettes   Smokeless tobacco: Never  Vaping Use   Vaping Use: Never used  Substance and Sexual Activity   Alcohol use: No   Drug use: No   Sexual activity: Yes    Birth control/protection: Surgical  Other Topics Concern   Not on file  Social History Narrative   Not on file   Social Determinants of Health   Financial Resource Strain: Not on file  Food Insecurity: Not on file  Transportation Needs: Not on file  Physical Activity: Not on file  Stress: Not on file  Social Connections: Not on file  Intimate Partner Violence: Not on file   Family History  Problem Relation Age of Onset   Colon cancer Mother 648  Rectal cancer Mother    Colon cancer Other 459  Heart attack Father 662  Diabetes Sister    Leukemia Sister    Diabetes Brother    Heart attack Brother 684  Hyperlipidemia Brother    Diabetes Sister    Heart disease Sister    Diabetes Sister    Thyroid  disease Sister    Diabetes Brother    Mental illness Brother    Graves' disease Brother    Diabetes Brother    Diabetes Brother    CAD Brother    Healthy Son    Esophageal cancer Neg Hx    Stomach cancer Neg Hx     Objective: Office vital signs reviewed. BP 139/85   Pulse 82   Temp (!) 97.4 F (36.3 C)   Ht 5' 4"  (1.626 m)   Wt 178 lb 3.2 oz (80.8 kg)   SpO2 98%   BMI 30.59 kg/m   Physical Examination:  General: Awake, alert, well nourished, No acute distress HEENT: Normal; sclera white.  No exophthalmos.  No goiter Cardio: regular rate and rhythm, S1S2 heard, no murmurs appreciated Pulm: clear to auscultation bilaterally, no wheezes, rhonchi or rales; normal work of breathing on room air Extremities: warm, well perfused, No edema, cyanosis or clubbing; +2 pulses bilaterally MSK: normal gait and station Skin: dry; intact; no rashes or lesions;  normal temperature Neuro: Patellar DTRs 2 out of 4 bilaterally.  No tremor  Depression screen [facility] 2/9 09/08/2020 03/10/2020 01/21/2020  Decreased Interest 0 0 0  Down, Depressed, Hopeless 0 0 0  PHQ - 2 Score 0 0 0  Altered sleeping - 1 0  Tired, decreased energy - 0 0  Change in appetite - 0 0  Feeling bad or failure about yourself  - 0 0  Trouble concentrating - 0 0  Moving slowly or fidgety/restless - 0 0  Suicidal thoughts - 0 0  PHQ-9 Score - 1 0  Difficult doing work/chores - Not difficult at all -   GAD 7 : Generalized Anxiety Score 09/08/2020 03/10/2020 09/03/2018 11/19/2017  Nervous, Anxious, on Edge 0 0 0 0  Control/stop worrying 0 1 1 0  Worry too much - different things 0 2 0 0  Trouble relaxing 0 0 1 0  Restless 0 0 1 0  Easily annoyed or irritable 0 0 0 0  Afraid - awful might happen 0 0 0 0  Total GAD 7 Score 0 3 3 0  Anxiety Difficulty Not difficult at all Not difficult at all Not difficult at all Not difficult at all   Assessment/ Plan: 65 y.o. female   Hypothyroidism due to acquired atrophy of thyroid - Plan: TSH, T4, Free  Hyperlipidemia with target LDL less than 100 - Plan: CMP14+EGFR, Lipid Panel  Moderate episode of recurrent major depressive disorder (HCC)  GAD (generalized anxiety disorder)  Vitamin D deficiency - Plan: VITAMIN D 25 Hydroxy (Vit-D Deficiency, Fractures)  Asymptomatic from a thyroid standpoint.  Check thyroid labs  Check fasting lipid panel.  Continue statin.  Depression and anxiety are well controlled.  Continue current regimen of Prozac and BuSpar  Check vitamin D level  Orders Placed This Encounter  Procedures   VITAMIN D 25 Hydroxy (Vit-D Deficiency, Fractures)   TSH   T4, Free   CMP14+EGFR   Lipid Panel   No orders of the defined types were placed in this encounter.    Janora Norlander, DO [place] [phone]

## 2020-09-08 NOTE — Patient Instructions (Signed)
You had labs performed today.  You will be contacted with the results of the labs once they are available, usually in the next 3 business days for routine lab work.  If you have an active my chart account, they will be released to your MyChart.  If you prefer to have these labs released to you via telephone, please let us know.     

## 2020-09-09 LAB — CMP14+EGFR
ALT: 8 IU/L (ref 0–32)
AST: 15 IU/L (ref 0–40)
Albumin/Globulin Ratio: 1.9 (ref 1.2–2.2)
Albumin: 4.5 g/dL (ref 3.8–4.8)
Alkaline Phosphatase: 93 IU/L (ref 44–121)
BUN/Creatinine Ratio: 15 (ref 12–28)
BUN: 9 mg/dL (ref 8–27)
Bilirubin Total: 0.5 mg/dL (ref 0.0–1.2)
CO2: 23 mmol/L (ref 20–29)
Calcium: 9.9 mg/dL (ref 8.7–10.3)
Chloride: 103 mmol/L (ref 96–106)
Creatinine, Ser: 0.61 mg/dL (ref 0.57–1.00)
Globulin, Total: 2.4 g/dL (ref 1.5–4.5)
Glucose: 87 mg/dL (ref 65–99)
Potassium: 4.5 mmol/L (ref 3.5–5.2)
Sodium: 139 mmol/L (ref 134–144)
Total Protein: 6.9 g/dL (ref 6.0–8.5)
eGFR: 99 mL/min/{1.73_m2} (ref >59)

## 2020-09-09 LAB — LIPID PANEL
Chol/HDL Ratio: 3.5 ratio (ref 0.0–4.4)
Cholesterol, Total: 151 mg/dL (ref 100–199)
HDL: 43 mg/dL (ref >39)
LDL Chol Calc (NIH): 81 mg/dL (ref 0–99)
Triglycerides: 157 mg/dL — ABNORMAL HIGH (ref 0–149)
VLDL Cholesterol Cal: 27 mg/dL (ref 5–40)

## 2020-09-09 LAB — T4, FREE: Free T4: 1.59 ng/dL (ref 0.82–1.77)

## 2020-09-09 LAB — TSH: TSH: 1.85 u[IU]/mL (ref 0.450–4.500)

## 2020-09-09 LAB — VITAMIN D 25 HYDROXY (VIT D DEFICIENCY, FRACTURES): Vit D, 25-Hydroxy: 31.4 ng/mL (ref 30.0–100.0)

## 2020-09-28 ENCOUNTER — Other Ambulatory Visit: Payer: Self-pay | Admitting: Family Medicine

## 2020-12-18 ENCOUNTER — Other Ambulatory Visit: Payer: Self-pay | Admitting: Family Medicine

## 2020-12-18 DIAGNOSIS — B0089 Other herpesviral infection: Secondary | ICD-10-CM

## 2021-02-09 DIAGNOSIS — H26491 Other secondary cataract, right eye: Secondary | ICD-10-CM | POA: Diagnosis not present

## 2021-03-11 ENCOUNTER — Ambulatory Visit (INDEPENDENT_AMBULATORY_CARE_PROVIDER_SITE_OTHER): Payer: Medicare HMO

## 2021-03-11 VITALS — Wt 178.0 lb

## 2021-03-11 DIAGNOSIS — Z0000 Encounter for general adult medical examination without abnormal findings: Secondary | ICD-10-CM

## 2021-03-11 NOTE — Patient Instructions (Signed)
[name] , Thank you for taking time to come for your Medicare Wellness Visit. I appreciate your ongoing commitment to your health goals. Please review the following plan we discussed and let me know if I can assist you in the future.   Screening recommendations/referrals: Colonoscopy: Done 07/11/2018 - repeat in 3 years *this year Mammogram: Done 03/10/2020 - Repeat annually *appointment 04/11/2021 @ 1pm Bone Density: Done 03/12/2020 - Repeat every 2 years  Recommended yearly ophthalmology/optometry visit for glaucoma screening and checkup Recommended yearly dental visit for hygiene and checkup  Vaccinations: Influenza vaccine: Done 10/23/2020 - Repeat annually Pneumococcal vaccine: Ask about once per lifetime [id number]-74 (other options, Prevnar-13 and Pneumovax-23) Tdap vaccine: Done 09/15/2010 - Repeat in 10 years *due at next visit Shingles vaccine: Done 05/11/2016 & 08/31/2016 Covid-19: Done 04/17/2019, 05/16/2019, & 11/18/2019 - contact your pharmacy for additional boosters  Advanced directives: Advance directive discussed with you today. Even though you declined this today, please call our office should you change your mind, and we can give you the proper paperwork for you to fill out.   Conditions/risks identified: Aim for 30 minutes of exercise or brisk walking, 6-8 glasses of water and 5 servings of fruits and vegetables each day.   If you wish to quit smoking, help is available. For free tobacco cessation program offerings call the [facility] at 3640538137 or Live Well Line at (743) 269-4245. You may also visit www.[place].com or email [email or web address]@[place] .com for more information on other programs.   You may also call 1-800-QUIT-NOW 431-042-9344) or visit www.VirusCrisis.dk or www.BecomeAnEx.org for additional resources on smoking cessation.    Next appointment: Follow up in one year for your annual wellness visit    Preventive Care 67 Years and Older,  Female Preventive care refers to lifestyle choices and visits with your health care provider that can promote health and wellness. What does preventive care include? A yearly physical exam. This is also called an annual well check. Dental exams once or twice a year. Routine eye exams. Ask your health care provider how often you should have your eyes checked. Personal lifestyle choices, including: Daily care of your teeth and gums. Regular physical activity. Eating a healthy diet. Avoiding tobacco and drug use. Limiting alcohol use. Practicing safe sex. Taking low-dose aspirin every day. Taking vitamin and mineral supplements as recommended by your health care provider. What happens during an annual well check? The services and screenings done by your health care provider during your annual well check will depend on your age, overall health, lifestyle risk factors, and family history of disease. Counseling  Your health care provider may ask you questions about your: Alcohol use. Tobacco use. Drug use. Emotional well-being. Home and relationship well-being. Sexual activity. Eating habits. History of falls. Memory and ability to understand (cognition). Work and work Statistician. Reproductive health. Screening  You may have the following tests or measurements: Height, weight, and BMI. Blood pressure. Lipid and cholesterol levels. These may be checked every 5 years, or more frequently if you are over 64 years old. Skin check. Lung cancer screening. You may have this screening every year starting at age 7 if you have a 30-pack-year history of smoking and currently smoke or have quit within the past 15 years. Fecal occult blood test (FOBT) of the stool. You may have this test every year starting at age 42. Flexible sigmoidoscopy or colonoscopy. You may have a sigmoidoscopy every 5 years or a colonoscopy every 10 years starting at age  50. Hepatitis C blood test. Hepatitis B blood  test. Sexually transmitted disease (STD) testing. Diabetes screening. This is done by checking your blood sugar (glucose) after you have not eaten for a while (fasting). You may have this done every 1-3 years. Bone density scan. This is done to screen for osteoporosis. You may have this done starting at age 55. Mammogram. This may be done every 1-2 years. Talk to your health care provider about how often you should have regular mammograms. Talk with your health care provider about your test results, treatment options, and if necessary, the need for more tests. Vaccines  Your health care provider may recommend certain vaccines, such as: Influenza vaccine. This is recommended every year. Tetanus, diphtheria, and acellular pertussis (Tdap, Td) vaccine. You may need a Td booster every 10 years. Zoster vaccine. You may need this after age 43. Pneumococcal 13-valent conjugate (PCV13) vaccine. One dose is recommended after age 50. Pneumococcal polysaccharide (PPSV23) vaccine. One dose is recommended after age 15. Talk to your health care provider about which screenings and vaccines you need and how often you need them. This information is not intended to replace advice given to you by your health care provider. Make sure you discuss any questions you have with your health care provider. Document Released: 01/29/2015 Document Revised: 09/22/2015 Document Reviewed: 11/03/2014 Elsevier Interactive Patient Education  2017 [place] Prevention in the Home Falls can cause injuries. They can happen to people of all ages. There are many things you can do to make your home safe and to help prevent falls. What can I do on the outside of my home? Regularly fix the edges of walkways and driveways and fix any cracks. Remove anything that might make you trip as you walk through a door, such as a raised step or threshold. Trim any bushes or trees on the path to your home. Use bright outdoor  lighting. Clear any walking paths of anything that might make someone trip, such as rocks or tools. Regularly check to see if handrails are loose or broken. Make sure that both sides of any steps have handrails. Any raised decks and porches should have guardrails on the edges. Have any leaves, snow, or ice cleared regularly. Use sand or salt on walking paths during winter. Clean up any spills in your garage right away. This includes oil or grease spills. What can I do in the bathroom? Use night lights. Install grab bars by the toilet and in the tub and shower. Do not use towel bars as grab bars. Use non-skid mats or decals in the tub or shower. If you need to sit down in the shower, use a plastic, non-slip stool. Keep the floor dry. Clean up any water that spills on the floor as soon as it happens. Remove soap buildup in the tub or shower regularly. Attach bath mats securely with double-sided non-slip rug tape. Do not have throw rugs and other things on the floor that can make you trip. What can I do in the bedroom? Use night lights. Make sure that you have a Calender by your bed that is easy to reach. Do not use any sheets or blankets that are too big for your bed. They should not hang down onto the floor. Have a firm chair that has side arms. You can use this for support while you get dressed. Do not have throw rugs and other things on the floor that can make you trip. What can I do  in the kitchen? Clean up any spills right away. Avoid walking on wet floors. Keep items that you use a lot in easy-to-reach places. If you need to reach something above you, use a strong step stool that has a grab bar. Keep electrical cords out of the way. Do not use floor polish or wax that makes floors slippery. If you must use wax, use non-skid floor wax. Do not have throw rugs and other things on the floor that can make you trip. What can I do with my stairs? Do not leave any items on the stairs. Make  sure that there are handrails on both sides of the stairs and use them. Fix handrails that are broken or loose. Make sure that handrails are as long as the stairways. Check any carpeting to make sure that it is firmly attached to the stairs. Fix any carpet that is loose or worn. Avoid having throw rugs at the top or bottom of the stairs. If you do have throw rugs, attach them to the floor with carpet tape. Make sure that you have a [place] switch at the top of the stairs and the bottom of the stairs. If you do not have them, ask someone to add them for you. What else can I do to help prevent falls? Wear shoes that: Do not have high heels. Have rubber bottoms. Are comfortable and fit you well. Are closed at the toe. Do not wear sandals. If you use a stepladder: Make sure that it is fully opened. Do not climb a closed stepladder. Make sure that both sides of the stepladder are locked into place. Ask someone to hold it for you, if possible. Clearly mark and make sure that you can see: Any grab bars or handrails. First and last steps. Where the edge of each step is. Use tools that help you move around (mobility aids) if they are needed. These include: Canes. Walkers. Scooters. Crutches. Turn on the lights when you go into a dark area. Replace any [place] bulbs as soon as they burn out. Set up your furniture so you have a clear path. Avoid moving your furniture around. If any of your floors are uneven, fix them. If there are any pets around you, be aware of where they are. Review your medicines with your doctor. Some medicines can make you feel dizzy. This can increase your chance of falling. Ask your doctor what other things that you can do to help prevent falls. This information is not intended to replace advice given to you by your health care provider. Make sure you discuss any questions you have with your health care provider. Document Released: 10/29/2008 Document Revised: 06/10/2015  Document Reviewed: 02/06/2014 Elsevier Interactive Patient Education  2017 [place] Risks of Smoking Smoking tobacco is very bad for your health. Tobacco smoke contains many toxic chemicals that can damage every part of your body. Secondhand smoke can be harmful to those around you. Tobacco or nicotine use can cause many long-term (chronic) diseases. Smoking is difficult to quit because a chemical in tobacco, called nicotine, causes addiction or dependence. When you smoke and inhale, nicotine is absorbed quickly into the bloodstream through your lungs. Both inhaled and non-inhaled nicotine may be addictive. How can quitting affect me? There are health benefits of quitting smoking. Some benefits happen right away and others take time. Benefits may include: Blood flow, blood pressure, heart rate, and lung capacity may begin to improve. However, any lung damage that has  already occurred cannot be repaired. Temporary respiratory symptoms, such as nasal congestion and cough, may improve over time. Your risk of heart disease, stroke, and cancer is reduced. The overall quality of your health may improve. You may save money, as you will not spend money on tobacco products and may spend less money on smoking-related health issues. What can increase my risk? Smoking harms nearly every organ in the body. People who smoke tobacco have a shorter life expectancy and an increased risk of many serious medical problems. These include: More respiratory infections, such as colds and pneumonia. Cancer. Heart disease. Stroke. Chronic respiratory diseases. Delayed wound healing and increased risk of complications during surgery. Problems with reproduction, pregnancy, and childbirth, such as infertility, early (premature) births, stillbirths, and birth defects. Secondhand smoke exposure to children increases the risk of: Sudden infant death syndrome (SIDS). Infections in the nose, throat, or airways  (respiratory infections). Chronic respiratory symptoms. What actions can I take to quit? Smoking is an addiction that affects both your body and your mind, and long-time habits can be hard to change. Your health care provider can recommend: Nicotine replacement products, such as patches, gum, and nasal sprays. Use these products only as directed. Do not replace cigarette smoking with electronic cigarettes, which are commonly called e-cigarettes. The safety of e-cigarettes is not known, and some may contain harmful chemicals. Programs and community resources, which may include group support, education, or talk therapy. Prescription medicines to help reduce cravings. A combination of two or more quit methods, which will increase the success of quitting. Where to find support Follow the recommendations from your health care provider about support groups and other assistance. You can also visit: [employer]: www.naquitline.org or call 1-800-QUIT-NOW. U.S. Department of Health and Human Services: www.smokefree.gov American Lung Association: www.freedomfromsmoking.org American Heart Association: www.heart.org Where to find more information Centers for Disease Control and Prevention: http://www.wolf.info/ World Health Organization: RoleLink.com.br Summary Smoking tobacco is very bad for your health. Tobacco smoke contains many toxic chemicals that can damage every part of the body. Smoking is difficult to quit because a chemical in tobacco, called nicotine, causes addiction or dependence. There are immediate and long-term health benefits of quitting smoking. A combination of two or more quit methods increases the success of quitting. This information is not intended to replace advice given to you by your health care provider. Make sure you discuss any questions you have with your health care provider. Document Revised: 09/06/2020 Document Reviewed: 02/17/2019 Elsevier Patient Education   2022 [employer].

## 2021-03-11 NOTE — Progress Notes (Signed)
Subjective:   Heather [name] is a 66 y.o. female who presents for Medicare Annual (Subsequent) preventive examination.  Virtual Visit via Telephone Note  I connected with  Heather [name] on 03/11/21 at 11:15 AM EST by telephone and verified that I am speaking with the correct person using two identifiers.  Location: Patient: Home Provider: [facility] Persons participating in the virtual visit: patient/Nurse Health Advisor   I discussed the limitations, risks, security and privacy concerns of performing an evaluation and management service by telephone and the availability of in person appointments. The patient expressed understanding and agreed to proceed.  Interactive audio and video telecommunications were attempted between this nurse and patient, however failed, due to patient having technical difficulties OR patient did not have access to video capability.  We continued and completed visit with audio only.  Some vital signs may be absent or patient reported.   [name] E [name], LPN   Review of Systems     Cardiac Risk Factors include: advanced age (>72men, >76 women);dyslipidemia     Objective:    Today's Vitals   03/11/21 1103  Weight: 178 lb (80.7 kg)   Body mass index is 30.55 kg/m.  Advanced Directives 03/11/2021 03/10/2020 10/31/2016 05/12/2015 12/29/2013 12/24/2013  Does Patient Have a Medical Advance Directive? No No No No No No  Would patient like information on creating a medical advance directive? No - Patient declined Yes (Inpatient - patient defers creating a medical advance directive at this time - Information given) - No - patient declined information No - patient declined information No - patient declined information    Current Medications (verified) Outpatient Encounter Medications as of 03/11/2021  Medication Sig   atorvastatin (LIPITOR) 20 MG tablet Take 1 tablet (20 mg total) by mouth daily.   busPIRone (BUSPAR) 30 MG tablet Take 0.5 tablets (15 mg total) by mouth  2 (two) times daily.   diclofenac Sodium (VOLTAREN) 1 % GEL Apply 4 g topically 4 (four) times daily. For knee pain/ arthritis   FLUoxetine (PROZAC) 40 MG capsule Take 1 capsule (40 mg total) by mouth daily.   levothyroxine (SYNTHROID) 112 MCG tablet TAKE 1 TABLET BY MOUTH ONCE DAILY BEFORE BREAKFAST   omeprazole (PRILOSEC) 40 MG capsule Take 1 capsule (40 mg total) by mouth daily.   valACYclovir (VALTREX) 1000 MG tablet TAKE 2 TABLETS BY MOUTH TWICE DAILY FOR 1 DAY AT FEVER BLISTER ONSET   No facility-administered encounter medications on file as of 03/11/2021.    Allergies (verified) Patient has no known allergies.   History: Past Medical History:  Diagnosis Date   Anxiety    Arthritis    Cataract    Depression    Fibromyalgia    GERD (gastroesophageal reflux disease)    Hyperlipidemia    no meds currently   Hypothyroidism    PONV (postoperative nausea and vomiting)    Thyroid disease    hypo   Past Surgical History:  Procedure Laterality Date   CATARACT EXTRACTION Left    CATARACT EXTRACTION W/PHACO Right 12/29/2013   Procedure: CATARACT EXTRACTION PHACO AND INTRAOCULAR LENS PLACEMENT ([place]);  Surgeon: Tonny Branch, MD;  Location: [facility];  Service: Ophthalmology;  Laterality: Right;  CDE:4.84   COLONOSCOPY     EYE SURGERY     cleaned debris from behind eye.   FOOT FRACTURE SURGERY Left    KNEE SURGERY Right    TUBAL LIGATION     tubal ligation reversal     VAGINAL HYSTERECTOMY  Family History  Problem Relation Age of Onset   Colon cancer Mother 64   Rectal cancer Mother    Cancer Mother    Diabetes Father    Heart attack Father 1   Heart disease Father    Diabetes Sister    Leukemia Sister    Heart disease Sister    Acute lymphoblastic leukemia Sister    Diabetes Sister    Heart disease Sister    Thyroid disease Sister    Lung cancer Sister    Cancer - Lung Sister    Diabetes Brother    Heart attack Brother 41   Hyperlipidemia Brother    Diabetes  Brother    Mental illness Brother    Graves' disease Brother    Heart disease Brother    CAD Brother    Diabetes Brother    CAD Brother    Heart disease Brother    Healthy Son    Colon cancer Other 65   Esophageal cancer Neg Hx    Stomach cancer Neg Hx    Social History   Socioeconomic History   Marital status: Married    Spouse name: Not on file   Number of children: 1   Years of education: Not on file   Highest education level: Not on file  Occupational History   Occupation: retired    Comment: Marketing executive and [employer]  Tobacco Use   Smoking status: Every Day    Packs/day: 1.00    Years: 25.00    Pack years: 25.00    Types: Cigarettes   Smokeless tobacco: Never  Vaping Use   Vaping Use: Never used  Substance and Sexual Activity   Alcohol use: No   Drug use: No   Sexual activity: Not Currently    Birth control/protection: Surgical  Other Topics Concern   Not on file  Social History Narrative   Lives home with husband one level home - one son who lives in [place] Determinants of Health   Financial Resource Strain: Low Risk    Difficulty of Paying Living Expenses: Not hard at all  Food Insecurity: No Food Insecurity   Worried About Charity fundraiser in the Last Year: Never true   Arboriculturist in the Last Year: Never true  Transportation Needs: No Transportation Needs   Lack of Transportation (Medical): No   Lack of Transportation (Non-Medical): No  Physical Activity: Insufficiently Active   Days of Exercise per Week: 3 days   Minutes of Exercise per Session: 30 min  Stress: No Stress Concern Present   Feeling of Stress : Not at all  Social Connections: Moderately Integrated   Frequency of Communication with Friends and Family: More than three times a week   Frequency of Social Gatherings with Friends and Family: Twice a week   Attends Religious Services: More than 4 times per year   Active Member of [employer] or Organizations: No   Attends Programme researcher, broadcasting/film/video: Never   Marital Status: Married    Tobacco Counseling Ready to quit: Yes Counseling given: Not Answered   Clinical Intake:  Pre-visit preparation completed: Yes  Pain : No/denies pain     BMI - recorded: 30.55 Nutritional Status: BMI > 30  Obese Nutritional Risks: None Diabetes: No  How often do you need to have someone help you when you read instructions, pamphlets, or other written materials from your doctor or pharmacy?: 1 - Never  Diabetic? no  Interpreter Needed?: No  Information entered by :: Kaamil Morefield, LPN   Activities of Daily Living In your present state of health, do you have any difficulty performing the following activities: 03/11/2021 03/07/2021  Hearing? N N  Vision? N N  Difficulty concentrating or making decisions? N N  Walking or climbing stairs? Y N  Comment sometimes hurts right knee -  Dressing or bathing? N N  Doing errands, shopping? N N  Preparing Food and eating ? N N  Using the Toilet? N N  In the past six months, have you accidently leaked urine? Y Y  Comment wears pantyliners constantly -  Do you have problems with loss of bowel control? N N  Managing your Medications? N N  Managing your Finances? N N  Housekeeping or managing your Housekeeping? N N  Some recent data might be hidden    Patient Care Team: Janora Norlander, DO as PCP - General (Family Medicine)  Indicate any recent Medical Services you may have received from other than [facility] providers in the past year (date may be approximate).     Assessment:   This is a routine wellness examination for Nickol.  Hearing/Vision screen Hearing Screening - Comments:: Denies hearing difficulties   Vision Screening - Comments:: Wears rx glasses - up to date with routine eye exams with [facility] and recently had surgery on eyes at [facility] issues and exercise activities discussed:     Goals Addressed             This Visit's  Progress    Exercise 3x per week (30 min per time)   On track      Depression Screen PHQ 2/9 Scores 03/11/2021 09/08/2020 03/10/2020 01/21/2020 09/03/2018 11/19/2017 04/10/2017  PHQ - 2 Score 0 0 0 0 0 0 0  PHQ- 9 Score - - 1 0 0 - -    Fall Risk Fall Risk  03/11/2021 03/07/2021 09/08/2020 03/10/2020 01/21/2020  Falls in the past year? 0 0 0 1 0  Number falls in past yr: 0 0 - 0 -  Injury with Fall? 0 0 - 1 -  Risk for fall due to : No Fall Risks - - Impaired balance/gait -  Follow up Falls prevention discussed - - Education provided -    FALL RISK PREVENTION PERTAINING TO THE HOME:  Any stairs in or around the home? Yes  If so, are there any without handrails? No  Home free of loose throw rugs in walkways, pet beds, electrical cords, etc? Yes  Adequate lighting in your home to reduce risk of falls? Yes   ASSISTIVE DEVICES UTILIZED TO PREVENT FALLS:  Life alert? No  Use of a cane, walker or w/c? No  Grab bars in the bathroom? No  Shower chair or bench in shower? Yes  Elevated toilet seat or a handicapped toilet? Yes   TIMED UP AND GO:  Was the test performed? No . Telephonic visit  Cognitive Function: Normal cognitive status assessed by direct observation by this Nurse Health Advisor. No abnormalities found.   MMSE - Mini Mental State Exam 03/10/2020  Orientation to time 5  Orientation to Place 3  Registration 3  Attention/ Calculation 5  Recall 3  Language- name 2 objects 2  Language- repeat 1  Language- follow 3 step command 3  Language- read & follow direction 1  Write a sentence 1  Copy design 1  Total score 28  Immunizations Immunization History  Administered Date(s) Administered   Influenza,inj,Quad PF,6+ Mos 10/26/2014, 11/19/2017, 10/01/2018   Influenza-Unspecified 11/18/2019, 10/23/2020   Moderna Sars-Covid-2 Vaccination 04/17/2019, 05/16/2019, 11/18/2019   Td 09/15/2010   Zoster Recombinat (Shingrix) 05/11/2016, 08/31/2016   Zoster, Live 05/12/2015     TDAP status: Due, Education has been provided regarding the importance of this vaccine. Advised may receive this vaccine at local pharmacy or Health Dept. Aware to provide a copy of the vaccination record if obtained from local pharmacy or Health Dept. Verbalized acceptance and understanding.  Flu Vaccine status: Up to date  Pneumococcal vaccine status: Due, Education has been provided regarding the importance of this vaccine. Advised may receive this vaccine at local pharmacy or Health Dept. Aware to provide a copy of the vaccination record if obtained from local pharmacy or Health Dept. Verbalized acceptance and understanding.  Covid-19 vaccine status: Completed vaccines  Qualifies for Shingles Vaccine? Yes   Zostavax completed Yes   Shingrix Completed?: Yes  Screening Tests Health Maintenance  Topic Date Due   Pneumonia Vaccine 51+ Years old (1 - PCV) Never done   COLON CANCER SCREENING ANNUAL FOBT  07/11/2019   COVID-19 Vaccine (4 - Booster for Moderna series) 01/13/2020   TETANUS/TDAP  09/14/2020   MAMMOGRAM  03/10/2021   COLONOSCOPY (Pts 45-56yrs Insurance coverage will need to be confirmed)  07/10/2021   DEXA SCAN  03/12/2022   INFLUENZA VACCINE  Completed   Hepatitis C Screening  Completed   Zoster Vaccines- Shingrix  Completed   HPV VACCINES  Aged Out    Health Maintenance  Health Maintenance Due  Topic Date Due   Pneumonia Vaccine 35+ Years old (1 - PCV) Never done   COLON CANCER SCREENING ANNUAL FOBT  07/11/2019   COVID-19 Vaccine (4 - Booster for Moderna series) 01/13/2020   TETANUS/TDAP  09/14/2020   MAMMOGRAM  03/10/2021    Colorectal cancer screening: Type of screening: Colonoscopy. Completed 07/11/2018. Repeat every 3 years  Mammogram status: Ordered 03/11/21. Pt provided with contact info and advised to call to schedule appt.   Bone Density status: Completed 03/12/2020. Results reflect: Bone density results: OSTEOPENIA. Repeat every 2 years.  Lung  Cancer Screening: (Low Dose CT Chest recommended if Age 27-80 years, 30 pack-year currently smoking OR have quit w/in 15years.) does qualify.   Lung Cancer Screening Referral: patient is interested - needs consult with pcp  Additional Screening:  Hepatitis C Screening: does qualify; Completed 12/01/2014  Vision Screening: Recommended annual ophthalmology exams for early detection of glaucoma and other disorders of the eye. Is the patient up to date with their annual eye exam?  Yes  Who is the provider or what is the name of the office in which the patient attends annual eye exams? [facility] If pt is not established with a provider, would they like to be referred to a provider to establish care? No .   Dental Screening: Recommended annual dental exams for proper oral hygiene  Community Resource Referral / Chronic Care Management: CRR required this visit?  No   CCM required this visit?  No      Plan:     I have personally reviewed and noted the following in the patients chart:   Medical and social history Use of alcohol, tobacco or illicit drugs  Current medications and supplements including opioid prescriptions.  Functional ability and status Nutritional status Physical activity Advanced directives List of other physicians Hospitalizations, surgeries, and ER visits in previous 66  months Vitals Screenings to include cognitive, depression, and falls Referrals and appointments  In addition, I have reviewed and discussed with patient certain preventive protocols, quality metrics, and best practice recommendations. A written personalized care plan for preventive services as well as general preventive health recommendations were provided to patient.   Due to this being a telephonic visit, the after visit summary with patients personalized plan was offered to patient via mail or my-chart. Patient would like to access on my-chart   Sandrea Hammond, LPN   6/38/[id number]    Nurse Notes: Recommended lung cancer screening - she is interested - needs consult with PCP first.

## 2021-03-14 ENCOUNTER — Encounter: Payer: Self-pay | Admitting: Family Medicine

## 2021-03-14 ENCOUNTER — Ambulatory Visit (INDEPENDENT_AMBULATORY_CARE_PROVIDER_SITE_OTHER): Payer: Medicare HMO | Admitting: Family Medicine

## 2021-03-14 VITALS — BP 154/89 | HR 82 | Temp 97.8°F | Ht 64.0 in | Wt 179.2 lb

## 2021-03-14 DIAGNOSIS — Z23 Encounter for immunization: Secondary | ICD-10-CM | POA: Diagnosis not present

## 2021-03-14 DIAGNOSIS — Z0001 Encounter for general adult medical examination with abnormal findings: Secondary | ICD-10-CM

## 2021-03-14 DIAGNOSIS — Z720 Tobacco use: Secondary | ICD-10-CM | POA: Diagnosis not present

## 2021-03-14 DIAGNOSIS — F331 Major depressive disorder, recurrent, moderate: Secondary | ICD-10-CM

## 2021-03-14 DIAGNOSIS — Z0000 Encounter for general adult medical examination without abnormal findings: Secondary | ICD-10-CM

## 2021-03-14 DIAGNOSIS — E785 Hyperlipidemia, unspecified: Secondary | ICD-10-CM

## 2021-03-14 DIAGNOSIS — E034 Atrophy of thyroid (acquired): Secondary | ICD-10-CM

## 2021-03-14 DIAGNOSIS — R030 Elevated blood-pressure reading, without diagnosis of hypertension: Secondary | ICD-10-CM | POA: Diagnosis not present

## 2021-03-14 DIAGNOSIS — K219 Gastro-esophageal reflux disease without esophagitis: Secondary | ICD-10-CM

## 2021-03-14 DIAGNOSIS — B0089 Other herpesviral infection: Secondary | ICD-10-CM

## 2021-03-14 DIAGNOSIS — F411 Generalized anxiety disorder: Secondary | ICD-10-CM

## 2021-03-14 DIAGNOSIS — R69 Illness, unspecified: Secondary | ICD-10-CM | POA: Diagnosis not present

## 2021-03-14 MED ORDER — VALACYCLOVIR HCL 1 G PO TABS: ORAL_TABLET | ORAL | 0 refills | Status: DC

## 2021-03-14 MED ORDER — FLUOXETINE HCL 40 MG PO CAPS: 40.0000 mg | ORAL_CAPSULE | Freq: Every day | ORAL | 3 refills | Status: DC

## 2021-03-14 MED ORDER — VARENICLINE TARTRATE 1 MG PO TABS: 1.0000 mg | ORAL_TABLET | Freq: Two times a day (BID) | ORAL | 3 refills | Status: DC

## 2021-03-14 MED ORDER — OMEPRAZOLE 40 MG PO CPDR: 40.0000 mg | DELAYED_RELEASE_CAPSULE | Freq: Every day | ORAL | 3 refills | Status: DC

## 2021-03-14 MED ORDER — BUSPIRONE HCL 30 MG PO TABS: 15.0000 mg | ORAL_TABLET | Freq: Two times a day (BID) | ORAL | 3 refills | Status: DC

## 2021-03-14 MED ORDER — VARENICLINE TARTRATE 0.5 MG X 11 & 1 MG X 42 PO TBPK: ORAL_TABLET | ORAL | 0 refills | Status: DC

## 2021-03-14 MED ORDER — ATORVASTATIN CALCIUM 20 MG PO TABS: 20.0000 mg | ORAL_TABLET | Freq: Every day | ORAL | 3 refills | Status: DC

## 2021-03-14 NOTE — Patient Instructions (Signed)
You had labs performed today.  You will be contacted with the results of the labs once they are available, usually in the next 3 business days for routine lab work.  If you have an active my chart account, they will be released to your MyChart.  If you prefer to have these labs released to you via telephone, please let us know.  Preventive Care 34 Years and Older, Female Preventive care refers to lifestyle choices and visits with your health care provider that can promote health and wellness. Preventive care visits are also called wellness exams. What can I expect for my preventive care visit? Counseling Your health care provider may ask you questions about your: Medical history, including: Past medical problems. Family medical history. Pregnancy and menstrual history. History of falls. Current health, including: Memory and ability to understand (cognition). Emotional well-being. Home life and relationship well-being. Sexual activity and sexual health. Lifestyle, including: Alcohol, nicotine or tobacco, and drug use. Access to firearms. Diet, exercise, and sleep habits. Work and work Statistician. Sunscreen use. Safety issues such as seatbelt and bike helmet use. Physical exam Your health care provider will check your: Height and weight. These may be used to calculate your BMI (body mass index). BMI is a measurement that tells if you are at a healthy weight. Waist circumference. This measures the distance around your waistline. This measurement also tells if you are at a healthy weight and may help predict your risk of certain diseases, such as type 2 diabetes and high blood pressure. Heart rate and blood pressure. Body temperature. Skin for abnormal spots. What immunizations do I need? Vaccines are usually given at various ages, according to a schedule. Your health care provider will recommend vaccines for you based on your age, medical history, and lifestyle or other factors, such as  travel or where you work. What tests do I need? Screening Your health care provider may recommend screening tests for certain conditions. This may include: Lipid and cholesterol levels. Hepatitis C test. Hepatitis B test. HIV (human immunodeficiency virus) test. STI (sexually transmitted infection) testing, if you are at risk. Lung cancer screening. Colorectal cancer screening. Diabetes screening. This is done by checking your blood sugar (glucose) after you have not eaten for a while (fasting). Mammogram. Talk with your health care provider about how often you should have regular mammograms. BRCA-related cancer screening. This may be done if you have a family history of breast, ovarian, tubal, or peritoneal cancers. Bone density scan. This is done to screen for osteoporosis. Talk with your health care provider about your test results, treatment options, and if necessary, the need for more tests. Follow these instructions at home: Eating and drinking  Eat a diet that includes fresh fruits and vegetables, whole grains, lean protein, and low-fat dairy products. Limit your intake of foods with high amounts of sugar, saturated fats, and salt. Take vitamin and mineral supplements as recommended by your health care provider. Do not drink alcohol if your health care provider tells you not to drink. If you drink alcohol: Limit how much you have to 0-1 drink a day. Know how much alcohol is in your drink. In the [place] one drink equals one 12 oz bottle of beer (355 mL), one 5 oz glass of wine (148 mL), or one 1 oz glass of hard liquor (44 mL). Lifestyle Brush your teeth every morning and night with fluoride toothpaste. Floss one time each day. Exercise for at least 30 minutes 5 or more days each  week. Do not use any products that contain nicotine or tobacco. These products include cigarettes, chewing tobacco, and vaping devices, such as e-cigarettes. If you need help quitting, ask your health care  provider. Do not use drugs. If you are sexually active, practice safe sex. Use a condom or other form of protection in order to prevent STIs. Take aspirin only as told by your health care provider. Make sure that you understand how much to take and what form to take. Work with your health care provider to find out whether it is safe and beneficial for you to take aspirin daily. Ask your health care provider if you need to take a cholesterol-lowering medicine (statin). Find healthy ways to manage stress, such as: Meditation, yoga, or listening to music. Journaling. Talking to a trusted person. Spending time with friends and family. Minimize exposure to UV radiation to reduce your risk of skin cancer. Safety Always wear your seat belt while driving or riding in a vehicle. Do not drive: If you have been drinking alcohol. Do not ride with someone who has been drinking. When you are tired or distracted. While texting. If you have been using any mind-altering substances or drugs. Wear a helmet and other protective equipment during sports activities. If you have firearms in your house, make sure you follow all gun safety procedures. What's next? Visit your health care provider once a year for an annual wellness visit. Ask your health care provider how often you should have your eyes and teeth checked. Stay up to date on all vaccines. This information is not intended to replace advice given to you by your health care provider. Make sure you discuss any questions you have with your health care provider. Document Revised: 06/30/2020 Document Reviewed: 06/30/2020 Elsevier Patient Education  [place].

## 2021-03-14 NOTE — Progress Notes (Signed)
Heather [name] is a 65 y.o. female presents to office today for annual physical exam examination.    Concerns today include: 1. Tobacco use She is ready for smoking cessation and would like more information on options.  No hemoptysis, unplanned weight loss, night sweats, change in voice, difficulty swallowing, peripheral changes/ pain  Substance use: tobacco Diet: fair, not high in fiber, Exercise: stays active Last eye exam: needs with Dr Marin Comment, s/p right eye YAG surgery Last colonoscopy: q3 years, due this summer Last mammogram: UTD Last pap smear: na Refills needed today: all? Immunizations needed: PNA, Tdap Immunization History  Administered Date(s) Administered   Influenza,inj,Quad PF,6+ Mos 10/26/2014, 11/19/2017, 10/01/2018   Influenza-Unspecified 11/18/2019, 10/23/2020   Moderna Sars-Covid-2 Vaccination 04/17/2019, 05/16/2019, 11/18/2019   Td 09/15/2010   Zoster Recombinat (Shingrix) 05/11/2016, 08/31/2016   Zoster, Live 05/12/2015     Past Medical History:  Diagnosis Date   Anxiety    Arthritis    Cataract    Depression    Fibromyalgia    GERD (gastroesophageal reflux disease)    Hyperlipidemia    no meds currently   Hypothyroidism    PONV (postoperative nausea and vomiting)    Thyroid disease    hypo   Social History   Socioeconomic History   Marital status: Married    Spouse name: Leisure centre manager   Number of children: 1   Years of education: Not on file   Highest education level: Not on file  Occupational History   Occupation: retired    Comment: Marketing executive and [employer]  Tobacco Use   Smoking status: Every Day    Packs/day: 1.00    Years: 25.00    Pack years: 25.00    Types: Cigarettes   Smokeless tobacco: Never  Vaping Use   Vaping Use: Never used  Substance and Sexual Activity   Alcohol use: No   Drug use: No   Sexual activity: Not Currently    Birth control/protection: Surgical  Other Topics Concern   Not on file  Social History Narrative    Lives home with husband one level home   One son by first husband who lives in [place] Determinants of Health   Financial Resource Strain: Low Risk    Difficulty of Paying Living Expenses: Not hard at all  Food Insecurity: No Food Insecurity   Worried About Charity fundraiser in the Last Year: Never true   Arboriculturist in the Last Year: Never true  Transportation Needs: No Transportation Needs   Lack of Transportation (Medical): No   Lack of Transportation (Non-Medical): No  Physical Activity: Insufficiently Active   Days of Exercise per Week: 3 days   Minutes of Exercise per Session: 30 min  Stress: No Stress Concern Present   Feeling of Stress : Not at all  Social Connections: Moderately Integrated   Frequency of Communication with Friends and Family: More than three times a week   Frequency of Social Gatherings with Friends and Family: Twice a week   Attends Religious Services: More than 4 times per year   Active Member of [employer] or Organizations: No   Attends Archivist Meetings: Never   Marital Status: Married  Human resources officer Violence: Not At Risk   Fear of Current or Ex-Partner: No   Emotionally Abused: No   Physically Abused: No   Sexually Abused: No   Past Surgical History:  Procedure Laterality Date   CATARACT EXTRACTION Left  CATARACT EXTRACTION W/PHACO Right 12/29/2013   Procedure: CATARACT EXTRACTION PHACO AND INTRAOCULAR LENS PLACEMENT (I[facility]);  Surgeon: Tonny Branch, MD;  Location: [facility];  Service: Ophthalmology;  Laterality: Right;  CDE:4.84   COLONOSCOPY     EYE SURGERY     cleaned debris from behind eye.   FOOT FRACTURE SURGERY Left    KNEE SURGERY Right    TUBAL LIGATION     tubal ligation reversal     VAGINAL HYSTERECTOMY     Family History  Problem Relation Age of Onset   Colon cancer Mother 32   Rectal cancer Mother    Cancer Mother    Cancer - Colon Mother    Diabetes Father    Heart attack Father 14   Heart disease Father     Diabetes Sister    Leukemia Sister    Heart disease Sister    Acute lymphoblastic leukemia Sister    Diabetes Sister    Heart disease Sister    Thyroid disease Sister    Lung cancer Sister    Cancer - Lung Sister    Diabetes Brother    Heart attack Brother 32   Hyperlipidemia Brother    Diabetes Brother    Mental illness Brother    Graves' disease Brother    Heart disease Brother    CAD Brother    Diabetes Brother    CAD Brother    Heart disease Brother    Healthy Son    Colon cancer Other 12   Esophageal cancer Neg Hx    Stomach cancer Neg Hx     Current Outpatient Medications:    atorvastatin (LIPITOR) 20 MG tablet, Take 1 tablet (20 mg total) by mouth daily., Disp: 90 tablet, Rfl: 3   busPIRone (BUSPAR) 30 MG tablet, Take 0.5 tablets (15 mg total) by mouth 2 (two) times daily., Disp: 90 tablet, Rfl: 3   diclofenac Sodium (VOLTAREN) 1 % GEL, Apply 4 g topically 4 (four) times daily. For knee pain/ arthritis, Disp: 400 g, Rfl: 0   FLUoxetine (PROZAC) 40 MG capsule, Take 1 capsule (40 mg total) by mouth daily., Disp: 90 capsule, Rfl: 3   levothyroxine (SYNTHROID) 112 MCG tablet, TAKE 1 TABLET BY MOUTH ONCE DAILY BEFORE BREAKFAST, Disp: 90 tablet, Rfl: 3   omeprazole (PRILOSEC) 40 MG capsule, Take 1 capsule (40 mg total) by mouth daily., Disp: 90 capsule, Rfl: 3   valACYclovir (VALTREX) 1000 MG tablet, TAKE 2 TABLETS BY MOUTH TWICE DAILY FOR 1 DAY AT FEVER BLISTER ONSET, Disp: 20 tablet, Rfl: 0  No Known Allergies   ROS: Review of Systems Pertinent items noted in HPI and remainder of comprehensive ROS otherwise negative.    Physical exam BP (!) 161/92    Pulse 82    Temp 97.8 F (36.6 C)    Ht 5\' 4"  (1.626 m)    Wt 179 lb 3.2 oz (81.3 kg)    SpO2 97%    BMI 30.76 kg/m  General appearance: alert, cooperative, appears stated age, and no distress Head: Normocephalic, without obvious abnormality, atraumatic Eyes: negative findings: lids and lashes normal, conjunctivae  and sclerae normal, corneas clear, and pupils equal, round, reactive to Kwan and accomodation Ears: normal TM's and external ear canals both ears Nose: Nares normal. Septum midline. Mucosa normal. No drainage or sinus tenderness. Throat:  Dental Caries Present Neck: no adenopathy, no carotid bruit, supple, symmetrical, trachea midline, and thyroid not enlarged, symmetric, no tenderness/mass/nodules Back: symmetric, no curvature. ROM normal.  No CVA tenderness. Lungs:  Globally decreased breath sounds but no appreciable rhonchi or rails.  She has normal work of breathing on room air Heart: regular rate and rhythm, S1, S2 normal, no murmur, click, rub or gallop Abdomen: soft, non-tender; bowel sounds normal; no masses,  no organomegaly Extremities: extremities normal, atraumatic, no cyanosis or edema Pulses: 2+ and symmetric Skin: Skin color, texture, turgor normal. No rashes or lesions Lymph nodes: Cervical, supraclavicular, and axillary nodes normal. Neurologic: Grossly normal  Assessment/ Plan: [name] here for annual physical exam.   Annual physical exam  Need for pneumococcal vaccination - Plan: Pneumococcal conjugate vaccine 20-valent (Prevnar 20)  Need for tetanus booster - Plan: Tdap vaccine greater than or equal to 7yo IM  Hypothyroidism due to acquired atrophy of thyroid - Plan: TSH, T4, Free  Elevated blood pressure reading without diagnosis of hypertension  Hyperlipidemia with target LDL less than 100 - Plan: atorvastatin (LIPITOR) 20 MG tablet  Tobacco use - Plan: varenicline (CHANTIX PAK) 0.5 MG X 11 & 1 MG X 42 tablet, varenicline (CHANTIX) 1 MG tablet  GAD (generalized anxiety disorder) - Plan: busPIRone (BUSPAR) 30 MG tablet  Moderate episode of recurrent major depressive disorder (HCC) - Plan: FLUoxetine (PROZAC) 40 MG capsule  Gastroesophageal reflux disease without esophagitis - Plan: omeprazole (PRILOSEC) 40 MG capsule  Recurrent oral herpes simplex  infection - Plan: valACYclovir (VALTREX) 1000 MG tablet  Both pneumococcal and tetanus vaccinations administered today  Check thyroid levels.  Continue Synthroid at current dose  Continue Lipitor 20 mg daily  Blood pressure is not at goal.  I would like her to be reevaluated in the next month or so for recheck  We discussed options for tobacco cessation and we will proceed with Chantix.  Discussed potential side effects.  She will follow-up as needed  BuSpar, Prozac, Prilosec and Valtrex renewed.  All of these issues were stable  Counseled on healthy lifestyle choices, including diet (rich in fruits, vegetables and lean meats and low in salt and simple carbohydrates) and exercise (at least 30 minutes of moderate physical activity daily).   Darelle Kings M. Lajuana Ripple, DO

## 2021-03-15 ENCOUNTER — Encounter: Payer: Self-pay | Admitting: Family Medicine

## 2021-03-15 LAB — TSH: TSH: 2.15 u[IU]/mL (ref 0.450–4.500)

## 2021-03-15 LAB — T4, FREE: Free T4: 1.32 ng/dL (ref 0.82–1.77)

## 2021-03-17 ENCOUNTER — Other Ambulatory Visit: Payer: Self-pay | Admitting: Family Medicine

## 2021-03-17 DIAGNOSIS — Z1231 Encounter for screening mammogram for malignant neoplasm of breast: Secondary | ICD-10-CM

## 2021-04-20 ENCOUNTER — Ambulatory Visit
Admission: RE | Admit: 2021-04-20 | Discharge: 2021-04-20 | Disposition: A | Discharge status: Home / Self Care | Payer: Medicare HMO | Source: Ambulatory Visit | Attending: Family Medicine | Admitting: Family Medicine

## 2021-04-20 DIAGNOSIS — Z1231 Encounter for screening mammogram for malignant neoplasm of breast: Secondary | ICD-10-CM | POA: Diagnosis not present

## 2021-06-29 ENCOUNTER — Encounter: Payer: Self-pay | Admitting: Gastroenterology

## 2021-08-04 ENCOUNTER — Ambulatory Visit (AMBULATORY_SURGERY_CENTER): Payer: Medicare HMO

## 2021-08-04 VITALS — Ht 64.0 in | Wt 164.0 lb

## 2021-08-04 DIAGNOSIS — Z800 Family history of malignant neoplasm of digestive organs: Secondary | ICD-10-CM

## 2021-08-04 DIAGNOSIS — Z86010 Personal history of colonic polyps: Secondary | ICD-10-CM

## 2021-08-04 MED ORDER — NA SULFATE-K SULFATE-MG SULF 17.5-3.13-1.6 GM/177ML PO SOLN: 1.0000 | Freq: Once | ORAL | 0 refills | Status: AC

## 2021-08-04 NOTE — Progress Notes (Signed)
No egg or soy allergy known to patient  No issues known to pt with past sedation with any surgeries or procedures Patient denies ever being told they had issues or difficulty with intubation  No FH of Malignant Hyperthermia Pt is not on diet pills Pt is not on  home 02  Pt is not on blood thinners  Pt denies issues with constipation  No A fib or A flutter  Instructions also sent via mychart   Pt instructed to use Singlecare.com or GoodRx for a price reduction on prep   Suprep

## 2021-08-08 ENCOUNTER — Encounter: Payer: Self-pay | Admitting: Gastroenterology

## 2021-08-25 ENCOUNTER — Encounter: Payer: Self-pay | Admitting: Gastroenterology

## 2021-09-01 ENCOUNTER — Ambulatory Visit (AMBULATORY_SURGERY_CENTER): Payer: Medicare HMO | Admitting: Gastroenterology

## 2021-09-01 ENCOUNTER — Encounter: Payer: Self-pay | Admitting: Gastroenterology

## 2021-09-01 VITALS — BP 112/86 | HR 74 | Temp 98.9°F | Resp 12 | Ht 64.0 in | Wt 164.0 lb

## 2021-09-01 DIAGNOSIS — Z09 Encounter for follow-up examination after completed treatment for conditions other than malignant neoplasm: Secondary | ICD-10-CM | POA: Diagnosis not present

## 2021-09-01 DIAGNOSIS — Z86010 Personal history of colonic polyps: Secondary | ICD-10-CM

## 2021-09-01 DIAGNOSIS — Z800 Family history of malignant neoplasm of digestive organs: Secondary | ICD-10-CM

## 2021-09-01 DIAGNOSIS — D120 Benign neoplasm of cecum: Secondary | ICD-10-CM | POA: Diagnosis not present

## 2021-09-01 DIAGNOSIS — K621 Rectal polyp: Secondary | ICD-10-CM | POA: Diagnosis not present

## 2021-09-01 DIAGNOSIS — D128 Benign neoplasm of rectum: Secondary | ICD-10-CM

## 2021-09-01 DIAGNOSIS — D122 Benign neoplasm of ascending colon: Secondary | ICD-10-CM

## 2021-09-01 MED ORDER — SODIUM CHLORIDE 0.9 % IV SOLN: 500.0000 mL | Freq: Once | INTRAVENOUS | Status: DC

## 2021-09-01 NOTE — Progress Notes (Signed)
[facility] History and Physical   Primary Care Physician:  Janora Norlander, DO   Reason for Procedure:   History of polyps  Plan:    colonoscopy     HPI: Heather [name] is a 66 y.o. female  here for colonoscopy surveillance - mother with CRC age 33s, had 7 adenomas removed 06/2018. Patient denies any bowel symptoms at this time.  Otherwise feels well without any cardiopulmonary symptoms.   I have discussed risks / benefits of anesthesia and endoscopic procedure with [name] and they wish to proceed with the exams as outlined today.    Past Medical History:  Diagnosis Date   Anxiety    Arthritis    Cataract    Depression    Fibromyalgia    GERD (gastroesophageal reflux disease)    Hyperlipidemia    no meds currently   Hypothyroidism    PONV (postoperative nausea and vomiting)    Thyroid disease    hypo    Past Surgical History:  Procedure Laterality Date   CATARACT EXTRACTION Left    CATARACT EXTRACTION W/PHACO Right 12/29/2013   Procedure: CATARACT EXTRACTION PHACO AND INTRAOCULAR LENS PLACEMENT ([place]);  Surgeon: Tonny Branch, MD;  Location: [facility];  Service: Ophthalmology;  Laterality: Right;  CDE:4.84   COLONOSCOPY     EYE SURGERY     cleaned debris from behind eye.   FOOT FRACTURE SURGERY Left 2017   heel   KNEE SURGERY Right 2018   torn meniscus   TUBAL LIGATION     tubal ligation reversal     VAGINAL HYSTERECTOMY      Prior to Admission medications   Medication Sig Start Date End Date Taking? Authorizing Provider  busPIRone (BUSPAR) 30 MG tablet Take 0.5 tablets (15 mg total) by mouth 2 (two) times daily. 03/14/21  Yes Gottschalk, Ashly M, DO  FLUoxetine (PROZAC) 40 MG capsule Take 1 capsule (40 mg total) by mouth daily. 03/14/21  Yes Gottschalk, Leatrice Jewels M, DO  levothyroxine (SYNTHROID) 112 MCG tablet TAKE 1 TABLET BY MOUTH ONCE DAILY BEFORE BREAKFAST 09/29/20  Yes Gottschalk, Ashly M, DO  omeprazole (PRILOSEC) 40 MG capsule Take 1 capsule (40  mg total) by mouth daily. 03/14/21  Yes Gottschalk, Ashly M, DO  psyllium (REGULOID) 0.52 g capsule Take 0.52 g by mouth daily.   Yes [provider]  diclofenac Sodium (VOLTAREN) 1 % GEL Apply 4 g topically 4 (four) times daily. For knee pain/ arthritis 03/12/20   Ronnie Doss M, DO  valACYclovir (VALTREX) 1000 MG tablet TAKE 2 TABLETS BY MOUTH TWICE DAILY FOR 1 DAY AT FEVER BLISTER ONSET. Put on hold 03/14/21   Janora Norlander, DO    Current Outpatient Medications  Medication Sig Dispense Refill   busPIRone (BUSPAR) 30 MG tablet Take 0.5 tablets (15 mg total) by mouth 2 (two) times daily. 90 tablet 3   FLUoxetine (PROZAC) 40 MG capsule Take 1 capsule (40 mg total) by mouth daily. 90 capsule 3   levothyroxine (SYNTHROID) 112 MCG tablet TAKE 1 TABLET BY MOUTH ONCE DAILY BEFORE BREAKFAST 90 tablet 3   omeprazole (PRILOSEC) 40 MG capsule Take 1 capsule (40 mg total) by mouth daily. 90 capsule 3   psyllium (REGULOID) 0.52 g capsule Take 0.52 g by mouth daily.     diclofenac Sodium (VOLTAREN) 1 % GEL Apply 4 g topically 4 (four) times daily. For knee pain/ arthritis 400 g 0   valACYclovir (VALTREX) 1000 MG tablet TAKE 2 TABLETS BY  MOUTH TWICE DAILY FOR 1 DAY AT FEVER BLISTER ONSET. Put on hold 20 tablet 0   Current Facility-Administered Medications  Medication Dose Route Frequency Provider Last Rate Last Admin   0.9 %  sodium chloride infusion  500 mL Intravenous Once Nour Rodrigues, Carlota Raspberry, MD        Allergies as of 09/01/2021   (No Known Allergies)    Family History  Problem Relation Age of Onset   Colon polyps Mother    Colon cancer Mother 21   Cancer Mother    Cancer - Colon Mother    Diabetes Father    Heart attack Father 88   Heart disease Father    Diabetes Sister    Leukemia Sister    Heart disease Sister    Acute lymphoblastic leukemia Sister    Diabetes Sister    Heart disease Sister    Thyroid disease Sister    Lung cancer Sister    Cancer - Lung Sister     Diabetes Brother    Heart attack Brother 16   Hyperlipidemia Brother    Diabetes Brother    Mental illness Brother    Graves' disease Brother    Heart disease Brother    CAD Brother    Diabetes Brother    CAD Brother    Heart disease Brother    Healthy Son    Rectal cancer Other    Colon cancer Other 62   Esophageal cancer Neg Hx    Stomach cancer Neg Hx    Breast cancer Neg Hx     Social History   Socioeconomic History   Marital status: Married    Spouse name: Leisure centre manager   Number of children: 1   Years of education: Not on file   Highest education level: Not on file  Occupational History   Occupation: retired    Comment: Marketing executive and [employer]  Tobacco Use   Smoking status: Every Day    Packs/day: 1.00    Years: 25.00    Total pack years: 25.00    Types: Cigarettes   Smokeless tobacco: Never  Vaping Use   Vaping Use: Never used  Substance and Sexual Activity   Alcohol use: Never   Drug use: Never   Sexual activity: Not Currently    Birth control/protection: Surgical  Other Topics Concern   Not on file  Social History Narrative   Lives home with husband one level home   One son by first husband who lives in [place] Determinants of Health   Financial Resource Strain: Xenia  (03/11/2021)   Overall Financial Resource Strain (CARDIA)    Difficulty of Paying Living Expenses: Not hard at all  Food Insecurity: No Food Insecurity (03/11/2021)   Hunger Vital Sign    Worried About Running Out of Food in the Last Year: Never true    Ran Out of Food in the Last Year: Never true  Transportation Needs: No Transportation Needs (03/11/2021)   PRAPARE - Hydrologist (Medical): No    Lack of Transportation (Non-Medical): No  Physical Activity: Insufficiently Active (03/11/2021)   Exercise Vital Sign    Days of Exercise per Week: 3 days    Minutes of Exercise per Session: 30 min  Stress: No Stress Concern Present (03/11/2021)    [place]    Feeling of Stress : Not at all  Social Connections: Moderately Integrated (03/11/2021)  Social Licensed conveyancer [NHANES]    Frequency of Communication with Friends and Family: More than three times a week    Frequency of Social Gatherings with Friends and Family: Twice a week    Attends Religious Services: More than 4 times per year    Active Member of [employer] or Organizations: No    Attends Archivist Meetings: Never    Marital Status: Married  Human resources officer Violence: Not At Risk (03/11/2021)   Humiliation, Afraid, Rape, and Kick questionnaire    Fear of Current or Ex-Partner: No    Emotionally Abused: No    Physically Abused: No    Sexually Abused: No    Review of Systems: All other review of systems negative except as mentioned in the HPI.  Physical Exam: Vital signs BP 132/74   Pulse 86   Temp 98.9 F (37.2 C) (Temporal)   Ht '5\' 4"'$  (1.626 m)   Wt 164 lb (74.4 kg)   SpO2 97%   BMI 28.15 kg/m   General:   Alert,  Well-developed, pleasant and cooperative in NAD Lungs:  Clear throughout to auscultation.   Heart:  Regular rate and rhythm Abdomen:  Soft, nontender and nondistended.   Neuro/Psych:  Alert and cooperative. Normal mood and affect. A and O x 3  Jolly Mango, MD [facility]

## 2021-09-01 NOTE — Progress Notes (Signed)
Pt's states no medical or surgical changes since previsit or office visit. 

## 2021-09-01 NOTE — Patient Instructions (Signed)
Handouts on polyps, diverticulosis, and hemorrhoids given to you today   YOU HAD AN ENDOSCOPIC PROCEDURE TODAY AT [facility]:   Refer to the procedure report that was given to you for any specific questions about what was found during the examination.  If the procedure report does not answer your questions, please call your gastroenterologist to clarify.  If you requested that your care partner not be given the details of your procedure findings, then the procedure report has been included in a sealed envelope for you to review at your convenience later.  YOU SHOULD EXPECT: Some feelings of bloating in the abdomen. Passage of more gas than usual.  Walking can help get rid of the air that was put into your GI tract during the procedure and reduce the bloating. If you had a lower endoscopy (such as a colonoscopy or flexible sigmoidoscopy) you may notice spotting of blood in your stool or on the toilet paper. If you underwent a bowel prep for your procedure, you may not have a normal bowel movement for a few days.  Please Note:  You might notice some irritation and congestion in your nose or some drainage.  This is from the oxygen used during your procedure.  There is no need for concern and it should clear up in a day or so.  SYMPTOMS TO REPORT IMMEDIATELY:  Following lower endoscopy (colonoscopy or flexible sigmoidoscopy):  Excessive amounts of blood in the stool  Significant tenderness or worsening of abdominal pains  Swelling of the abdomen that is new, acute  Fever of 100F or higher  For urgent or emergent issues, a gastroenterologist can be reached at any hour by calling [phone]. Do not use MyChart messaging for urgent concerns.    DIET:  We do recommend a small meal at first, but then you may proceed to your regular diet.  Drink plenty of fluids but you should avoid alcoholic beverages for 24 hours.  ACTIVITY:  You should plan to take it easy for the rest of today  and you should NOT DRIVE or use heavy machinery until tomorrow (because of the sedation medicines used during the test).    FOLLOW UP: Our staff will call the number listed on your records the next business day following your procedure.  We will call around 7:15- 8:00 am to check on you and address any questions or concerns that you may have regarding the information given to you following your procedure. If we do not reach you, we will leave a message.  If you develop any symptoms (ie: fever, flu-like symptoms, shortness of breath, cough etc.) before then, please call [phone].  If you test positive for Covid 19 in the 2 weeks post procedure, please call and report this information to [place].    If any biopsies were taken you will be contacted by phone or by letter within the next 1-3 weeks.  Please call us at [phone] if you have not heard about the biopsies in 3 weeks.    SIGNATURES/CONFIDENTIALITY: You and/or your care partner have signed paperwork which will be entered into your electronic medical record.  These signatures attest to the fact that that the information above on your After Visit Summary has been reviewed and is understood.  Full responsibility of the confidentiality of this discharge information lies with you and/or your care-partner.

## 2021-09-01 NOTE — Progress Notes (Signed)
PT taken to PACU. Monitors in place. VSS. Report given to RN. 

## 2021-09-01 NOTE — Op Note (Signed)
[place] Patient Name: Heather [name] Procedure Date: 09/01/2021 7:06 AM MRN: [id number] Endoscopist: Remo Lipps P. Havery Moros , MD Age: 66 Referring MD:  Date of Birth: 05-11-1955 Gender: Female Account #: [id number] Procedure:                Colonoscopy Indications:              High risk colon cancer surveillance: Personal                            history of colonic polyps - 7 adenomas removed                            06/2018, mother with colon cancer dx age 73s Medicines:                Monitored Anesthesia Care Procedure:                Pre-Anesthesia Assessment:                           - Prior to the procedure, a History and Physical                            was performed, and patient medications and                            allergies were reviewed. The patient's tolerance of                            previous anesthesia was also reviewed. The risks                            and benefits of the procedure and the sedation                            options and risks were discussed with the patient.                            All questions were answered, and informed consent                            was obtained. Prior Anticoagulants: The patient has                            taken no previous anticoagulant or antiplatelet                            agents. ASA Grade Assessment: II - A patient with                            mild systemic disease. After reviewing the risks                            and benefits, the patient was deemed in  satisfactory condition to undergo the procedure.                           After obtaining informed consent, the colonoscope                            was passed under direct vision. Throughout the                            procedure, the patient's blood pressure, pulse, and                            oxygen saturations were monitored continuously. The                            Olympus PCF-H190DL  (#[id number]) Colonoscope was                            introduced through the anus and advanced to the the                            cecum, identified by appendiceal orifice and                            ileocecal valve. The colonoscopy was performed                            without difficulty. The patient tolerated the                            procedure well. The quality of the bowel                            preparation was adequate. The ileocecal valve,                            appendiceal orifice, and rectum were photographed. Scope In: 8:02:39 AM Scope Out: 8:26:17 AM Scope Withdrawal Time: 0 hours 16 minutes 33 seconds  Total Procedure Duration: 0 hours 23 minutes 38 seconds  Findings:                 The perianal and digital rectal examinations were                            normal.                           A diminutive polyp was found in the cecum. The                            polyp was sessile. The polyp was removed with a                            cold snare. Resection and retrieval were complete.  A 3 to 4 mm polyp was found in the ascending colon.                            The polyp was sessile. The polyp was removed with a                            cold snare. Resection and retrieval were complete.                           Two sessile polyps were found in the rectum. The                            polyps were 2 to 3 mm in size. These polyps were                            removed with a cold snare. Resection and retrieval                            were complete.                           A few small-mouthed diverticula were found in the                            sigmoid colon. The sigmoid colon was rather narrow                            and prolonged cecal intubation.                           Internal hemorrhoids were found during                            retroflexion. The hemorrhoids were small.                           The  exam was otherwise without abnormality. Complications:            No immediate complications. Estimated blood loss:                            Minimal. Estimated Blood Loss:     Estimated blood loss was minimal. Impression:               - One diminutive polyp in the cecum, removed with a                            cold snare. Resected and retrieved.                           - One 3 to 4 mm polyp in the ascending colon,                            removed with a cold snare.  Resected and retrieved.                           - Two 2 to 3 mm polyps in the rectum, removed with                            a cold snare. Resected and retrieved.                           - Diverticulosis in the sigmoid colon with luminal                            narrowing.                           - Internal hemorrhoids.                           - The examination was otherwise normal. Recommendation:           - Patient has a contact number available for                            emergencies. The signs and symptoms of potential                            delayed complications were discussed with the                            patient. Return to normal activities tomorrow.                            Written discharge instructions were provided to the                            patient.                           - Resume previous diet.                           - Continue present medications.                           - Await pathology results. Remo Lipps P. Aida Lemaire, MD 09/01/2021 8:32:38 AM This report has been signed electronically.

## 2021-09-02 ENCOUNTER — Telehealth: Payer: Self-pay | Admitting: *Deleted

## 2021-09-02 NOTE — Telephone Encounter (Signed)
  Follow up Call-     09/01/2021    7:11 AM  Call back number  Post procedure Call Back phone  # [phone]  Permission to leave phone message Yes     Patient questions:  Do you have a fever, pain , or abdominal swelling? No. Pain Score  0 *  Have you tolerated food without any problems? Yes.    Have you been able to return to your normal activities? Yes.    Do you have any questions about your discharge instructions: Diet   No. Medications  No. Follow up visit  No.  Do you have questions or concerns about your Care? No.  Actions: * If pain score is 4 or above: No action needed, pain <4.

## 2021-09-12 ENCOUNTER — Encounter: Payer: Self-pay | Admitting: Family Medicine

## 2021-09-12 ENCOUNTER — Ambulatory Visit (INDEPENDENT_AMBULATORY_CARE_PROVIDER_SITE_OTHER): Payer: Medicare HMO | Admitting: Family Medicine

## 2021-09-12 DIAGNOSIS — Z720 Tobacco use: Secondary | ICD-10-CM | POA: Diagnosis not present

## 2021-09-12 DIAGNOSIS — Z780 Asymptomatic menopausal state: Secondary | ICD-10-CM

## 2021-09-12 DIAGNOSIS — E559 Vitamin D deficiency, unspecified: Secondary | ICD-10-CM | POA: Diagnosis not present

## 2021-09-12 DIAGNOSIS — E785 Hyperlipidemia, unspecified: Secondary | ICD-10-CM | POA: Diagnosis not present

## 2021-09-12 DIAGNOSIS — M8580 Other specified disorders of bone density and structure, unspecified site: Secondary | ICD-10-CM | POA: Diagnosis not present

## 2021-09-12 DIAGNOSIS — Z833 Family history of diabetes mellitus: Secondary | ICD-10-CM

## 2021-09-12 DIAGNOSIS — E034 Atrophy of thyroid (acquired): Secondary | ICD-10-CM | POA: Diagnosis not present

## 2021-09-12 NOTE — Progress Notes (Signed)
Telephone visit  Subjective: CV:[id number] PCP: Janora Norlander, DO NID:POEU H Heather [name] is a 66 y.o. female calls for telephone consult today. Patient provides verbal consent for consult held via phone.  Due to COVID-19 pandemic this visit was conducted virtually. This visit type was conducted due to national recommendations for restrictions regarding the COVID-19 Pandemic (e.g. social distancing, sheltering in place) in an effort to limit this patient's exposure and mitigate transmission in our community. All issues noted in this document were discussed and addressed.  A physical exam was not performed with this format.   Location of patient: home Location of provider: [facility] Others present for call: none  1. Hypothyroidism She reports compliance with thyroid replacement medication.  No reports of tremor, heart palpitations or changes in bowel habits.  She is been having some increased anxiety surrounding the illness of one of her family members, who will be undergoing a valve replacement soon.  2.  Hyperlipidemia, tobacco use disorder Patient missed that she has discontinued the statin because her friend showed her an article where statins cause diabetes.  She has been trying to lose a little weight because she recognizes that this impacts her health as well.  She continues to drink half a soda per day but this is a reduction in what she used to drink.  She continues to smoke and unfortunately could not afford the Chantix as it was over $100.  No chest pain or shortness of breath reported.   ROS: Per HPI  No Known Allergies Past Medical History:  Diagnosis Date   Anxiety    Arthritis    Cataract    Depression    Fibromyalgia    GERD (gastroesophageal reflux disease)    Hyperlipidemia    no meds currently   Hypothyroidism    PONV (postoperative nausea and vomiting)    Thyroid disease    hypo    Current Outpatient Medications:    busPIRone (BUSPAR) 30 MG tablet, Take 0.5  tablets (15 mg total) by mouth 2 (two) times daily., Disp: 90 tablet, Rfl: 3   diclofenac Sodium (VOLTAREN) 1 % GEL, Apply 4 g topically 4 (four) times daily. For knee pain/ arthritis, Disp: 400 g, Rfl: 0   FLUoxetine (PROZAC) 40 MG capsule, Take 1 capsule (40 mg total) by mouth daily., Disp: 90 capsule, Rfl: 3   levothyroxine (SYNTHROID) 112 MCG tablet, TAKE 1 TABLET BY MOUTH ONCE DAILY BEFORE BREAKFAST, Disp: 90 tablet, Rfl: 3   omeprazole (PRILOSEC) 40 MG capsule, Take 1 capsule (40 mg total) by mouth daily., Disp: 90 capsule, Rfl: 3   psyllium (REGULOID) 0.52 g capsule, Take 0.52 g by mouth daily., Disp: , Rfl:    valACYclovir (VALTREX) 1000 MG tablet, TAKE 2 TABLETS BY MOUTH TWICE DAILY FOR 1 DAY AT FEVER BLISTER ONSET. Put on hold, Disp: 20 tablet, Rfl: 0  Assessment/ Plan: 66 y.o. female   Hyperlipidemia with target LDL less than 100 - Plan: Lipid panel, CMP14+EGFR  Hypothyroidism due to acquired atrophy of thyroid - Plan: TSH, T4, free  Tobacco use - Plan: CBC  Osteopenia after menopause - Plan: VITAMIN D 25 Hydroxy (Vit-D Deficiency, Fractures)  Vitamin D deficiency - Plan: VITAMIN D 25 Hydroxy (Vit-D Deficiency, Fractures)  Family history of diabetes mellitus - Plan: Bayer DCA Hb A1c Waived  Fasting labs have been ordered.  She unfortunately is off of statin due to an article she read about it causing diabetes and she did not want to risk since there is  a strong family history of that.  I counseled her on getting rid of her sodas as excessive consumption of sugar is also leading cause of diabetes.  We will check A1c along with the rest of her fasting labs.  She was amenable to restarting statin if cholesterol remains uncontrolled.  Check thyroid levels.  Asymptomatic from a thyroid standpoint  CBC ordered given ongoing tobacco use.  I gave her the manufacture telephone number for patient assistance for Chantix as she is interested in going back to that medication but  unfortunately is unaffordable to her at this time  Vitamin D level ordered given history of vitamin D deficiency and known osteopenia.  DEXA is not due again until 2024   Start time: 10:11a (no answer), 10:21a(no answer); 10:22am (lVM on home #); 10:33a End time: 10:46a  Total time spent on patient care (including telephone call/ virtual visit): 13 minutes  [place], [place] [phone]

## 2021-09-20 ENCOUNTER — Other Ambulatory Visit: Payer: Medicare HMO

## 2021-09-20 DIAGNOSIS — Z833 Family history of diabetes mellitus: Secondary | ICD-10-CM | POA: Diagnosis not present

## 2021-09-20 DIAGNOSIS — E559 Vitamin D deficiency, unspecified: Secondary | ICD-10-CM

## 2021-09-20 DIAGNOSIS — M8580 Other specified disorders of bone density and structure, unspecified site: Secondary | ICD-10-CM | POA: Diagnosis not present

## 2021-09-20 DIAGNOSIS — E785 Hyperlipidemia, unspecified: Secondary | ICD-10-CM | POA: Diagnosis not present

## 2021-09-20 DIAGNOSIS — Z720 Tobacco use: Secondary | ICD-10-CM

## 2021-09-20 DIAGNOSIS — E034 Atrophy of thyroid (acquired): Secondary | ICD-10-CM | POA: Diagnosis not present

## 2021-09-20 DIAGNOSIS — R739 Hyperglycemia, unspecified: Secondary | ICD-10-CM | POA: Diagnosis not present

## 2021-09-20 DIAGNOSIS — Z780 Asymptomatic menopausal state: Secondary | ICD-10-CM | POA: Diagnosis not present

## 2021-09-20 LAB — BAYER DCA HB A1C WAIVED: HB A1C (BAYER DCA - WAIVED): 5.4 % (ref 4.8–5.6)

## 2021-09-21 LAB — CMP14+EGFR
ALT: 7 IU/L (ref 0–32)
AST: 14 IU/L (ref 0–40)
Albumin/Globulin Ratio: 1.8 (ref 1.2–2.2)
Albumin: 4.4 g/dL (ref 3.9–4.9)
Alkaline Phosphatase: 93 IU/L (ref 44–121)
BUN/Creatinine Ratio: 19 (ref 12–28)
BUN: 12 mg/dL (ref 8–27)
Bilirubin Total: 0.3 mg/dL (ref 0.0–1.2)
CO2: 21 mmol/L (ref 20–29)
Calcium: 10 mg/dL (ref 8.7–10.3)
Chloride: 103 mmol/L (ref 96–106)
Creatinine, Ser: 0.62 mg/dL (ref 0.57–1.00)
Globulin, Total: 2.5 g/dL (ref 1.5–4.5)
Glucose: 88 mg/dL (ref 70–99)
Potassium: 4.3 mmol/L (ref 3.5–5.2)
Sodium: 138 mmol/L (ref 134–144)
Total Protein: 6.9 g/dL (ref 6.0–8.5)
eGFR: 98 mL/min/{1.73_m2} (ref >59)

## 2021-09-21 LAB — LIPID PANEL
Chol/HDL Ratio: 5.1 ratio — ABNORMAL HIGH (ref 0.0–4.4)
Cholesterol, Total: 221 mg/dL — ABNORMAL HIGH (ref 100–199)
HDL: 43 mg/dL (ref >39)
LDL Chol Calc (NIH): 150 mg/dL — ABNORMAL HIGH (ref 0–99)
Triglycerides: 152 mg/dL — ABNORMAL HIGH (ref 0–149)
VLDL Cholesterol Cal: 28 mg/dL (ref 5–40)

## 2021-09-21 LAB — CBC
Hematocrit: 40.5 % (ref 34.0–46.6)
Hemoglobin: 13.6 g/dL (ref 11.1–15.9)
MCH: 30.2 pg (ref 26.6–33.0)
MCHC: 33.6 g/dL (ref 31.5–35.7)
MCV: 90 fL (ref 79–97)
Platelets: 355 10*3/uL (ref 150–450)
RBC: 4.5 x10E6/uL (ref 3.77–5.28)
RDW: 13 % (ref 11.7–15.4)
WBC: 8.2 10*3/uL (ref 3.4–10.8)

## 2021-09-21 LAB — TSH: TSH: 1.38 u[IU]/mL (ref 0.450–4.500)

## 2021-09-21 LAB — T4, FREE: Free T4: 1.47 ng/dL (ref 0.82–1.77)

## 2021-09-21 LAB — VITAMIN D 25 HYDROXY (VIT D DEFICIENCY, FRACTURES): Vit D, 25-Hydroxy: 24 ng/mL — ABNORMAL LOW (ref 30.0–100.0)

## 2021-11-05 ENCOUNTER — Other Ambulatory Visit: Payer: Self-pay | Admitting: Family Medicine

## 2022-02-02 ENCOUNTER — Telehealth: Payer: Self-pay | Admitting: Family Medicine

## 2022-03-13 ENCOUNTER — Telehealth: Payer: Self-pay

## 2022-03-13 ENCOUNTER — Ambulatory Visit (INDEPENDENT_AMBULATORY_CARE_PROVIDER_SITE_OTHER): Payer: Medicare HMO

## 2022-03-13 VITALS — Ht 64.0 in | Wt 179.0 lb

## 2022-03-13 DIAGNOSIS — Z0000 Encounter for general adult medical examination without abnormal findings: Secondary | ICD-10-CM | POA: Diagnosis not present

## 2022-03-13 NOTE — Patient Instructions (Addendum)
Heather [name] , Thank you for taking time to come for your Medicare Wellness Visit. I appreciate your ongoing commitment to your health goals. Please review the following plan we discussed and let me know if I can assist you in the future.   These are the goals we discussed:  Goals      Exercise 3x per week (30 min per time)        This is a list of the screening recommended for you and due dates:  Health Maintenance  Topic Date Due   Screening for Lung Cancer  08/20/2007   COVID-19 Vaccine (4 - 2023-24 season) 09/16/2021   DEXA scan (bone density measurement)  03/12/2022   Flu Shot  04/16/2022*   Mammogram  04/21/2022   Medicare Annual Wellness Visit  03/14/2023   Colon Cancer Screening  09/02/2026   DTaP/Tdap/Td vaccine (3 - Td or Tdap) 03/15/2031   Pneumonia Vaccine  Completed   Hepatitis C Screening: USPSTF Recommendation to screen - Ages 6-79 yo.  Completed   Zoster (Shingles) Vaccine  Completed   HPV Vaccine  Aged Out  *Topic was postponed. The date shown is not the original due date.    Advanced directives: Forms are available if you choose in the future to pursue completion.  This is recommended in order to make sure that your health wishes are honored in the event that you are unable to verbalize them to the provider.    Conditions/risks identified: Aim for 30 minutes of exercise or brisk walking, 6-8 glasses of water, and 5 servings of fruits and vegetables each day.   Next appointment: Follow up in one year for your annual wellness visit    Preventive Care 65 Years and Older, Female Preventive care refers to lifestyle choices and visits with your health care provider that can promote health and wellness. What does preventive care include? A yearly physical exam. This is also called an annual well check. Dental exams once or twice a year. Routine eye exams. Ask your health care provider how often you should have your eyes checked. Personal lifestyle choices,  including: Daily care of your teeth and gums. Regular physical activity. Eating a healthy diet. Avoiding tobacco and drug use. Limiting alcohol use. Practicing safe sex. Taking low-dose aspirin every day. Taking vitamin and mineral supplements as recommended by your health care provider. What happens during an annual well check? The services and screenings done by your health care provider during your annual well check will depend on your age, overall health, lifestyle risk factors, and family history of disease. Counseling  Your health care provider may ask you questions about your: Alcohol use. Tobacco use. Drug use. Emotional well-being. Home and relationship well-being. Sexual activity. Eating habits. History of falls. Memory and ability to understand (cognition). Work and work Statistician. Reproductive health. Screening  You may have the following tests or measurements: Height, weight, and BMI. Blood pressure. Lipid and cholesterol levels. These may be checked every 5 years, or more frequently if you are over 50 years old. Skin check. Lung cancer screening. You may have this screening every year starting at age 42 if you have a 30-pack-year history of smoking and currently smoke or have quit within the past 15 years. Fecal occult blood test (FOBT) of the stool. You may have this test every year starting at age 33. Flexible sigmoidoscopy or colonoscopy. You may have a sigmoidoscopy every 5 years or a colonoscopy every 10 years starting at age 54. Hepatitis C  blood test. Hepatitis B blood test. Sexually transmitted disease (STD) testing. Diabetes screening. This is done by checking your blood sugar (glucose) after you have not eaten for a while (fasting). You may have this done every 1-3 years. Bone density scan. This is done to screen for osteoporosis. You may have this done starting at age 48. Mammogram. This may be done every 1-2 years. Talk to your health care provider  about how often you should have regular mammograms. Talk with your health care provider about your test results, treatment options, and if necessary, the need for more tests. Vaccines  Your health care provider may recommend certain vaccines, such as: Influenza vaccine. This is recommended every year. Tetanus, diphtheria, and acellular pertussis (Tdap, Td) vaccine. You may need a Td booster every 10 years. Zoster vaccine. You may need this after age 73. Pneumococcal 13-valent conjugate (PCV13) vaccine. One dose is recommended after age 5. Pneumococcal polysaccharide (PPSV23) vaccine. One dose is recommended after age 44. Talk to your health care provider about which screenings and vaccines you need and how often you need them. This information is not intended to replace advice given to you by your health care provider. Make sure you discuss any questions you have with your health care provider. Document Released: 01/29/2015 Document Revised: 09/22/2015 Document Reviewed: 11/03/2014 Elsevier Interactive Patient Education  2017 [place] Prevention in the Home Falls can cause injuries. They can happen to people of all ages. There are many things you can do to make your home safe and to help prevent falls. What can I do on the outside of my home? Regularly fix the edges of walkways and driveways and fix any cracks. Remove anything that might make you trip as you walk through a door, such as a raised step or threshold. Trim any bushes or trees on the path to your home. Use bright outdoor lighting. Clear any walking paths of anything that might make someone trip, such as rocks or tools. Regularly check to see if handrails are loose or broken. Make sure that both sides of any steps have handrails. Any raised decks and porches should have guardrails on the edges. Have any leaves, snow, or ice cleared regularly. Use sand or salt on walking paths during winter. Clean up any spills in  your garage right away. This includes oil or grease spills. What can I do in the bathroom? Use night lights. Install grab bars by the toilet and in the tub and shower. Do not use towel bars as grab bars. Use non-skid mats or decals in the tub or shower. If you need to sit down in the shower, use a plastic, non-slip stool. Keep the floor dry. Clean up any water that spills on the floor as soon as it happens. Remove soap buildup in the tub or shower regularly. Attach bath mats securely with double-sided non-slip rug tape. Do not have throw rugs and other things on the floor that can make you trip. What can I do in the bedroom? Use night lights. Make sure that you have a [place] by your bed that is easy to reach. Do not use any sheets or blankets that are too big for your bed. They should not hang down onto the floor. Have a firm chair that has side arms. You can use this for support while you get dressed. Do not have throw rugs and other things on the floor that can make you trip. What can I do in the kitchen?  Clean up any spills right away. Avoid walking on wet floors. Keep items that you use a lot in easy-to-reach places. If you need to reach something above you, use a strong step stool that has a grab bar. Keep electrical cords out of the way. Do not use floor polish or wax that makes floors slippery. If you must use wax, use non-skid floor wax. Do not have throw rugs and other things on the floor that can make you trip. What can I do with my stairs? Do not leave any items on the stairs. Make sure that there are handrails on both sides of the stairs and use them. Fix handrails that are broken or loose. Make sure that handrails are as long as the stairways. Check any carpeting to make sure that it is firmly attached to the stairs. Fix any carpet that is loose or worn. Avoid having throw rugs at the top or bottom of the stairs. If you do have throw rugs, attach them to the floor with carpet  tape. Make sure that you have a Lesch switch at the top of the stairs and the bottom of the stairs. If you do not have them, ask someone to add them for you. What else can I do to help prevent falls? Wear shoes that: Do not have high heels. Have rubber bottoms. Are comfortable and fit you well. Are closed at the toe. Do not wear sandals. If you use a stepladder: Make sure that it is fully opened. Do not climb a closed stepladder. Make sure that both sides of the stepladder are locked into place. Ask someone to hold it for you, if possible. Clearly mark and make sure that you can see: Any grab bars or handrails. First and last steps. Where the edge of each step is. Use tools that help you move around (mobility aids) if they are needed. These include: Canes. Walkers. Scooters. Crutches. Turn on the lights when you go into a dark area. Replace any D[place]en bulbs as soon as they burn out. Set up your furniture so you have a clear path. Avoid moving your furniture around. If any of your floors are uneven, fix them. If there are any pets around you, be aware of where they are. Review your medicines with your doctor. Some medicines can make you feel dizzy. This can increase your chance of falling. Ask your doctor what other things that you can do to help prevent falls. This information is not intended to replace advice given to you by your health care provider. Make sure you discuss any questions you have with your health care provider. Document Released: 10/29/2008 Document Revised: 06/10/2015 Document Reviewed: 02/06/2014 Elsevier Interactive Patient Education  2017 [employer].

## 2022-03-13 NOTE — Progress Notes (Signed)
Subjective:   Heather [name] is a 67 y.o. female who presents for Medicare Annual (Subsequent) preventive examination.  Review of Systems     Cardiac Risk Factors include: advanced age (>26mn, >>78women);smoking/ tobacco exposure;dyslipidemia;hypertension     Objective:    Today's Vitals   03/13/22 1359  Weight: 179 lb (81.2 kg)  Height: '5\' 4"'$  (1.626 m)   Body mass index is 30.73 kg/m.     03/11/2021   11:09 AM 03/10/2020   11:04 AM 10/31/2016    1:23 PM 05/12/2015    2:04 PM 12/29/2013    7:13 AM 12/24/2013   12:54 PM  Advanced Directives  Does Patient Have a Medical Advance Directive? No No No No No No  Would patient like information on creating a medical advance directive? No - Patient declined Yes (Inpatient - patient defers creating a medical advance directive at this time - Information given)  No - patient declined information No - patient declined information No - patient declined information    Current Medications (verified) Outpatient Encounter Medications as of 03/13/2022  Medication Sig   atorvastatin (LIPITOR) 20 MG tablet Take 20 mg by mouth daily.   busPIRone (BUSPAR) 30 MG tablet Take 0.5 tablets (15 mg total) by mouth 2 (two) times daily.   diclofenac Sodium (VOLTAREN) 1 % GEL Apply 4 g topically 4 (four) times daily. For knee pain/ arthritis   FLUoxetine (PROZAC) 40 MG capsule Take 1 capsule (40 mg total) by mouth daily.   levothyroxine (SYNTHROID) 112 MCG tablet TAKE 1 TABLET BY MOUTH ONCE DAILY BEFORE BREAKFAST   omeprazole (PRILOSEC) 40 MG capsule Take 1 capsule (40 mg total) by mouth daily.   psyllium (REGULOID) 0.52 g capsule Take 0.52 g by mouth daily.   valACYclovir (VALTREX) 1000 MG tablet TAKE 2 TABLETS BY MOUTH TWICE DAILY FOR 1 DAY AT FEVER BLISTER ONSET. Put on hold   No facility-administered encounter medications on file as of 03/13/2022.    Allergies (verified) Patient has no known allergies.   History: Past Medical History:  Diagnosis  Date   Anxiety    Arthritis    Cataract    Depression    Fibromyalgia    GERD (gastroesophageal reflux disease)    Hyperlipidemia    no meds currently   Hypothyroidism    PONV (postoperative nausea and vomiting)    Thyroid disease    hypo   Past Surgical History:  Procedure Laterality Date   CATARACT EXTRACTION Left    CATARACT EXTRACTION W/PHACO Right 12/29/2013   Procedure: CATARACT EXTRACTION PHACO AND INTRAOCULAR LENS PLACEMENT (ITappen;  Surgeon: KTonny Branch MD;  Location: [facility];  Service: Ophthalmology;  Laterality: Right;  CDE:4.84   COLONOSCOPY     EYE SURGERY     cleaned debris from behind eye.   FOOT FRACTURE SURGERY Left 2017   heel   KNEE SURGERY Right 2018   torn meniscus   TUBAL LIGATION     tubal ligation reversal     VAGINAL HYSTERECTOMY     Family History  Problem Relation Age of Onset   Colon polyps Mother    Colon cancer Mother 667  Cancer Mother    Cancer - Colon Mother    Diabetes Father    Heart attack Father 640  Heart disease Father    Diabetes Sister    Leukemia Sister    Heart disease Sister    Acute lymphoblastic leukemia Sister    Diabetes Sister  Heart disease Sister    Thyroid disease Sister    Lung cancer Sister    Cancer - Lung Sister    Diabetes Brother    Heart attack Brother 92   Hyperlipidemia Brother    Diabetes Brother    Mental illness Brother    Graves' disease Brother    Heart disease Brother    CAD Brother    Diabetes Brother    CAD Brother    Heart disease Brother    Healthy Son    Rectal cancer Other    Colon cancer Other 43   Esophageal cancer Neg Hx    Stomach cancer Neg Hx    Breast cancer Neg Hx    Social History   Socioeconomic History   Marital status: Married    Spouse name: Quinton   Number of children: 1   Years of education: Not on file   Highest education level: Not on file  Occupational History   Occupation: retired    Comment: Marketing executive and [employer]  Tobacco Use   Smoking status:  Every Day    Packs/day: 1.00    Years: 25.00    Total pack years: 25.00    Types: Cigarettes   Smokeless tobacco: Never  Vaping Use   Vaping Use: Never used  Substance and Sexual Activity   Alcohol use: Never   Drug use: Never   Sexual activity: Not Currently    Birth control/protection: Surgical  Other Topics Concern   Not on file  Social History Narrative   Lives home with husband one level home   One son by first husband who lives in [place] Determinants of Health   Financial Resource Strain: [place]  (03/11/2021)   Overall Financial Resource Strain (CARDIA)    Difficulty of Paying Living Expenses: Not hard at all  Food Insecurity: No Food Insecurity (03/11/2021)   Hunger Vital Sign    Worried About Running Out of Food in the Last Year: Never true    Ran Out of Food in the Last Year: Never true  Transportation Needs: No Transportation Needs (03/11/2021)   PRAPARE - Hydrologist (Medical): No    Lack of Transportation (Non-Medical): No  Physical Activity: Insufficiently Active (03/11/2021)   Exercise Vital Sign    Days of Exercise per Week: 3 days    Minutes of Exercise per Session: 30 min  Stress: No Stress Concern Present (03/11/2021)   [place]    Feeling of Stress : Not at all  Social Connections: Moderately Integrated (03/11/2021)   Social Connection and Isolation Panel [NHANES]    Frequency of Communication with Friends and Family: More than three times a week    Frequency of Social Gatherings with Friends and Family: Twice a week    Attends Religious Services: More than 4 times per year    Active Member of [employer] or Organizations: No    Attends Music therapist: Never    Marital Status: Married    Tobacco Counseling Ready to quit: No Counseling given: Not Answered   Clinical Intake:  Pre-visit preparation completed: Yes  Pain : No/denies  pain  Diabetes: No  How often do you need to have someone help you when you read instructions, pamphlets, or other written materials from your doctor or pharmacy?: 1 - Never  Diabetic?No   Interpreter Needed?: No  Information entered by :: Denman George LPN  Activities of Daily Living    03/13/2022    2:01 PM 03/12/2022    7:39 PM  In your present state of health, do you have any difficulty performing the following activities:  Hearing? 0 0  Vision? 0 0  Difficulty concentrating or making decisions? 0 0  Walking or climbing stairs? 0 0  Dressing or bathing? 0 0  Doing errands, shopping? 0 0  Preparing Food and eating ? N N  Using the Toilet? N N  In the past six months, have you accidently leaked urine? Y Y  Do you have problems with loss of bowel control? N N  Managing your Medications? N N  Managing your Finances? N N  Housekeeping or managing your Housekeeping? N N    Patient Care Team: Janora Norlander, DO as PCP - [facility] (Ophthalmology) Harlen Labs, MD as Referring Physician (Optometry) Armbruster, Carlota Raspberry, MD as Consulting Physician (Gastroenterology)  Indicate any recent Medical Services you may have received from other than [facility] providers in the past year (date may be approximate).     Assessment:   This is a routine wellness examination for Fani.  Hearing/Vision screen Hearing Screening - Comments:: Denies hearing difficulties  Vision Screening - Comments:: up to date with routine eye exams with Dr. Manuella Ghazi   Dietary issues and exercise activities discussed:     Goals Addressed   None    Depression Screen    03/14/2021    9:41 AM 03/14/2021    9:18 AM 03/11/2021   10:37 AM 09/08/2020   10:11 AM 03/10/2020   11:01 AM 01/21/2020    3:58 PM 09/03/2018    4:20 PM  PHQ 2/9 Scores  PHQ - 2 Score 0 0 0 0 0 0 0  PHQ- 9 Score     1 0 0    Fall Risk    03/13/2022    2:00 PM 03/12/2022    7:39 PM  03/14/2021    9:41 AM 03/14/2021    9:18 AM 03/11/2021   10:38 AM  Fall Risk   Falls in the past year? 0 0 0 0 0  Number falls in past yr: 0 0   0  Injury with Fall? 0 0   0  Risk for fall due to :     No Fall Risks  Follow up Falls prevention discussed;Education provided;Falls evaluation completed    Falls prevention discussed    FALL RISK PREVENTION PERTAINING TO THE HOME:  Any stairs in or around the home? No  If so, are there any without handrails? No  Home free of loose throw rugs in walkways, pet beds, electrical cords, etc? Yes  Adequate lighting in your home to reduce risk of falls? Yes   ASSISTIVE DEVICES UTILIZED TO PREVENT FALLS:  Life alert? No  Use of a cane, walker or w/c? No  Grab bars in the bathroom? Yes  Shower chair or bench in shower? No  Elevated toilet seat or a handicapped toilet? Yes   TIMED UP AND GO:  Was the test performed? No . Telephonic visit   Cognitive Function:    03/10/2020   11:05 AM  MMSE - Mini Mental State Exam  Orientation to time 5  Orientation to Place 3  Registration 3  Attention/ Calculation 5  Recall 3  Language- name 2 objects 2  Language- repeat 1  Language- follow 3 step command 3  Language- read & follow direction 1  Write a sentence 1  Copy design 1  Total score 28        03/13/2022    2:01 PM 03/11/2021   11:18 AM  6CIT Screen  What Year? 0 points 0 points  What month? 0 points 0 points  What time? 0 points 0 points  Count back from 20 0 points 0 points  Months in reverse 0 points 0 points  Repeat phrase 0 points 0 points  Total Score 0 points 0 points    Immunizations Immunization History  Administered Date(s) Administered   Influenza,inj,Quad PF,6+ Mos 10/26/2014, 11/19/2017, 10/01/2018   Influenza-Unspecified 11/18/2019, 10/23/2020   Moderna Sars-Covid-2 Vaccination 04/17/2019, 05/16/2019, 11/18/2019   PNEUMOCOCCAL CONJUGATE-20 03/14/2021   Td 09/15/2010   Tdap 03/14/2021   Zoster Recombinat  (Shingrix) 05/11/2016, 08/31/2016   Zoster, Live 05/12/2015    TDAP status: Up to date  Flu Vaccine status: Declined, Education has been provided regarding the importance of this vaccine but patient still declined. Advised may receive this vaccine at local pharmacy or Health Dept. Aware to provide a copy of the vaccination record if obtained from local pharmacy or Health Dept. Verbalized acceptance and understanding.  Pneumococcal vaccine status: Up to date  Covid-19 vaccine status: Information provided on how to obtain vaccines.   Qualifies for Shingles Vaccine? Yes   Zostavax completed No   Shingrix Completed?: Yes  Screening Tests Health Maintenance  Topic Date Due   Lung Cancer Screening  08/20/2007   COVID-19 Vaccine (4 - 2023-24 season) 09/16/2021   Medicare Annual Wellness (AWV)  03/11/2022   DEXA SCAN  03/12/2022   INFLUENZA VACCINE  04/16/2022 (Originally 08/16/2021)   MAMMOGRAM  04/21/2022   COLONOSCOPY (Pts 45-41yr Insurance coverage will need to be confirmed)  09/02/2026   DTaP/Tdap/Td (3 - Td or Tdap) 03/15/2031   Pneumonia Vaccine 67 Years old  Completed   Hepatitis C Screening  Completed   Zoster Vaccines- Shingrix  Completed   HPV VACCINES  Aged Out    Health Maintenance  Health Maintenance Due  Topic Date Due   Lung Cancer Screening  08/20/2007   COVID-19 Vaccine (4 - 2023-24 season) 09/16/2021   Medicare Annual Wellness (AWV)  03/11/2022   DEXA SCAN  03/12/2022    Colorectal cancer screening: Type of screening: Colonoscopy. Completed 09/01/21. Repeat every 5 years  Mammogram status: Completed 04/20/21. Repeat every year  Bone Density status: Completed 03/12/20. Results reflect: Bone density results: OSTEOPENIA. Repeat every 2 years.  Lung Cancer Screening: (Low Dose CT Chest recommended if Age 668-80years, 30 pack-year currently smoking OR have quit w/in 15years.) does qualify.   Lung Cancer Screening Referral: will discuss with provider   Additional  Screening:  Hepatitis C Screening: does qualify; Completed 12/01/14  Vision Screening: Recommended annual ophthalmology exams for early detection of glaucoma and other disorders of the eye. Is the patient up to date with their annual eye exam?  Yes  Who is the provider or what is the name of the office in which the patient attends annual eye exams? Dr. SManuella GhaziIf pt is not established with a provider, would they like to be referred to a provider to establish care? No .   Dental Screening: Recommended annual dental exams for proper oral hygiene  Community Resource Referral / Chronic Care Management: CRR required this visit?  No   CCM required this visit?  No      Plan:     I have personally reviewed and noted the following in  the patient's chart:   Medical and social history Use of alcohol, tobacco or illicit drugs  Current medications and supplements including opioid prescriptions. Patient is not currently taking opioid prescriptions. Functional ability and status Nutritional status Physical activity Advanced directives List of other physicians Hospitalizations, surgeries, and ER visits in previous 12 months Vitals Screenings to include cognitive, depression, and falls Referrals and appointments  In addition, I have reviewed and discussed with patient certain preventive protocols, quality metrics, and best practice recommendations. A written personalized care plan for preventive services as well as general preventive health recommendations were provided to patient.     Vanetta Mulders, [place]   [id number]   Due to this being a virtual visit, the after visit summary with patients personalized plan was offered to patient via mail or my-chart. per request, patient was mailed a copy of AVS.  Nurse Notes: Patient states that she sometimes

## 2022-03-15 NOTE — Telephone Encounter (Signed)
error 

## 2022-03-23 NOTE — Progress Notes (Signed)
I connected with  [name] on 03/13/2022 by a audio enabled telemedicine application and verified that I am speaking with the correct person using two identifiers.  Patient Location: Home  Provider Location: Home Office  I discussed the limitations of evaluation and management by telemedicine. The patient expressed understanding and agreed to proceed.

## 2022-04-17 ENCOUNTER — Other Ambulatory Visit: Payer: Self-pay | Admitting: Family Medicine

## 2022-04-17 DIAGNOSIS — F411 Generalized anxiety disorder: Secondary | ICD-10-CM

## 2022-04-20 ENCOUNTER — Other Ambulatory Visit: Payer: Self-pay | Admitting: Family Medicine

## 2022-04-20 DIAGNOSIS — F411 Generalized anxiety disorder: Secondary | ICD-10-CM

## 2022-04-20 NOTE — Telephone Encounter (Signed)
  Prescription Request  04/20/2022  Is this a "Controlled Substance" medicine? no  Have you seen your PCP in the last 2 weeks? No pt has appt on 06/09/22  If YES, route message to pool  -  If NO, patient needs to be scheduled for appointment.  What is the name of the medication or equipment? Buspar, atorvastatin (LIPITOR) 20 MG tablet  FLUoxetine (PROZAC) 40 MG capsule, omeprazole (PRILOSEC) 40 MG capsule  valACYclovir (VALTREX) 1000 MG tablet  Have you contacted your pharmacy to request a refill? yes   Which pharmacy would you like this sent to? [facility] [place]    Patient notified that their request is being sent to the clinical staff for review and that they should receive a response within 2 business days.

## 2022-05-02 ENCOUNTER — Telehealth: Payer: Self-pay | Admitting: Gastroenterology

## 2022-05-02 NOTE — Telephone Encounter (Signed)
Inbound call from patient looking for  a sooner appt, stated she is in pain and can't wait until 6/27.please advise

## 2022-05-03 NOTE — Telephone Encounter (Signed)
Called and left patient a detailed vm on her mobile phone. I informed patient that she has the next available appt with Dr. Adela Lank. Pt can call back to discuss a sooner appt with an APP but they are booking a month out as well. I advised pt that if she is in severe pain she should seek evaluation at urgent care or reach out to PCP.

## 2022-05-09 DIAGNOSIS — M7989 Other specified soft tissue disorders: Secondary | ICD-10-CM | POA: Diagnosis not present

## 2022-05-09 DIAGNOSIS — M25522 Pain in left elbow: Secondary | ICD-10-CM | POA: Diagnosis not present

## 2022-05-09 DIAGNOSIS — F419 Anxiety disorder, unspecified: Secondary | ICD-10-CM | POA: Diagnosis not present

## 2022-05-09 DIAGNOSIS — Z720 Tobacco use: Secondary | ICD-10-CM | POA: Diagnosis not present

## 2022-05-09 DIAGNOSIS — M7032 Other bursitis of elbow, left elbow: Secondary | ICD-10-CM | POA: Diagnosis not present

## 2022-05-09 DIAGNOSIS — Y939 Activity, unspecified: Secondary | ICD-10-CM | POA: Diagnosis not present

## 2022-05-16 ENCOUNTER — Other Ambulatory Visit: Payer: Self-pay | Admitting: Family Medicine

## 2022-05-16 ENCOUNTER — Telehealth: Payer: Self-pay | Admitting: Family Medicine

## 2022-05-16 DIAGNOSIS — K219 Gastro-esophageal reflux disease without esophagitis: Secondary | ICD-10-CM

## 2022-05-16 DIAGNOSIS — F331 Major depressive disorder, recurrent, moderate: Secondary | ICD-10-CM

## 2022-05-16 NOTE — Telephone Encounter (Signed)
  Prescription Request  05/16/2022  Is this a "Controlled Substance" medicine? no  Have you seen your PCP in the last 2 weeks? no  If YES, route message to pool  -  If NO, patient needs to be scheduled for appointment.  What is the name of the medication or equipment? Omeprazole 40 mg, Fluoxetine HCI 40 mg - Patient is out  Have you contacted your pharmacy to request a refill? YES   Which pharmacy would you like this sent to? [place]   Patient notified that their request is being sent to the clinical staff for review and that they should receive a response within 2 business days.

## 2022-05-16 NOTE — Telephone Encounter (Signed)
LMOVM refill sent back to pharmacy from electronic request

## 2022-06-06 NOTE — Patient Instructions (Addendum)
Our records indicate that you are due for your screening mammogram.  Please call the imaging center that does your yearly mammograms to make an appointment for a mammogram at your earliest convenience. Our office also has a mobile unit through the [facility] that comes to our location. Please call our office if you would like to make an appointment.   Lung Cancer Screening A lung cancer screening is a test that checks for lung cancer when there are no symptoms or history of that disease. The screening is done to look for lung cancer in its very early stages. Finding cancer early improves the chances of successful treatment. It may save your life. Who should have a screening? You should be screened for lung cancer if all of these apply: You currently smoke or you used to smoke. You are between the ages of 24 and 64 years old. Screening may be recommended up to age 69 depending on your overall health and other factors. You have a smoking history of 1 pack of cigarettes a day for 20 years or 2 packs a day for 10 years. How is screening done?  The recommended screening test is a low-dose computed tomography (LDCT) scan. This scan takes detailed images of the lungs. This allows a health care provider to look for abnormal cells. If you are at risk for lung cancer, it is recommended that you get screened once a year. Talk to your health care provider about the risks, benefits, and limitations of screening. What are the benefits of screening? Screening can find lung cancer early, before symptoms start and before it has spread outside of the lungs. The chances of curing lung cancer are greater if the cancer is diagnosed early. What are the risks of screening? The screening may show lung cancer when no cancer is present. Talk with your health care provider about what your results mean. In some cases, your health care provider may do more testing to confirm the results. The screening  may not find lung cancer when it is present. You will be exposed to radiation from repeated LDCT tests, which can cause cancer in otherwise healthy people. How can I lower my risk of lung cancer? Make these lifestyle changes to lower your risk of developing lung cancer: Do not use any products that contain nicotine or tobacco. These products include cigarettes, chewing tobacco, and vaping devices, such as e-cigarettes. If you need help quitting, ask your health care provider. Avoid secondhand smoke. Avoid exposure to radiation. Avoid exposure to radon gas. Have your home checked for radon regularly. Avoid things that cause cancer (carcinogens). Avoid living or working in places with high air pollution or diesel exhaust. Questions to ask your health care provider Am I eligible for lung cancer screening? Does my health insurance cover the cost of lung cancer screening? What happens if the lung cancer screening shows something of concern? How soon will I have results from my lung cancer screening? Is there anything that I need to do to prepare for my lung cancer screening? What happens if I decide not to have lung cancer screening? Where to find more information Ask your health care provider about the risks and benefits of screening. More information and resources are available from these organizations: American Cancer Society (ACS): cancer.org American Lung Association: lung.org National Cancer Institute: cancer.gov Contact a health care provider if: You start to show symptoms of lung cancer, including: A cough that will not go away. High-pitched whistling sounds  when you breathe, most often when you breathe out (wheezing). Chest pain. Coughing up blood. Shortness of breath. Weight loss that cannot be explained. Constant tiredness (fatigue). Hoarse voice. Summary Lung cancer screening may find lung cancer before symptoms appear. Finding cancer early improves the chances of successful  treatment. It may save your life. The recommended screening test is a low-dose computed tomography (LDCT) scan that looks for abnormal cells in the lungs. If you are at risk for lung cancer, it is recommended that you get screened once a year. You can make lifestyle changes to lower your risk of lung cancer. Ask your health care provider about the risks and benefits of screening. This information is not intended to replace advice given to you by your health care provider. Make sure you discuss any questions you have with your health care provider. Document Revised: 01/10/2022 Document Reviewed: 06/23/2020 Elsevier Patient Education  2024 Elsevier Inc. Coronary Calcium Scan A coronary calcium scan is an imaging test used to look for deposits of plaque in the inner lining of the blood vessels of the heart (coronary arteries). Plaque is made up of calcium, protein, and fatty substances. These deposits of plaque can partly clog and narrow the coronary arteries without producing any symptoms or warning signs. This puts a person at risk for a heart attack. A coronary calcium scan is performed using a computed tomography (CT) scanner machine without using a dye (contrast). This test is recommended for people who are at moderate risk for heart disease. The test can find plaque deposits before symptoms develop. Tell a health care provider about: Any allergies you have. All medicines you are taking, including vitamins, herbs, eye drops, creams, and over-the-counter medicines. Any problems you or family members have had with anesthetic medicines. Any bleeding problems you have. Any surgeries you have had. Any medical conditions you have. Whether you are pregnant or may be pregnant. What are the risks? Generally, this is a safe procedure. However, problems may occur, including: Harm to a pregnant woman and her unborn baby. This test involves the use of radiation. Radiation exposure can be dangerous to a  pregnant woman and her unborn baby. If you are pregnant or think you may be pregnant, you should not have this procedure done. A slight increase in the risk of cancer. This is because of the radiation involved in the test. The amount of radiation from one test is similar to the amount of radiation you are naturally exposed to over one year. What happens before the procedure? Ask your health care provider for any specific instructions on how to prepare for this procedure. You may be asked to avoid products that contain caffeine, tobacco, or nicotine for 4 hours before the procedure. What happens during the procedure?  You will undress and remove any jewelry from your neck or chest. You may need to remove hearing aides and dentures. Women may need to remove their bras. You will put on a hospital gown. Sticky electrodes will be placed on your chest. The electrodes will be connected to an electrocardiogram (ECG) machine to record a tracing of the electrical activity of your heart. You will lie down on your back on a curved bed that is attached to the CT scanner. You may be given medicine to slow down your heart rate so that clear pictures can be created. You will be moved into the CT scanner, and the CT scanner will take pictures of your heart. During this time, you will be asked  to lie still and hold your breath for 10-20 seconds at a time while each picture of your heart is being taken. The procedure may vary among health care providers and hospitals. What can I expect after the procedure? You can return to your normal activities. It is up to you to get the results of your procedure. Ask your health care provider, or the department that is doing the procedure, when your results will be ready. Summary A coronary calcium scan is an imaging test used to look for deposits of plaque in the inner lining of the blood vessels of the heart. Plaque is made up of calcium, protein, and fatty substances. A  coronary calcium scan is performed using a CT scanner machine without contrast. Generally, this is a safe procedure. Tell your health care provider if you are pregnant or may be pregnant. Ask your health care provider for any specific instructions on how to prepare for this procedure. You can return to your normal activities after the scan is done. This information is not intended to replace advice given to you by your health care provider. Make sure you discuss any questions you have with your health care provider. Document Revised: 12/12/2020 Document Reviewed: 12/12/2020 Elsevier Patient Education  2024 [employer].

## 2022-06-07 NOTE — Progress Notes (Signed)
Heather [name] is a 67 y.o. female presents to office today for annual physical exam examination.    Concerns today include: 1.  Hernia Patient thinks that she has a hernia and has an appointment to see someone to discuss this further.  She will be seen on 06/28/2022 with the PA at her gastroenterologist office and she is hopeful that they will perform endoscopy as she has been suffering with a lot of substernal chest and upper abdominal pain that occurs at random but sometimes wakes her up in the middle of the night.  She is compliant with her Prilosec 40 mg.  Denies any changes in activity tolerance.  No rectal bleeding.  She continues to smoke 1 pack/day and has done so for well over 30 years.  No hemoptysis, unplanned weight loss, night sweats, difficulty swallowing, new throat or ear pain.  Occupation: retired, Marital status: married, Substance use: tobacco Diet: typical [place] , Exercise: no structured Last colonoscopy: UTD Last mammogram: UTD Last pap smear: n/a Refills needed today: none Immunizations needed: Immunization History  Administered Date(s) Administered   Influenza,inj,Quad PF,6+ Mos 10/26/2014, 11/19/2017, 10/01/2018   Influenza-Unspecified 11/18/2019, 10/23/2020   Moderna Sars-Covid-2 Vaccination 04/17/2019, 05/16/2019, 11/18/2019   PNEUMOCOCCAL CONJUGATE-20 03/14/2021   Td 09/15/2010   Tdap 03/14/2021   Zoster Recombinat (Shingrix) 05/11/2016, 08/31/2016   Zoster, Live 05/12/2015     Past Medical History:  Diagnosis Date   Anxiety    Arthritis    Cataract    Depression    Fibromyalgia    GERD (gastroesophageal reflux disease)    Hyperlipidemia    no meds currently   Hypothyroidism    PONV (postoperative nausea and vomiting)    Thyroid disease    hypo   Social History   Socioeconomic History   Marital status: Married    Spouse name: Occupational hygienist   Number of children: 1   Years of education: Not on file   Highest education level: Not on file   Occupational History   Occupation: retired    Comment: Scientist, water quality and [employer]  Tobacco Use   Smoking status: Every Day    Packs/day: 1.00    Years: 25.00    Additional pack years: 0.00    Total pack years: 25.00    Types: Cigarettes   Smokeless tobacco: Never  Vaping Use   Vaping Use: Never used  Substance and Sexual Activity   Alcohol use: Never   Drug use: Never   Sexual activity: Not Currently    Birth control/protection: Surgical  Other Topics Concern   Not on file  Social History Narrative   Lives home with husband one level home   One son by first husband who lives in [place]   Social Determinants of Health   Financial Resource Strain: Low Risk  (03/13/2022)   Overall Financial Resource Strain (CARDIA)    Difficulty of Paying Living Expenses: Not hard at all  Food Insecurity: No Food Insecurity (03/13/2022)   Hunger Vital Sign    Worried About Running Out of Food in the Last Year: Never true    Ran Out of Food in the Last Year: Never true  Transportation Needs: No Transportation Needs (03/13/2022)   PRAPARE - Administrator, Civil Service (Medical): No    Lack of Transportation (Non-Medical): No  Physical Activity: Insufficiently Active (03/13/2022)   Exercise Vital Sign    Days of Exercise per Week: 3 days    Minutes of Exercise per Session: 30  min  Stress: No Stress Concern Present (03/13/2022)   [employer] of Occupational Health - Occupational Stress Questionnaire    Feeling of Stress : Not at all  Social Connections: Moderately Integrated (03/13/2022)   Social Connection and Isolation Panel [NHANES]    Frequency of Communication with Friends and Family: More than three times a week    Frequency of Social Gatherings with Friends and Family: Twice a week    Attends Religious Services: More than 4 times per year    Active Member of [employer] or Organizations: No    Attends Banker Meetings: Never    Marital Status: Married  Careers information officer Violence: Not At Risk (03/13/2022)   Humiliation, Afraid, Rape, and Kick questionnaire    Fear of Current or Ex-Partner: No    Emotionally Abused: No    Physically Abused: No    Sexually Abused: No   Past Surgical History:  Procedure Laterality Date   CATARACT EXTRACTION Left    CATARACT EXTRACTION W/PHACO Right 12/29/2013   Procedure: CATARACT EXTRACTION PHACO AND INTRAOCULAR LENS PLACEMENT (IOC);  Surgeon: Gemma Payor, MD;  Location: [facility];  Service: Ophthalmology;  Laterality: Right;  CDE:4.84   COLONOSCOPY     EYE SURGERY     cleaned debris from behind eye.   FOOT FRACTURE SURGERY Left 2017   heel   KNEE SURGERY Right 2018   torn meniscus   TUBAL LIGATION     tubal ligation reversal     VAGINAL HYSTERECTOMY     Family History  Problem Relation Age of Onset   Colon polyps Mother    Colon cancer Mother 96   Cancer Mother    Cancer - Colon Mother    Diabetes Father    Heart attack Father 44   Heart disease Father    Diabetes Sister    Leukemia Sister    Heart disease Sister    Acute lymphoblastic leukemia Sister    Diabetes Sister    Heart disease Sister    Thyroid disease Sister    Lung cancer Sister    Cancer - Lung Sister    Diabetes Brother    Heart attack Brother 49   Hyperlipidemia Brother    Diabetes Brother    Mental illness Brother    Graves' disease Brother    Heart disease Brother    CAD Brother    Diabetes Brother    CAD Brother    Heart disease Brother    Healthy Son    Rectal cancer Other    Colon cancer Other 48   Esophageal cancer Neg Hx    Stomach cancer Neg Hx    Breast cancer Neg Hx     Current Outpatient Medications:    atorvastatin (LIPITOR) 20 MG tablet, Take 1 tablet by mouth once daily, Disp: 90 tablet, Rfl: 0   busPIRone (BUSPAR) 30 MG tablet, Take 1/2 (one-half) tablet by mouth twice daily, Disp: 90 tablet, Rfl: 0   diclofenac Sodium (VOLTAREN) 1 % GEL, Apply 4 g topically 4 (four) times daily. For knee pain/ arthritis,  Disp: 400 g, Rfl: 0   FLUoxetine (PROZAC) 40 MG capsule, Take 1 capsule by mouth once daily, Disp: 90 capsule, Rfl: 0   levothyroxine (SYNTHROID) 112 MCG tablet, TAKE 1 TABLET BY MOUTH ONCE DAILY BEFORE BREAKFAST, Disp: 90 tablet, Rfl: 3   omeprazole (PRILOSEC) 40 MG capsule, Take 1 capsule by mouth once daily, Disp: 90 capsule, Rfl: 0   psyllium (REGULOID) 0.52 g  capsule, Take 0.52 g by mouth daily., Disp: , Rfl:    valACYclovir (VALTREX) 1000 MG tablet, TAKE 2 TABLETS BY MOUTH TWICE DAILY FOR 1 DAY AT FEVER BLISTER ONSET. Put on hold, Disp: 20 tablet, Rfl: 0  No Known Allergies   ROS: Review of Systems Pertinent items noted in HPI and remainder of comprehensive ROS otherwise negative.    Physical exam BP 129/70   Pulse 72   Temp 97.6 F (36.4 C) (Temporal)   Ht 5\' 4"  (1.626 m)   Wt 172 lb 12.8 oz (78.4 kg)   SpO2 95%   BMI 29.66 kg/m  General appearance: alert, cooperative, appears stated age, and no distress Head: Normocephalic, without obvious abnormality, atraumatic Eyes: conjunctivae/corneas clear. PERRL, EOM's intact. Fundi benign. Ears: normal TM's and external ear canals both ears Nose: Nares normal. Septum midline. Mucosa normal. No drainage or sinus tenderness. Throat: lips, mucosa, and tongue normal; teeth and gums normal Neck: no adenopathy, no carotid bruit, supple, symmetrical, trachea midline, and thyroid not enlarged, symmetric, no tenderness/mass/nodules Back: symmetric, no curvature. ROM normal. No CVA tenderness. Lungs: clear to auscultation bilaterally Heart: regular rate and rhythm, S1, S2 normal, no murmur, click, rub or gallop Abdomen: soft, non-tender; bowel sounds normal; no masses,  no organomegaly Extremities: extremities normal, atraumatic, no cyanosis or edema Pulses: 2+ and symmetric Skin: Skin color, texture, turgor normal. No rashes or lesions Lymph nodes: Cervical, supraclavicular, and axillary nodes normal. Neurologic: Grossly normal Psych:  Mood stable, speech normal, affect appropriate.  She is pleasant, interactive   Assessment/ Plan: [name] here for annual physical exam.   Annual physical exam  Hyperlipidemia with target LDL less than 100 - Plan: Lipid Panel, CMP14+EGFR, CT CARDIAC SCORING (SELF PAY ONLY)  Tobacco use - Plan: Ambulatory Referral Lung Cancer Screening [facility], CT CARDIAC SCORING (SELF PAY ONLY)  Family history of ischemic heart disease - Plan: CT CARDIAC SCORING (SELF PAY ONLY)  Hypothyroidism due to acquired atrophy of thyroid - Plan: TSH, T4, Free  Osteopenia after menopause - Plan: DG [facility] DEXA, CMP14+EGFR  Vitamin D deficiency - Plan: VITAMIN D 25 Hydroxy (Vit-D Deficiency, Fractures), DG [facility] DEXA  GAD (generalized anxiety disorder) - Plan: busPIRone (BUSPAR) 30 MG tablet  Moderate episode of recurrent major depressive disorder (HCC) - Plan: FLUoxetine (PROZAC) 40 MG capsule  Gastroesophageal reflux disease without esophagitis - Plan: CMP14+EGFR, omeprazole (PRILOSEC) 40 MG capsule  CT cardiac scoring ordered given family history of cardiovascular disease, patient's known hyperlipidemia and active tobacco use.  I will also send her for low-dose pulmonary screening given active smoking with greater than 30-pack-year history.  No red flag signs or symptoms  DEXA scan ordered given history of osteopenia.  Check renal function, calcium level and vitamin D  Anxiety is chronic and stable.  Buspirone helpful.  Continue fluoxetine.  PPI renewed.  Check renal function  Counseled on healthy lifestyle choices, including diet (rich in fruits, vegetables and lean meats and low in salt and simple carbohydrates) and exercise (at least 30 minutes of moderate physical activity daily).  Patient to follow up in 1 year for annual exam or sooner if needed.  Madelina Sanda M. Nadine Counts, DO

## 2022-06-09 ENCOUNTER — Ambulatory Visit (INDEPENDENT_AMBULATORY_CARE_PROVIDER_SITE_OTHER): Payer: Medicare HMO | Admitting: Family Medicine

## 2022-06-09 ENCOUNTER — Ambulatory Visit (INDEPENDENT_AMBULATORY_CARE_PROVIDER_SITE_OTHER): Payer: Medicare HMO

## 2022-06-09 ENCOUNTER — Encounter: Payer: Self-pay | Admitting: Family Medicine

## 2022-06-09 VITALS — BP 129/70 | HR 72 | Temp 97.6°F | Ht 64.0 in | Wt 172.8 lb

## 2022-06-09 DIAGNOSIS — F331 Major depressive disorder, recurrent, moderate: Secondary | ICD-10-CM | POA: Diagnosis not present

## 2022-06-09 DIAGNOSIS — M8589 Other specified disorders of bone density and structure, multiple sites: Secondary | ICD-10-CM | POA: Diagnosis not present

## 2022-06-09 DIAGNOSIS — Z8249 Family history of ischemic heart disease and other diseases of the circulatory system: Secondary | ICD-10-CM

## 2022-06-09 DIAGNOSIS — M8580 Other specified disorders of bone density and structure, unspecified site: Secondary | ICD-10-CM

## 2022-06-09 DIAGNOSIS — E034 Atrophy of thyroid (acquired): Secondary | ICD-10-CM

## 2022-06-09 DIAGNOSIS — E559 Vitamin D deficiency, unspecified: Secondary | ICD-10-CM

## 2022-06-09 DIAGNOSIS — F411 Generalized anxiety disorder: Secondary | ICD-10-CM | POA: Diagnosis not present

## 2022-06-09 DIAGNOSIS — Z0001 Encounter for general adult medical examination with abnormal findings: Secondary | ICD-10-CM

## 2022-06-09 DIAGNOSIS — Z0000 Encounter for general adult medical examination without abnormal findings: Secondary | ICD-10-CM

## 2022-06-09 DIAGNOSIS — Z780 Asymptomatic menopausal state: Secondary | ICD-10-CM

## 2022-06-09 DIAGNOSIS — M8588 Other specified disorders of bone density and structure, other site: Secondary | ICD-10-CM | POA: Diagnosis not present

## 2022-06-09 DIAGNOSIS — K219 Gastro-esophageal reflux disease without esophagitis: Secondary | ICD-10-CM | POA: Diagnosis not present

## 2022-06-09 DIAGNOSIS — Z720 Tobacco use: Secondary | ICD-10-CM | POA: Diagnosis not present

## 2022-06-09 DIAGNOSIS — E785 Hyperlipidemia, unspecified: Secondary | ICD-10-CM | POA: Diagnosis not present

## 2022-06-09 MED ORDER — ATORVASTATIN CALCIUM 20 MG PO TABS: 20.0000 mg | ORAL_TABLET | Freq: Every day | ORAL | 3 refills | Status: DC

## 2022-06-09 MED ORDER — FLUOXETINE HCL 40 MG PO CAPS: 40.0000 mg | ORAL_CAPSULE | Freq: Every day | ORAL | 3 refills | Status: DC

## 2022-06-09 MED ORDER — OMEPRAZOLE 40 MG PO CPDR: 40.0000 mg | DELAYED_RELEASE_CAPSULE | Freq: Every day | ORAL | 3 refills | Status: DC

## 2022-06-09 MED ORDER — BUSPIRONE HCL 30 MG PO TABS: ORAL_TABLET | ORAL | 3 refills | Status: DC

## 2022-06-09 MED ORDER — LEVOTHYROXINE SODIUM 112 MCG PO TABS: 112.0000 ug | ORAL_TABLET | Freq: Every day | ORAL | 3 refills | Status: DC

## 2022-06-10 LAB — LIPID PANEL
Chol/HDL Ratio: 3.4 ratio (ref 0.0–4.4)
Cholesterol, Total: 146 mg/dL (ref 100–199)
HDL: 43 mg/dL (ref >39)
LDL Chol Calc (NIH): 78 mg/dL (ref 0–99)
Triglycerides: 142 mg/dL (ref 0–149)
VLDL Cholesterol Cal: 25 mg/dL (ref 5–40)

## 2022-06-10 LAB — CMP14+EGFR
ALT: 12 IU/L (ref 0–32)
AST: 17 IU/L (ref 0–40)
Albumin/Globulin Ratio: 1.7 (ref 1.2–2.2)
Albumin: 4.4 g/dL (ref 3.9–4.9)
Alkaline Phosphatase: 92 IU/L (ref 44–121)
BUN/Creatinine Ratio: 17 (ref 12–28)
BUN: 10 mg/dL (ref 8–27)
Bilirubin Total: 0.4 mg/dL (ref 0.0–1.2)
CO2: 21 mmol/L (ref 20–29)
Calcium: 9.6 mg/dL (ref 8.7–10.3)
Chloride: 102 mmol/L (ref 96–106)
Creatinine, Ser: 0.58 mg/dL (ref 0.57–1.00)
Globulin, Total: 2.6 g/dL (ref 1.5–4.5)
Glucose: 93 mg/dL (ref 70–99)
Potassium: 4.4 mmol/L (ref 3.5–5.2)
Sodium: 138 mmol/L (ref 134–144)
Total Protein: 7 g/dL (ref 6.0–8.5)
eGFR: 99 mL/min/{1.73_m2} (ref >59)

## 2022-06-10 LAB — VITAMIN D 25 HYDROXY (VIT D DEFICIENCY, FRACTURES): Vit D, 25-Hydroxy: 24 ng/mL — ABNORMAL LOW (ref 30.0–100.0)

## 2022-06-10 LAB — TSH: TSH: 2.34 u[IU]/mL (ref 0.450–4.500)

## 2022-06-10 LAB — T4, FREE: Free T4: 1.21 ng/dL (ref 0.82–1.77)

## 2022-06-13 ENCOUNTER — Other Ambulatory Visit: Payer: Self-pay | Admitting: Family Medicine

## 2022-06-13 DIAGNOSIS — Z780 Asymptomatic menopausal state: Secondary | ICD-10-CM

## 2022-06-13 DIAGNOSIS — E559 Vitamin D deficiency, unspecified: Secondary | ICD-10-CM

## 2022-06-13 MED ORDER — VITAMIN D (ERGOCALCIFEROL) 1.25 MG (50000 UNIT) PO CAPS
50000.0000 [IU] | ORAL_CAPSULE | ORAL | 3 refills | Status: DC
Start: 2022-06-13 — End: 2023-06-15

## 2022-06-27 NOTE — Progress Notes (Unsigned)
06/28/2022 Heather [name] [id number] 10-30-1955  Referring provider: Raliegh Ip, DO Primary GI doctor: Dr. Adela Lank  ASSESSMENT AND PLAN:    Substernal chest pain intermittently for years, getting worse last several months though last episode April, substernal pain going straight through to her back lasting 1 to 5 hours worse with lying down. Patient is a smoker, strong family history of cardiac issues but has no exertional chest pain, shortness of breath, diaphoresis, nausea, vomiting. On exam patient did have an epigastric/right upper quadrant fullness, family history of cholecystectomy in mother and father Some concern for pancreatic or gallbladder origin with description going straight through a being worse with lying down Will get CT abdomen pelvis with contrast to evaluate pancreas, biliary ducts, gallbladder, recent normal LFTs. May need to consider right upper quadrant ultrasound or HIDA though not associate with food.  Discussed monitoring symptoms closely for any exertional symptoms and following up with primary care, has coronary calcium score scheduled in July for screening evaluation.  Esophageal dysphagia with GERD in current smoker Proceed with CT abdomen pelvis as described above Increase Protonix 40 mg from once daily to twice daily Smoking cessation discussed Will plan on EGD with possible dilation to evaluate for stenosis, malignancy, gastritis, esophagitis Consider barium swallow if negative  Current smoker Discussed risks associated with tobacco use and advised to quit. Information given to the patient, discuss with PCP.  Personal history of colonic polyps 09/01/2021 colonoscopy with Dr. Adela Lank adequate prep 1 diminutive polyp cecum, 1 polyp 3 to 4 mm ascending colon, 2 polyps 2 to 3 mm rectum diverticulosis sigmoid colon with luminal narrowing, internal hemorrhoids.  2 polyps were tubular adenomatous polyps, with family history and personal history  of numerous polyps in 2020 recall colonoscopy 5 years.  08/2026    Patient Care Team: Raliegh Ip, DO as PCP - [facility] (Ophthalmology) Michaelle Copas, MD as Referring Physician (Optometry) Armbruster, Willaim Rayas, MD as Consulting Physician (Gastroenterology)  HISTORY OF PRESENT ILLNESS: 67 y.o. female with a past medical history of personal history of adenomatous polyps, family history of colon cancer in mother age 90s, fibromyalgia, anxiety, GERD, hyperlipidemia, hypothyroidism and others listed below presents for evaluation of substernal chest pain  09/01/2021 colonoscopy with Dr. Adela Lank adequate prep 1 diminutive polyp cecum, 1 polyp 3 to 4 mm ascending colon, 2 polyps 2 to 3 mm rectum diverticulosis sigmoid colon with luminal narrowing, internal hemorrhoids.  2 polyps were tubular adenomatous polyps, with family history and personal history of numerous polyps in 2020 recall colonoscopy 5 years.  08/2026 09/20/2021 WBC shows no anemia, no leukocytosis normal platelets. 06/09/2022 unremarkable kidney function and liver function  She has been having substernal chest pain for years intermittently.  Has severe upper substernal pain with pain going straight through to her back.  Will be severe pain, lasts for 1-5 hours.  Can have associated diaphoresis. Denies nausea, vomiting.  She had her last episode in April, has not had another since that time.  She has been on omeprazole 40 mg in the morning for years, denies GERD.  Can have dysphagia with meats only, has noticed for a long time but is not consistent.  Worse with lying down in bed, has to stand and sit straight up.  She does house work and works out in the yard with flower beds, without any exertional SOB, nausea, discomfort.  She does smoke, 25 pack year smoking history, denies ETOH and drug  use. No NSAIDS.  She has history of cholesterol, heart disease in brother x 2 both with MI age in  21's, sister died 83 during open heart surgery, dad (died age 86). Mom and dad both with history of cholecystectomy  No melena, hematochezia.  No weight loss, fever, chills.   She  reports that she has been smoking cigarettes. She has a 25.00 pack-year smoking history. She has never used smokeless tobacco. She reports that she does not drink alcohol and does not use drugs.  RELEVANT LABS AND IMAGING: CBC    Component Value Date/Time   WBC 8.2 09/20/2021 1220   WBC 7.9 01/13/2014 1201   WBC 11.0 (H) 05/03/2006 0905   RBC 4.50 09/20/2021 1220   RBC 4.5 01/13/2014 1201   RBC 4.48 05/03/2006 0905   HGB 13.6 09/20/2021 1220   HCT 40.5 09/20/2021 1220   PLT 355 09/20/2021 1220   MCV 90 09/20/2021 1220   MCH 30.2 09/20/2021 1220   MCH 28.9 01/13/2014 1201   MCHC 33.6 09/20/2021 1220   MCHC 32.4 01/13/2014 1201   MCHC 35.2 05/03/2006 0905   RDW 13.0 09/20/2021 1220   LYMPHSABS 1.1 04/10/2017 1035   MONOABS 0.3 05/03/2006 0905   EOSABS 0.2 04/10/2017 1035   BASOSABS 0.0 04/10/2017 1035   Recent Labs    09/20/21 1220  HGB 13.6    CMP     Component Value Date/Time   NA 138 06/09/2022 0931   K 4.4 06/09/2022 0931   CL 102 06/09/2022 0931   CO2 21 06/09/2022 0931   GLUCOSE 93 06/09/2022 0931   GLUCOSE 92 12/24/2013 1301   BUN 10 06/09/2022 0931   CREATININE 0.58 06/09/2022 0931   CALCIUM 9.6 06/09/2022 0931   PROT 7.0 06/09/2022 0931   ALBUMIN 4.4 06/09/2022 0931   AST 17 06/09/2022 0931   ALT 12 06/09/2022 0931   ALKPHOS 92 06/09/2022 0931   BILITOT 0.4 06/09/2022 0931   GFRNONAA 91 01/21/2020 1615   GFRAA 105 01/21/2020 1615      Latest Ref Rng & Units 06/09/2022    9:31 AM 09/20/2021   12:20 PM 09/08/2020   10:31 AM  Hepatic Function  Total Protein 6.0 - 8.5 g/dL 7.0  6.9  6.9   Albumin 3.9 - 4.9 g/dL 4.4  4.4  4.5   AST 0 - 40 IU/L 17  14  15    ALT 0 - 32 IU/L 12  7  8    Alk Phosphatase 44 - 121 IU/L 92  93  93   Total Bilirubin 0.0 - 1.2 mg/dL 0.4  0.3  0.5        Current Medications:   Current Outpatient Medications (Endocrine & Metabolic):    levothyroxine (SYNTHROID) 112 MCG tablet, Take 1 tablet (112 mcg total) by mouth daily before breakfast.  Current Outpatient Medications (Cardiovascular):    atorvastatin (LIPITOR) 20 MG tablet, Take 1 tablet (20 mg total) by mouth daily.     Current Outpatient Medications (Other):    busPIRone (BUSPAR) 30 MG tablet, Take 1/2 (one-half) tablet by mouth twice daily   diclofenac Sodium (VOLTAREN) 1 % GEL, Apply 4 g topically 4 (four) times daily. For knee pain/ arthritis   FLUoxetine (PROZAC) 40 MG capsule, Take 1 capsule (40 mg total) by mouth daily.   omeprazole (PRILOSEC) 40 MG capsule, Take 1 capsule (40 mg total) by mouth daily.   psyllium (REGULOID) 0.52 g capsule, Take 0.52 g by mouth daily.   valACYclovir (VALTREX)  1000 MG tablet, TAKE 2 TABLETS BY MOUTH TWICE DAILY FOR 1 DAY AT FEVER BLISTER ONSET. Put on hold   Vitamin D, Ergocalciferol, (DRISDOL) 1.25 MG (50000 UNIT) CAPS capsule, Take 1 capsule (50,000 Units total) by mouth every 7 (seven) days.  Medical History:  Past Medical History:  Diagnosis Date   Anxiety    Arthritis    Cataract    Depression    Fibromyalgia    GERD (gastroesophageal reflux disease)    Hyperlipidemia    no meds currently   Hypothyroidism    PONV (postoperative nausea and vomiting)    Thyroid disease    hypo   Allergies: No Known Allergies   Surgical History:  She  has a past surgical history that includes Vaginal hysterectomy; Tubal ligation; tubal ligation reversal; Cataract extraction (Left); Eye surgery; Cataract extraction w/PHACO (Right, 12/29/2013); Knee surgery (Right, 2018); Foot fracture surgery (Left, 2017); and Colonoscopy. Family History:  Her family history includes Acute lymphoblastic leukemia in her sister; CAD in her brother and brother; Cancer in her mother; Cancer - Colon in her mother; Cancer - Lung in her sister; Colon cancer (age of  onset: 53) in an other family member; Colon cancer (age of onset: 81) in her mother; Colon polyps in her mother; Diabetes in her brother, brother, brother, father, sister, and sister; Luiz Blare' disease in her brother; Healthy in her son; Heart attack (age of onset: 87) in her brother and father; Heart disease in her brother, brother, father, sister, and sister; Hyperlipidemia in her brother; Leukemia in her sister; Lung cancer in her sister; Mental illness in her brother; Rectal cancer in an other family member; Thyroid disease in her sister.  REVIEW OF SYSTEMS  : All other systems reviewed and negative except where noted in the History of Present Illness.  PHYSICAL EXAM: BP 128/70 (BP Location: Left Arm, Patient Position: Sitting, Cuff Size: Normal)   Pulse 84   Ht 5\' 3"  (1.6 m)   Wt 171 lb (77.6 kg)   BMI 30.29 kg/m  General Appearance: Well nourished, in no apparent distress. Head:   Normocephalic and atraumatic. Poor dentition.  Eyes:  sclerae anicteric,conjunctive pink  Respiratory: Respiratory effort normal, BS equal bilaterally without rales, rhonchi, wheezing. Cardio: RRR with no MRGs. Peripheral pulses intact.  Abdomen: Soft,  Non-distended slight epigastric/RUQ fullness on palpation with mild tenderness,active bowel sounds.Without guarding and Without rebound.  Rectal: Not evaluated Musculoskeletal: Full ROM, Normal gait. Without edema. Skin:  Dry and intact without significant lesions or rashes Neuro: Alert and  oriented x4;  No focal deficits. Psych:  Cooperative. Normal mood and affect.    Doree Albee, PA-C 11:17 AM

## 2022-06-28 ENCOUNTER — Encounter: Payer: Self-pay | Admitting: Physician Assistant

## 2022-06-28 ENCOUNTER — Ambulatory Visit: Payer: Medicare HMO | Admitting: Physician Assistant

## 2022-06-28 VITALS — BP 128/70 | HR 84 | Ht 63.0 in | Wt 171.0 lb

## 2022-06-28 DIAGNOSIS — F17200 Nicotine dependence, unspecified, uncomplicated: Secondary | ICD-10-CM

## 2022-06-28 DIAGNOSIS — R072 Precordial pain: Secondary | ICD-10-CM

## 2022-06-28 DIAGNOSIS — Z86010 Personal history of colonic polyps: Secondary | ICD-10-CM

## 2022-06-28 DIAGNOSIS — K219 Gastro-esophageal reflux disease without esophagitis: Secondary | ICD-10-CM | POA: Diagnosis not present

## 2022-06-28 DIAGNOSIS — R1319 Other dysphagia: Secondary | ICD-10-CM

## 2022-06-28 DIAGNOSIS — R1906 Epigastric swelling, mass or lump: Secondary | ICD-10-CM | POA: Diagnosis not present

## 2022-06-28 NOTE — Patient Instructions (Signed)
Please take your proton pump inhibitor medication, increase the protonix  to twice a day Please take this medication 30 minutes to 1 hour before meals- this makes it more effective.  Avoid spicy and acidic foods Avoid fatty foods Limit your intake of coffee, tea, alcohol, and carbonated drinks Work to maintain a healthy weight Keep the head of the bed elevated at least 3 inches with blocks or a wedge pillow if you are having any nighttime symptoms Stay upright for 2 hours after eating Avoid meals and snacks three to four hours before bedtime Stop smoking  You will be contacted by [facility] Scheduling in the next 2 days to arrange a CT Abdomen/Pelvis  The number on your caller ID will be [phone], please answer when they call.  If you have not heard from them in 2 days please call [phone] to schedule.     _______________________________________________________  If your blood pressure at your visit was 140/90 or greater, please contact your primary care physician to follow up on this.  _______________________________________________________  If you are age 32 or older, your body mass index should be between 23-30. Your Body mass index is 30.29 kg/m. If this is out of the aforementioned range listed, please consider follow up with your Primary Care Provider.  If you are age 65 or younger, your body mass index should be between 19-25. Your Body mass index is 30.29 kg/m. If this is out of the aformentioned range listed, please consider follow up with your Primary Care Provider.   ________________________________________________________  The [place] [facility] providers would like to encourage you to use [facility] to communicate with providers for non-urgent requests or questions.  Due to long hold times on the telephone, sending your provider a message by [facility] may be a faster and more efficient way to get a response.  Please allow 48 business hours for a response.  Please remember  that this is for non-urgent requests.  _______________________________________________________   Due to recent changes in healthcare laws, you may see the results of your imaging and laboratory studies on MyChart before your provider has had a chance to review them.  We understand that in some cases there may be results that are confusing or concerning to you. Not all laboratory results come back in the same time frame and the provider may be waiting for multiple results in order to interpret others.  Please give [place] 48 hours in order for your provider to thoroughly review all the results before contacting the office for clarification of your results.

## 2022-06-28 NOTE — Progress Notes (Signed)
Agree with assessment plan as outlined.  Marchelle Folks I see that she is scheduled at the end of July for EGD, I have several openings sooner if she want to get exam done sooner for EGD.  Thanks

## 2022-07-13 ENCOUNTER — Ambulatory Visit: Payer: Medicare HMO | Admitting: Gastroenterology

## 2022-07-18 ENCOUNTER — Ambulatory Visit (HOSPITAL_COMMUNITY)
Admission: RE | Admit: 2022-07-18 | Discharge: 2022-07-18 | Disposition: A | Discharge status: Home / Self Care | Payer: Medicare HMO | Source: Ambulatory Visit | Attending: Physician Assistant | Admitting: Physician Assistant

## 2022-07-18 ENCOUNTER — Encounter (HOSPITAL_COMMUNITY): Payer: Self-pay

## 2022-07-18 DIAGNOSIS — K760 Fatty (change of) liver, not elsewhere classified: Secondary | ICD-10-CM | POA: Diagnosis not present

## 2022-07-18 DIAGNOSIS — I700 Atherosclerosis of aorta: Secondary | ICD-10-CM | POA: Diagnosis not present

## 2022-07-18 DIAGNOSIS — K8020 Calculus of gallbladder without cholecystitis without obstruction: Secondary | ICD-10-CM | POA: Insufficient documentation

## 2022-07-18 DIAGNOSIS — R109 Unspecified abdominal pain: Secondary | ICD-10-CM | POA: Diagnosis not present

## 2022-07-18 DIAGNOSIS — K449 Diaphragmatic hernia without obstruction or gangrene: Secondary | ICD-10-CM | POA: Diagnosis not present

## 2022-07-18 DIAGNOSIS — K429 Umbilical hernia without obstruction or gangrene: Secondary | ICD-10-CM | POA: Insufficient documentation

## 2022-07-18 DIAGNOSIS — I7143 Infrarenal abdominal aortic aneurysm, without rupture: Secondary | ICD-10-CM | POA: Diagnosis not present

## 2022-07-18 DIAGNOSIS — R072 Precordial pain: Secondary | ICD-10-CM | POA: Insufficient documentation

## 2022-07-18 MED ORDER — SODIUM CHLORIDE (PF) 0.9 % IJ SOLN
INTRAMUSCULAR | Status: AC
  Filled 2022-07-18: qty 50

## 2022-07-18 MED ORDER — IOHEXOL 300 MG/ML  SOLN
100.0000 mL | Freq: Once | INTRAMUSCULAR | Status: AC | PRN
  Administered 2022-07-18: 100 mL via INTRAVENOUS

## 2022-07-19 ENCOUNTER — Telehealth: Payer: Self-pay | Admitting: Physician Assistant

## 2022-07-19 NOTE — Telephone Encounter (Signed)
Pt states her OV note from her last visit with Marchelle Folks mentioned increasing Protonix 40mg  to twice a day. Pt states she is taking omeprazole and she wanted to make sure she was not supposed to be on protonix. Please advise.

## 2022-07-19 NOTE — Telephone Encounter (Signed)
Sent patient message via MyChart

## 2022-07-19 NOTE — Telephone Encounter (Signed)
Inbound call from patient stating that she was looking over her after visit summery from 6/12 with Quentin Mulling. Patient stated that she notice that it says take protonix to twice a day and she doesn't take that. Patient is requesting a call back to discuss. Please advise.

## 2022-07-21 ENCOUNTER — Other Ambulatory Visit: Payer: Self-pay | Admitting: *Deleted

## 2022-07-21 DIAGNOSIS — K8020 Calculus of gallbladder without cholecystitis without obstruction: Secondary | ICD-10-CM

## 2022-07-21 NOTE — Telephone Encounter (Signed)
Patient returned the call and explained to the patient, the endoscopy is scheduled due to the difficulty with swallowing, the HIDA is a scan that is ordered to check gallbladder functions. Patient states she will continue with endoscopy schedule and the HIDA scan was ordered.

## 2022-07-21 NOTE — Telephone Encounter (Signed)
Patient states after speaking with Marchelle Folks and was informed if something showed up after the CT of A/P  W contrast, she may not have to have the endoscopy. She is questioning if it is necessary to have the Endoscopy procedure. Informed the patient, Marchelle Folks is out of office today and when she returns on Monday, she will be notified what would be the better route. Patient does state that she continues to have pain after eating, sometimes severe and sometimes mild discomfort. Patient called back to discuss information regarding the Endoscopy in relation to dysphagia. LMTCB.

## 2022-07-21 NOTE — Telephone Encounter (Signed)
Inbound call from patient in regards to ct results? Patient is also inquiring if she needs to continue with upcoming procedure. Please advise.  Thank you

## 2022-07-26 ENCOUNTER — Telehealth: Payer: Self-pay | Admitting: Family Medicine

## 2022-07-26 NOTE — Telephone Encounter (Signed)
Patient aware and verbalized understanding. °

## 2022-07-26 NOTE — Telephone Encounter (Signed)
Yes I did review the CAT scan.  Her provider did copy it to me.  The major findings were slight dilation of the aorta and we will follow this up with ultrasound in a few years.  I agree with them emphasizing cholesterol, blood pressure, sugar control and avoidance of tobacco use to reduce risk of this becoming larger.  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  This is common for her age but again all the more reason to continue cholesterol control with her atorvastatin to reduce ongoing buildup of this.

## 2022-07-26 NOTE — Telephone Encounter (Signed)
Patient has concern a about abdomen CT wants you to look at heart finding and see what you think?

## 2022-08-03 ENCOUNTER — Ambulatory Visit (HOSPITAL_COMMUNITY)
Admission: RE | Admit: 2022-08-03 | Discharge: 2022-08-03 | Disposition: A | Discharge status: Home / Self Care | Payer: Medicare HMO | Source: Ambulatory Visit | Attending: Family Medicine | Admitting: Family Medicine

## 2022-08-03 DIAGNOSIS — Z720 Tobacco use: Secondary | ICD-10-CM

## 2022-08-03 DIAGNOSIS — E785 Hyperlipidemia, unspecified: Secondary | ICD-10-CM

## 2022-08-03 DIAGNOSIS — Z8249 Family history of ischemic heart disease and other diseases of the circulatory system: Secondary | ICD-10-CM

## 2022-08-04 NOTE — Progress Notes (Signed)
Patient r/c  

## 2022-08-09 ENCOUNTER — Ambulatory Visit (AMBULATORY_SURGERY_CENTER): Payer: Medicare HMO | Admitting: Gastroenterology

## 2022-08-09 ENCOUNTER — Encounter: Payer: Self-pay | Admitting: Gastroenterology

## 2022-08-09 VITALS — BP 144/57 | HR 66 | Temp 97.5°F | Resp 12 | Ht 63.0 in | Wt 171.0 lb

## 2022-08-09 DIAGNOSIS — R0789 Other chest pain: Secondary | ICD-10-CM

## 2022-08-09 DIAGNOSIS — K219 Gastro-esophageal reflux disease without esophagitis: Secondary | ICD-10-CM | POA: Diagnosis not present

## 2022-08-09 DIAGNOSIS — K319 Disease of stomach and duodenum, unspecified: Secondary | ICD-10-CM | POA: Diagnosis not present

## 2022-08-09 DIAGNOSIS — F419 Anxiety disorder, unspecified: Secondary | ICD-10-CM | POA: Diagnosis not present

## 2022-08-09 DIAGNOSIS — R1310 Dysphagia, unspecified: Secondary | ICD-10-CM | POA: Diagnosis not present

## 2022-08-09 DIAGNOSIS — M797 Fibromyalgia: Secondary | ICD-10-CM | POA: Diagnosis not present

## 2022-08-09 DIAGNOSIS — R1319 Other dysphagia: Secondary | ICD-10-CM

## 2022-08-09 DIAGNOSIS — F32A Depression, unspecified: Secondary | ICD-10-CM | POA: Diagnosis not present

## 2022-08-09 DIAGNOSIS — E039 Hypothyroidism, unspecified: Secondary | ICD-10-CM | POA: Diagnosis not present

## 2022-08-09 MED ORDER — SODIUM CHLORIDE 0.9 % IV SOLN
500.0000 mL | INTRAVENOUS | Status: DC
Start: 2022-08-09 — End: 2022-08-09

## 2022-08-09 NOTE — Progress Notes (Signed)
Called to room to assist during endoscopic procedure.  Patient ID and intended procedure confirmed with present staff. Received instructions for my participation in the procedure from the performing physician.  

## 2022-08-09 NOTE — Patient Instructions (Signed)
Await pathology results.  Resume previous diet. Continue present medications.  YOU HAD AN ENDOSCOPIC PROCEDURE TODAY AT THE [facility]:   Refer to the procedure report that was given to you for any specific questions about what was found during the examination.  If the procedure report does not answer your questions, please call your gastroenterologist to clarify.  If you requested that your care partner not be given the details of your procedure findings, then the procedure report has been included in a sealed envelope for you to review at your convenience later.  YOU SHOULD EXPECT: Some feelings of bloating in the abdomen. Passage of more gas than usual.  Walking can help get rid of the air that was put into your GI tract during the procedure and reduce the bloating. If you had a lower endoscopy (such as a colonoscopy or flexible sigmoidoscopy) you may notice spotting of blood in your stool or on the toilet paper. If you underwent a bowel prep for your procedure, you may not have a normal bowel movement for a few days.  Please Note:  You might notice some irritation and congestion in your nose or some drainage.  This is from the oxygen used during your procedure.  There is no need for concern and it should clear up in a day or so.  SYMPTOMS TO REPORT IMMEDIATELY:  Following upper endoscopy (EGD)  Vomiting of blood or coffee ground material  New chest pain or pain under the shoulder blades  Painful or persistently difficult swallowing  New shortness of breath  Fever of 100F or higher  Black, tarry-looking stools  For urgent or emergent issues, a gastroenterologist can be reached at any hour by calling [phone]. Do not use MyChart messaging for urgent concerns.    DIET:  We do recommend a small meal at first, but then you may proceed to your regular diet.  Drink plenty of fluids but you should avoid alcoholic beverages for 24 hours.  ACTIVITY:  You should plan to take  it easy for the rest of today and you should NOT DRIVE or use heavy machinery until tomorrow (because of the sedation medicines used during the test).    FOLLOW UP: Our staff will call the number listed on your records the next business day following your procedure.  We will call around 7:15- 8:00 am to check on you and address any questions or concerns that you may have regarding the information given to you following your procedure. If we do not reach you, we will leave a message.     If any biopsies were taken you will be contacted by phone or by letter within the next 1-3 weeks.  Please call us at [phone] if you have not heard about the biopsies in 3 weeks.    SIGNATURES/CONFIDENTIALITY: You and/or your care partner have signed paperwork which will be entered into your electronic medical record.  These signatures attest to the fact that that the information above on your After Visit Summary has been reviewed and is understood.  Full responsibility of the confidentiality of this discharge information lies with you and/or your care-partner.

## 2022-08-09 NOTE — Op Note (Signed)
[facility] Patient Name: Heather [name] Procedure Date: 08/09/2022 11:44 AM MRN: [id number] Endoscopist: Viviann Spare P. Adela Lank , MD, [id number] Age: 67 Referring MD:  Date of Birth: 26-Apr-1955 Gender: Female Account #: [id number] Procedure:                Upper GI endoscopy Indications:              Dysphagia - mild intermittent, history of                            gastro-esophageal reflux disease, atypical chest                            pain (occurs at night when lying down) - some                            improvement with increasing omeprazole to twice                            daily dosing. CT shows gallstones. Medicines:                Monitored Anesthesia Care Procedure:                Pre-Anesthesia Assessment:                           - Prior to the procedure, a History and Physical                            was performed, and patient medications and                            allergies were reviewed. The patient's tolerance of                            previous anesthesia was also reviewed. The risks                            and benefits of the procedure and the sedation                            options and risks were discussed with the patient.                            All questions were answered, and informed consent                            was obtained. Prior Anticoagulants: The patient has                            taken no anticoagulant or antiplatelet agents. ASA                            Grade Assessment: II - A patient with mild systemic  disease. After reviewing the risks and benefits,                            the patient was deemed in satisfactory condition to                            undergo the procedure.                           After obtaining informed consent, the endoscope was                            passed under direct vision. Throughout the                            procedure, the patient's blood  pressure, pulse, and                            oxygen saturations were monitored continuously. The                            GIF [id number] #[id number] was introduced through the                            mouth, and advanced to the second part of duodenum.                            The upper GI endoscopy was accomplished without                            difficulty. The patient tolerated the procedure                            well. Scope In: Scope Out: Findings:                 Esophagogastric landmarks were identified: the                            Z-line was found at 37 cm, the gastroesophageal                            junction was found at 37 cm and the upper extent of                            the gastric folds was found at 39 cm from the                            incisors.                           A 2 cm hiatal hernia was present.                           The Z-line was slightly irregular but  did not meet                            criteria for Barrett's.                           The exam of the esophagus was otherwise normal. No                            focal stenosis / stricture. Patient did not want                            empiric dilation when discussing this option with                            her pre-procedure, dysphagia is mild.                           Biopsies were obtained from the proximal and distal                            esophagus with cold forceps to rule out                            eosinophilic esophagitis.                           Patchy mildly erythematous mucosa was found in the                            gastric antrum.                           The exam of the stomach was otherwise normal.                           Biopsies were taken with a cold forceps for                            Helicobacter pylori testing.                           The examined duodenum was normal. Complications:            No immediate complications. Estimated  blood loss:                            Minimal. Estimated Blood Loss:     Estimated blood loss was minimal. Impression:               - Esophagogastric landmarks identified.                           - 2 cm hiatal hernia.                           - Z-line slightly irregular but did not  meet                            criteria for Barrett's.                           - Normal esophagus otherwise - biopsies taken to                            rule out EoE                           - Erythematous mucosa in the antrum.                           - Normal stomach otherwise - biopsies taken to rule                            out H pylori                           - Normal examined duodenum.                           No erosive changes but increasing omeprazole to                            twice daily dosing has helped her since office                            visit. Would continue that dosing for now while we                            await pathology results. Recommendation:           - Patient has a contact number available for                            emergencies. The signs and symptoms of potential                            delayed complications were discussed with the                            patient. Return to normal activities tomorrow.                            Written discharge instructions were provided to the                            patient.                           - Resume previous diet.                           - Continue present medications.                           -  Await pathology results with further                            recommendations. Gallstones noted but symptoms                            atypical for biliary colic based on how she                            describes symptoms today Viviann Spare P. Kevon Tench, MD 08/09/2022 12:05:41 PM This report has been signed electronically.

## 2022-08-09 NOTE — Progress Notes (Signed)
Sedate, gd SR, tolerated procedure well, VSS, report to RN 

## 2022-08-09 NOTE — Progress Notes (Signed)
Pt's states no medical or surgical changes since previsit or office visit. 

## 2022-08-09 NOTE — Progress Notes (Signed)
[facility] History and Physical   Primary Care Physician:  Raliegh Ip, DO   Reason for Procedure:   Dysphagia. GERD  Plan:    EGD with possible dilation     HPI: Heather [name] is a 67 y.o. female  here for EGD to evaluate dysphagia - no prior exams. Atypical chest pain ongoing for some time, on omeprazole 40mg  / day for GERD longstanding. Intermittent solid food dysphagia. CT scan shows gallstones. Dysphagia she states is mild. Symptoms occur at night when she is lying down. Increased omeprazole to BID dosing and this has helped since her last visit. She denies any exertional symptoms. Otherwise feels well without any cardiopulmonary symptoms.  EGD to further evaluate.  I have discussed risks / benefits of anesthesia and endoscopic procedure with [name] and they wish to proceed with the exams as outlined today.    Past Medical History:  Diagnosis Date   Anxiety    Arthritis    Cataract    Depression    Fibromyalgia    GERD (gastroesophageal reflux disease)    Hyperlipidemia    no meds currently   Hypothyroidism    PONV (postoperative nausea and vomiting)    Thyroid disease    hypo    Past Surgical History:  Procedure Laterality Date   CATARACT EXTRACTION Left    CATARACT EXTRACTION W/PHACO Right 12/29/2013   Procedure: CATARACT EXTRACTION PHACO AND INTRAOCULAR LENS PLACEMENT (IOC);  Surgeon: Gemma Payor, MD;  Location: [facility];  Service: Ophthalmology;  Laterality: Right;  CDE:4.84   COLONOSCOPY     EYE SURGERY     cleaned debris from behind eye.   FOOT FRACTURE SURGERY Left 2017   heel   KNEE SURGERY Right 2018   torn meniscus   TUBAL LIGATION     tubal ligation reversal     VAGINAL HYSTERECTOMY      Prior to Admission medications   Medication Sig Start Date End Date Taking? Authorizing Provider  atorvastatin (LIPITOR) 20 MG tablet Take 1 tablet (20 mg total) by mouth daily. 06/09/22  Yes Gottschalk, Kathie Rhodes M, DO  busPIRone (BUSPAR) 30  MG tablet Take 1/2 (one-half) tablet by mouth twice daily 06/09/22  Yes Gottschalk, Ashly M, DO  diclofenac Sodium (VOLTAREN) 1 % GEL Apply 4 g topically 4 (four) times daily. For knee pain/ arthritis 03/12/20  Yes Delynn Flavin M, DO  FLUoxetine (PROZAC) 40 MG capsule Take 1 capsule (40 mg total) by mouth daily. 06/09/22  Yes Delynn Flavin M, DO  levothyroxine (SYNTHROID) 112 MCG tablet Take 1 tablet (112 mcg total) by mouth daily before breakfast. 06/09/22  Yes Gottschalk, Ashly M, DO  omeprazole (PRILOSEC) 40 MG capsule Take 1 capsule (40 mg total) by mouth daily. 06/09/22  Yes Gottschalk, Ashly M, DO  psyllium (REGULOID) 0.52 g capsule Take 0.52 g by mouth daily.   Yes [provider]  Vitamin D, Ergocalciferol, (DRISDOL) 1.25 MG (50000 UNIT) CAPS capsule Take 1 capsule (50,000 Units total) by mouth every 7 (seven) days. 06/13/22  Yes Gottschalk, Ashly M, DO  valACYclovir (VALTREX) 1000 MG tablet TAKE 2 TABLETS BY MOUTH TWICE DAILY FOR 1 DAY AT FEVER BLISTER ONSET. Put on hold 03/14/21   Raliegh Ip, DO    Current Outpatient Medications  Medication Sig Dispense Refill   atorvastatin (LIPITOR) 20 MG tablet Take 1 tablet (20 mg total) by mouth daily. 100 tablet 3   busPIRone (BUSPAR) 30 MG tablet Take 1/2 (one-half) tablet by  mouth twice daily 100 tablet 3   diclofenac Sodium (VOLTAREN) 1 % GEL Apply 4 g topically 4 (four) times daily. For knee pain/ arthritis 400 g 0   FLUoxetine (PROZAC) 40 MG capsule Take 1 capsule (40 mg total) by mouth daily. 100 capsule 3   levothyroxine (SYNTHROID) 112 MCG tablet Take 1 tablet (112 mcg total) by mouth daily before breakfast. 100 tablet 3   omeprazole (PRILOSEC) 40 MG capsule Take 1 capsule (40 mg total) by mouth daily. 100 capsule 3   psyllium (REGULOID) 0.52 g capsule Take 0.52 g by mouth daily.     Vitamin D, Ergocalciferol, (DRISDOL) 1.25 MG (50000 UNIT) CAPS capsule Take 1 capsule (50,000 Units total) by mouth every 7 (seven) days.  12 capsule 3   valACYclovir (VALTREX) 1000 MG tablet TAKE 2 TABLETS BY MOUTH TWICE DAILY FOR 1 DAY AT FEVER BLISTER ONSET. Put on hold 20 tablet 0   Current Facility-Administered Medications  Medication Dose Route Frequency Provider Last Rate Last Admin   0.9 %  sodium chloride infusion  500 mL Intravenous Continuous Benno Brensinger, Willaim Rayas, MD        Allergies as of 08/09/2022   (No Known Allergies)    Family History  Problem Relation Age of Onset   Colon polyps Mother    Colon cancer Mother 76   Cancer Mother    Cancer - Colon Mother    Diabetes Father    Heart attack Father 45   Heart disease Father    Diabetes Sister    Leukemia Sister    Heart disease Sister    Acute lymphoblastic leukemia Sister    Diabetes Sister    Heart disease Sister    Thyroid disease Sister    Lung cancer Sister    Cancer - Lung Sister    Diabetes Brother    Heart attack Brother 31   Hyperlipidemia Brother    Diabetes Brother    Mental illness Brother    Graves' disease Brother    Heart disease Brother    CAD Brother    Diabetes Brother    CAD Brother    Heart disease Brother    Healthy Son    Rectal cancer Other    Colon cancer Other 48   Esophageal cancer Neg Hx    Stomach cancer Neg Hx    Breast cancer Neg Hx     Social History   Socioeconomic History   Marital status: Married    Spouse name: Quinton   Number of children: 1   Years of education: Not on file   Highest education level: Not on file  Occupational History   Occupation: retired    Comment: Scientist, water quality and [employer]  Tobacco Use   Smoking status: Every Day    Current packs/day: 1.00    Average packs/day: 1 pack/day for 25.0 years (25.0 ttl pk-yrs)    Types: Cigarettes   Smokeless tobacco: Never  Vaping Use   Vaping status: Never Used  Substance and Sexual Activity   Alcohol use: Never   Drug use: Never   Sexual activity: Not Currently    Birth control/protection: Surgical  Other Topics Concern   Not on  file  Social History Narrative   Lives home with husband one level home   One son by first husband who lives in [place]   Social Determinants of Health   Financial Resource Strain: Low Risk  (03/13/2022)   Overall Financial Resource Strain (CARDIA)    Difficulty  of Paying Living Expenses: Not hard at all  Food Insecurity: No Food Insecurity (03/13/2022)   Hunger Vital Sign    Worried About Running Out of Food in the Last Year: Never true    Ran Out of Food in the Last Year: Never true  Transportation Needs: No Transportation Needs (03/13/2022)   PRAPARE - Administrator, Civil Service (Medical): No    Lack of Transportation (Non-Medical): No  Physical Activity: Insufficiently Active (03/13/2022)   Exercise Vital Sign    Days of Exercise per Week: 3 days    Minutes of Exercise per Session: 30 min  Stress: No Stress Concern Present (03/13/2022)   [employer] of Occupational Health - Occupational Stress Questionnaire    Feeling of Stress : Not at all  Social Connections: Moderately Integrated (03/13/2022)   Social Connection and Isolation Panel [NHANES]    Frequency of Communication with Friends and Family: More than three times a week    Frequency of Social Gatherings with Friends and Family: Twice a week    Attends Religious Services: More than 4 times per year    Active Member of [employer] or Organizations: No    Attends Banker Meetings: Never    Marital Status: Married  Catering manager Violence: Not At Risk (03/13/2022)   Humiliation, Afraid, Rape, and Kick questionnaire    Fear of Current or Ex-Partner: No    Emotionally Abused: No    Physically Abused: No    Sexually Abused: No    Review of Systems: All other review of systems negative except as mentioned in the HPI.  Physical Exam: Vital signs BP (!) 153/76   Pulse 71   Temp (!) 97.5 F (36.4 C)   Ht 5\' 3"  (1.6 m)   Wt 171 lb (77.6 kg)   SpO2 98%   BMI 30.29 kg/m   General:   Alert,   Well-developed, pleasant and cooperative in NAD Lungs:  Clear throughout to auscultation.   Heart:  Regular rate and rhythm Abdomen:  Soft, nontender and nondistended.   Neuro/Psych:  Alert and cooperative. Normal mood and affect. A and O x 3  Harlin Rain, MD [facility]

## 2022-08-10 ENCOUNTER — Telehealth: Payer: Self-pay

## 2022-08-10 NOTE — Telephone Encounter (Signed)
  Follow up Call-     08/09/2022   11:03 AM 09/01/2021    7:11 AM  Call back number  Post procedure Call Back phone  # [phone]  Permission to leave phone message Yes Yes     Patient questions:  Do you have a fever, pain , or abdominal swelling? No. Pain Score  0 *  Have you tolerated food without any problems? Yes.    Have you been able to return to your normal activities? Yes.    Do you have any questions about your discharge instructions: Diet   No. Medications  No. Follow up visit  No.  Do you have questions or concerns about your Care? No.  Actions: * If pain score is 4 or above: No action needed, pain <4.

## 2022-08-15 ENCOUNTER — Telehealth: Payer: Self-pay | Admitting: Family Medicine

## 2022-08-15 DIAGNOSIS — Z8249 Family history of ischemic heart disease and other diseases of the circulatory system: Secondary | ICD-10-CM

## 2022-08-15 DIAGNOSIS — R931 Abnormal findings on diagnostic imaging of heart and coronary circulation: Secondary | ICD-10-CM

## 2022-08-15 DIAGNOSIS — E785 Hyperlipidemia, unspecified: Secondary | ICD-10-CM

## 2022-08-15 NOTE — Telephone Encounter (Signed)
done

## 2022-08-18 ENCOUNTER — Ambulatory Visit (INDEPENDENT_AMBULATORY_CARE_PROVIDER_SITE_OTHER): Payer: Medicare HMO | Admitting: Cardiology

## 2022-08-18 ENCOUNTER — Encounter (HOSPITAL_BASED_OUTPATIENT_CLINIC_OR_DEPARTMENT_OTHER): Payer: Self-pay | Admitting: Cardiology

## 2022-08-18 VITALS — BP 128/88 | HR 74 | Ht 63.0 in | Wt 172.6 lb

## 2022-08-18 DIAGNOSIS — Z712 Person consulting for explanation of examination or test findings: Secondary | ICD-10-CM | POA: Diagnosis not present

## 2022-08-18 DIAGNOSIS — Z720 Tobacco use: Secondary | ICD-10-CM

## 2022-08-18 DIAGNOSIS — E7800 Pure hypercholesterolemia, unspecified: Secondary | ICD-10-CM | POA: Diagnosis not present

## 2022-08-18 DIAGNOSIS — I2510 Atherosclerotic heart disease of native coronary artery without angina pectoris: Secondary | ICD-10-CM

## 2022-08-18 DIAGNOSIS — Z7189 Other specified counseling: Secondary | ICD-10-CM | POA: Diagnosis not present

## 2022-08-18 MED ORDER — ASPIRIN 81 MG PO TBEC: 81.0000 mg | DELAYED_RELEASE_TABLET | Freq: Every day | ORAL | Status: AC

## 2022-08-18 MED ORDER — ATORVASTATIN CALCIUM 40 MG PO TABS
40.0000 mg | ORAL_TABLET | Freq: Every day | ORAL | 3 refills | Status: DC
Start: 2022-08-18 — End: 2023-06-15

## 2022-08-18 NOTE — Patient Instructions (Signed)
Medication Instructions:  Increase Atorvastatin to 40 mg daily Start Aspirin 81 mg daily  *If you need a refill on your cardiac medications before your next appointment, please call your pharmacy*   Lab Work: Labwork with Primary care in the fall    Testing/Procedures: None   Follow-Up: At [facility], you and your health needs are our priority.  As part of our continuing mission to provide you with exceptional heart care, we have created designated Provider Care Teams.  These Care Teams include your primary Cardiologist (physician) and Advanced Practice Providers (APPs -  Physician Assistants and Nurse Practitioners) who all work together to provide you with the care you need, when you need it.  We recommend signing up for the patient portal called "MyChart".  Sign up information is provided on this After Visit Summary.  MyChart is used to connect with patients for Virtual Visits (Telemedicine).  Patients are able to view lab/test results, encounter notes, upcoming appointments, etc.  Non-urgent messages can be sent to your provider as well.   To learn more about what you can do with MyChart, go to [email or web address].    Your next appointment:   1 year(s)  Provider:   Jodelle Red, MD     Other Instructions None

## 2022-08-18 NOTE — Progress Notes (Signed)
Cardiology Office Note:  .    Date:  08/18/2022  ID:  Heather [name], DOB [birth year], MRN [id number] PCP: Raliegh Ip, DO  [facility] Providers Cardiologist:  None     History of Present Illness: .    Heather [name] is a 67 y.o. female with a hx of CAD, hyperlipidemia, hypothyroidism, GERD, osteopenia, tobacco use, anxiety, depression, here for the evaluation of elevated coronary artery calcium score.  Seen by Dr. Nadine Counts on 06/09/2022 for annual physical exam. She complained of a lot of substernal chest and upper abdominal pain that she thought may be due to a hernia. Compliant with her Prilosec 40 mg. She continued to smoke 1 pack/day; 30+ year smoking history. Noted to have a family history of ischemic heart disease. Given her risk factors she underwent calcium scoring 08/03/2022 revealing a coronary calcium score of 165 (LAD 51.8, LCx 61.9, RCA 51.1).   Cardiovascular risk factors: Prior clinical ASCVD: CAD per calcium score 07/2022. Known hyperlipidemia prior to that. Comorbid conditions: Hyperlipidemia - On atorvastatin 20 mg daily, has been on this for a while and tolerates it well.  Metabolic syndrome/Obesity:  Highest adult weight 180 lbs. Current weight 172 lbs. Chronic inflammatory conditions: None. Tobacco use history: Current smoker; since she was 67 yo. Has been smoking about 30 years, consistently less than 1 ppd, she has been cutting back. Family history: Her father died at age 102 with massive heart attack. Her sister had CABG at 91 yo, needed valve replacement, had congenital heart defect found late in life. Her other sister had atrial fibrillation and a pacemaker. Her brother died at 86 yo. Prior pertinent testing and/or incidental findings: Coronary CT 07/2022 with calcium score 165. Exercise level: Housework. Lives with husband, son, and grandson. Occasional left-sided pains with exertion, quick duration. She also has other sporadically intermittent  chest/abdominal pain that always occurs at night. This is a more severe pain she describes as "pressing into the bone with your knuckles" and can last anywhere from 1-5 hours. Most of the time she needs to get up out of bed to offer some relief of her symptoms. She reports that she completed GI workup including endoscopy that was unremarkable and CT imaging which was notable for gallstones. It has been recommended that she increase her omeprazole to 2 tablets. Current diet: Enjoys vegetables, grows her own in the garden.  She denies any palpitations, shortness of breath, peripheral edema, lightheadedness, headaches, syncope, orthopnea, or PND.  ROS:  Please see the history of present illness. ROS otherwise negative except as noted.  (+) Left-sided chest pains (+) Substernal/abdominal pain  Studies Reviewed: Marland Kitchen    EKG Interpretation Date/Time:  Friday August 18 2022 13:41:47 EDT Ventricular Rate:  74 PR Interval:  146 QRS Duration:  82 QT Interval:  386 QTC Calculation: 428 R Axis:   56  Text Interpretation: Normal sinus rhythm Normal ECG When compared with ECG of 24-Dec-2013 13:01, No significant change was found Confirmed by Jodelle Red [phone]) on 08/18/2022 1:49:31 PM    CT Cardiac Scoring  08/03/2022: FINDINGS: Left main: 0   Left anterior descending artery: 51.8   Left circumflex artery: 61.9   Right coronary artery: 51.1   IMPRESSION:  Coronary calcium score of 165. This was 84% percentile for age-,  race-, and sex-matched controls.     Physical Exam:    VS:  BP 128/88 (BP Location: Left Arm, Patient Position: Sitting, Cuff Size: Normal)   Pulse 74  Ht 5\' 3"  (1.6 m)   Wt 172 lb 9.6 oz (78.3 kg)   BMI 30.57 kg/m    Wt Readings from Last 3 Encounters:  08/18/22 172 lb 9.6 oz (78.3 kg)  08/09/22 171 lb (77.6 kg)  06/28/22 171 lb (77.6 kg)    GEN: Well nourished, well developed in no acute distress HEENT: Normal, moist mucous membranes NECK: No  JVD CARDIAC: regular rhythm, normal S1 and S2, no rubs or gallops. No murmur. VASCULAR: Radial and DP pulses 2+ bilaterally. No carotid bruits RESPIRATORY:  Clear to auscultation without rales, wheezing or rhonchi  ABDOMEN: Soft, non-tender, non-distended MUSCULOSKELETAL:  Ambulates independently SKIN: Warm and dry, no edema NEUROLOGIC:  Alert and oriented x 3. No focal neuro deficits noted. PSYCHIATRIC:  Normal affect   ASSESSMENT AND PLAN: .    Nonobstructive CAD Hypercholesterolemia -reviewed calcium score results today, discussed guideline recommendations -after shared decision making, increase atorvastatin to 40 mg daily, start aspirin 81 mg daily -due for labs with PCP in the fall, LDL goal <70 -reviewed red flag warning signs that need immediate medical attention  Tobacco use: counseled on cessation  CV risk counseling and prevention -recommend heart healthy/Mediterranean diet, with whole grains, fruits, vegetable, fish, lean meats, nuts, and olive oil. Limit salt. -recommend moderate walking, 3-5 times/week for 30-50 minutes each session. Aim for at least 150 minutes.week. Goal should be pace of 3 miles/hours, or walking 1.5 miles in 30 minutes -recommend avoidance of tobacco products. Avoid excess alcohol.   Dispo: Follow-up in 1 year, or sooner as needed.  I,Mathew Stumpf,acting as a Neurosurgeon for [employer], MD.,have documented all relevant documentation on the behalf of Jodelle Red, MD,as directed by  Jodelle Red, MD while in the presence of Jodelle Red, MD.  I, Jodelle Red, MD, have reviewed all documentation for this visit. The documentation on 10/04/22 for the exam, diagnosis, procedures, and orders are all accurate and complete.   Signed, Jodelle Red, MD

## 2022-08-30 ENCOUNTER — Other Ambulatory Visit: Payer: Self-pay | Admitting: Family Medicine

## 2022-08-30 DIAGNOSIS — B0089 Other herpesviral infection: Secondary | ICD-10-CM

## 2022-09-01 ENCOUNTER — Other Ambulatory Visit: Payer: Self-pay

## 2022-09-01 DIAGNOSIS — F17210 Nicotine dependence, cigarettes, uncomplicated: Secondary | ICD-10-CM

## 2022-09-01 DIAGNOSIS — Z122 Encounter for screening for malignant neoplasm of respiratory organs: Secondary | ICD-10-CM

## 2022-09-01 DIAGNOSIS — Z87891 Personal history of nicotine dependence: Secondary | ICD-10-CM

## 2022-10-03 ENCOUNTER — Ambulatory Visit (INDEPENDENT_AMBULATORY_CARE_PROVIDER_SITE_OTHER): Payer: Medicare HMO | Admitting: Acute Care

## 2022-10-03 ENCOUNTER — Encounter: Payer: Self-pay | Admitting: Acute Care

## 2022-10-03 DIAGNOSIS — F17210 Nicotine dependence, cigarettes, uncomplicated: Secondary | ICD-10-CM

## 2022-10-03 NOTE — Patient Instructions (Signed)
Thank you for participating in the [facility]. It was our pleasure to meet you today. We will call you with the results of your scan within the next few days. Your scan will be assigned a Lung RADS category score by the physicians reading the scans.  This Lung RADS score determines follow up scanning.  See below for description of categories, and follow up screening recommendations. We will be in touch to schedule your follow up screening annually or based on recommendations of our providers. We will fax a copy of your scan results to your Primary Care Physician, or the physician who referred you to the program, to ensure they have the results. Please call the office if you have any questions or concerns regarding your scanning experience or results.  Our office number is [phone]. Please speak with Abigail Miyamoto, RN., Karlton Lemon RN, or Pietro Cassis RN. They are  our Lung Cancer Screening RN.'s If They are unavailable when you call, Please leave a message on the voice mail. We will return your call at our earliest convenience.This voice mail is monitored several times a day.  Remember, if your scan is normal, we will scan you annually as long as you continue to meet the criteria for the program. (Age 69-80, Current smoker or smoker who has quit within the last 15 years). If you are a smoker, remember, quitting is the single most powerful action that you can take to decrease your risk of lung cancer and other pulmonary, breathing related problems. We know quitting is hard, and we are here to help.  Please let us know if there is anything we can do to help you meet your goal of quitting. If you are a former smoker, Counselling psychologist. We are proud of you! Remain smoke free! Remember you can refer friends or family members through the number above.  We will screen them to make sure they meet criteria for the program. Thank you for helping [place] take better care of you  by participating in Lung Screening.   Lung RADS Categories:  Lung RADS 1: no nodules or definitely non-concerning nodules.  Recommendation is for a repeat annual scan in 12 months.  Lung RADS 2:  nodules that are non-concerning in appearance and behavior with a very low likelihood of becoming an active cancer. Recommendation is for a repeat annual scan in 12 months.  Lung RADS 3: nodules that are probably non-concerning , includes nodules with a low likelihood of becoming an active cancer.  Recommendation is for a 50-month repeat screening scan. Often noted after an upper respiratory illness. We will be in touch to make sure you have no questions, and to schedule your 33-month scan.  Lung RADS 4 A: nodules with concerning findings, recommendation is most often for a follow up scan in 3 months or additional testing based on our provider's assessment of the scan. We will be in touch to make sure you have no questions and to schedule the recommended 3 month follow up scan.  Lung RADS 4 B:  indicates findings that are concerning. We will be in touch with you to schedule additional diagnostic testing based on our provider's  assessment of the scan.  You can receive free nicotine replacement therapy ( patches, gum or mints) by calling 1-800-QUIT NOW. Please call so we can get you on the path to becoming  a non-smoker. I know it is hard, but you can do this!  Other options for assistance in  smoking cessation ( As covered by your insurance benefits)  Hypnosis for smoking cessation  [employer] [phone]  Acupuncture for smoking cessation  [employer] [phone]

## 2022-10-03 NOTE — Progress Notes (Signed)
Virtual Visit via Telephone Note  I connected with Heather [name] on 10/03/22 at  2:00 PM EDT by telephone and verified that I am speaking with the correct person using two identifiers.  Location: Patient:  At home Provider: [address], [place], [place], [address]    I discussed the limitations, risks, security and privacy concerns of performing an evaluation and management service by telephone and the availability of in person appointments. I also discussed with the patient that there may be a patient responsible charge related to this service. The patient expressed understanding and agreed to proceed.   Shared Decision Making Visit Lung Cancer Screening Program [phone])   Eligibility: Age 67 y.o. Pack Years Smoking History Calculation 39 pack year smoking history (# packs/per year x # years smoked) Recent History of coughing up blood  no Unexplained weight loss? no ( >Than 15 pounds within the last 6 months ) Prior History Lung / other cancer no (Diagnosis within the last 5 years already requiring surveillance chest CT Scans). Smoking Status Current Smoker Former Smokers: Years since quit:  NA  Quit Date:  NA  Visit Components: Discussion included one or more decision making aids. yes Discussion included risk/benefits of screening. yes Discussion included potential follow up diagnostic testing for abnormal scans. yes Discussion included meaning and risk of over diagnosis. yes Discussion included meaning and risk of False Positives. yes Discussion included meaning of total radiation exposure. yes  Counseling Included: Importance of adherence to annual lung cancer LDCT screening. yes Impact of comorbidities on ability to participate in the program. yes Ability and willingness to under diagnostic treatment. yes  Smoking Cessation Counseling: Current Smokers:  Discussed importance of smoking cessation. yes Information about tobacco cessation classes and interventions  provided to patient. yes Patient provided with "ticket" for LDCT Scan. yes Symptomatic Patient. no  Counseling NA Diagnosis Code: Tobacco Use Z72.0 Asymptomatic Patient yes  Counseling (Intermediate counseling: > three minutes counseling) U0454 Former Smokers:  Discussed the importance of maintaining cigarette abstinence. yes Diagnosis Code: Personal History of Nicotine Dependence. U98.119 Information about tobacco cessation classes and interventions provided to patient. Yes Patient provided with "ticket" for LDCT Scan. yes Written Order for Lung Cancer Screening with LDCT placed in Epic. Yes (CT Chest Lung Cancer Screening Low Dose W/O CM) [id number] Z12.2-Screening of respiratory organs Z87.891-Personal history of nicotine dependence  I have spent 25 minutes of face to face/ virtual visit   time with  Heather [name] discussing the risks and benefits of lung cancer screening. We viewed / discussed a power point together that explained in detail the above noted topics. We paused at intervals to allow for questions to be asked and answered to ensure understanding.We discussed that the single most powerful action that she can take to decrease her risk of developing lung cancer is to quit smoking. We discussed whether or not she is ready to commit to setting a quit date. We discussed options for tools to aid in quitting smoking including nicotine replacement therapy, non-nicotine medications, support groups, Quit Smart classes, and behavior modification. We discussed that often times setting smaller, more achievable goals, such as eliminating 1 cigarette a day for a week and then 2 cigarettes a day for a week can be helpful in slowly decreasing the number of cigarettes smoked. This allows for a sense of accomplishment as well as providing a clinical benefit. I provided  her  with smoking cessation  information  with contact information for community resources, classes,  free nicotine replacement therapy, and  access to mobile apps, text messaging, and on-line smoking cessation help. I have also provided  her  the office contact information in the event she needs to contact me, or the screening staff. We discussed the time and location of the scan, and that either Abigail Miyamoto RN, Karlton Lemon, RN  or I will call / send a letter with the results within 24-72 hours of receiving them. The patient verbalized understanding of all of  the above and had no further questions upon leaving the office. They have my contact information in the event they have any further questions.  I spent 3-4 minutes counseling on smoking cessation and the health risks of continued tobacco abuse.  I explained to the patient that there has been a high incidence of coronary artery disease noted on these exams. I explained that this is a non-gated exam therefore degree or severity cannot be determined. This patient is on statin therapy. I have asked the patient to follow-up with their PCP regarding any incidental finding of coronary artery disease and management with diet or medication as their PCP  feels is clinically indicated. The patient verbalized understanding of the above and had no further questions upon completion of the visit.      Bevelyn Ngo, NP 10/03/2022

## 2022-10-04 ENCOUNTER — Encounter (HOSPITAL_BASED_OUTPATIENT_CLINIC_OR_DEPARTMENT_OTHER): Payer: Self-pay | Admitting: Cardiology

## 2022-10-05 ENCOUNTER — Ambulatory Visit (HOSPITAL_COMMUNITY): Payer: Medicare HMO

## 2022-12-18 ENCOUNTER — Ambulatory Visit (HOSPITAL_COMMUNITY)
Admission: RE | Admit: 2022-12-18 | Discharge: 2022-12-18 | Disposition: A | Discharge status: Home / Self Care | Payer: Medicare HMO | Source: Ambulatory Visit | Attending: Physician Assistant | Admitting: Physician Assistant

## 2022-12-18 DIAGNOSIS — K8020 Calculus of gallbladder without cholecystitis without obstruction: Secondary | ICD-10-CM | POA: Diagnosis not present

## 2022-12-18 DIAGNOSIS — R1013 Epigastric pain: Secondary | ICD-10-CM | POA: Diagnosis not present

## 2022-12-18 MED ORDER — SINCALIDE 5 MCG IJ SOLR
INTRAMUSCULAR | Status: AC
  Filled 2022-12-18: qty 5

## 2022-12-18 MED ORDER — STERILE WATER FOR INJECTION IJ SOLN
INTRAMUSCULAR | Status: AC
  Filled 2022-12-18: qty 10

## 2022-12-18 MED ORDER — TECHNETIUM TC 99M MEBROFENIN IV KIT
4.9000 | PACK | Freq: Once | INTRAVENOUS | Status: AC | PRN
  Administered 2022-12-18: 4.9 via INTRAVENOUS

## 2023-01-04 ENCOUNTER — Other Ambulatory Visit: Payer: Self-pay | Admitting: Family Medicine

## 2023-01-04 DIAGNOSIS — Z1231 Encounter for screening mammogram for malignant neoplasm of breast: Secondary | ICD-10-CM

## 2023-01-05 ENCOUNTER — Ambulatory Visit
Admission: RE | Admit: 2023-01-05 | Discharge: 2023-01-05 | Disposition: A | Discharge status: Home / Self Care | Payer: Medicare HMO | Source: Ambulatory Visit | Attending: Family Medicine | Admitting: Family Medicine

## 2023-01-05 DIAGNOSIS — Z1231 Encounter for screening mammogram for malignant neoplasm of breast: Secondary | ICD-10-CM | POA: Diagnosis not present

## 2023-01-18 DIAGNOSIS — K8010 Calculus of gallbladder with chronic cholecystitis without obstruction: Secondary | ICD-10-CM | POA: Diagnosis not present

## 2023-01-18 DIAGNOSIS — I2510 Atherosclerotic heart disease of native coronary artery without angina pectoris: Secondary | ICD-10-CM | POA: Diagnosis not present

## 2023-01-18 DIAGNOSIS — Z720 Tobacco use: Secondary | ICD-10-CM | POA: Diagnosis not present

## 2023-01-19 ENCOUNTER — Telehealth: Payer: Self-pay

## 2023-01-19 NOTE — Telephone Encounter (Signed)
   Pre-operative Risk Assessment    Patient Name: Heather [name]  DOB: January 25, 1955 MRN: [id number]   Date of last office visit: 08/18/2022 Date of next office visit: Not scheduled   Request for Surgical Clearance    Procedure:   Cholesystectomy  Date of Surgery:  Clearance TBD                                Surgeon:  Herlene Bureau, MD Surgeon's Group or Practice Name:  [facility] Phone number:  [phone] Fax number:  [phone]   Type of Clearance Requested:   - Medical  - Pharmacy:  Hold Aspirin      Type of Anesthesia:  General    Additional requests/questions:    Bonney Ival LOISE Gerome   01/19/2023, 7:48 AM

## 2023-01-19 NOTE — Telephone Encounter (Signed)
 1st attempt to reach pt regarding surgical clearance and the need for an TELE appointment.  Left pt a detailed message to call back and get that scheduled.

## 2023-01-19 NOTE — Telephone Encounter (Signed)
 Primary Cardiologist:Bridgette Lonni, MD   Preoperative team, please contact this patient and set up a phone call appointment for further preoperative risk assessment. Please obtain consent and complete medication review. Thank you for your help.   Per office protocol and pending no concerning cardiac symptoms at time of visit, she may hold aspirin  for 5-7 days prior to procedure and should resume as soon as hemodynamically stable postoperatively.  I also confirmed the patient resides in the state of [place] . As per [facility] Medical Board telemedicine laws, the patient must reside in the state in which the provider is licensed.   Heather EMERSON Bane, NP-C  01/19/2023, 8:41 AM [address] Office [phone] Fax [phone]

## 2023-01-22 ENCOUNTER — Telehealth: Payer: Self-pay

## 2023-01-22 NOTE — Telephone Encounter (Signed)
 Preop televisit now scheduled, med rec and consent done.

## 2023-01-22 NOTE — Telephone Encounter (Signed)
  Patient Consent for Virtual Visit        Heather [name] has provided verbal consent on 01/22/2023 for a virtual visit (video or telephone).   CONSENT FOR VIRTUAL VISIT FOR:  Heather [name]  By participating in this virtual visit I agree to the following:  I hereby voluntarily request, consent and authorize [facility] and its employed or contracted physicians, physician assistants, nurse practitioners or other licensed health care professionals (the Practitioner), to provide me with telemedicine health care services (the "Services) as deemed necessary by the treating Practitioner. I acknowledge and consent to receive the Services by the Practitioner via telemedicine. I understand that the telemedicine visit will involve communicating with the Practitioner through live audiovisual communication technology and the disclosure of certain medical information by electronic transmission. I acknowledge that I have been given the opportunity to request an in-person assessment or other available alternative prior to the telemedicine visit and am voluntarily participating in the telemedicine visit.  I understand that I have the right to withhold or withdraw my consent to the use of telemedicine in the course of my care at any time, without affecting my right to future care or treatment, and that the Practitioner or I may terminate the telemedicine visit at any time. I understand that I have the right to inspect all information obtained and/or recorded in the course of the telemedicine visit and may receive copies of available information for a reasonable fee.  I understand that some of the potential risks of receiving the Services via telemedicine include:  Delay or interruption in medical evaluation due to technological equipment failure or disruption; Information transmitted may not be sufficient (e.g. poor resolution of images) to allow for appropriate medical decision making by the Practitioner;  and/or  In rare instances, security protocols could fail, causing a breach of personal health information.  Furthermore, I acknowledge that it is my responsibility to provide information about my medical history, conditions and care that is complete and accurate to the best of my ability. I acknowledge that Practitioner's advice, recommendations, and/or decision may be based on factors not within their control, such as incomplete or inaccurate data provided by me or distortions of diagnostic images or specimens that may result from electronic transmissions. I understand that the practice of medicine is not an exact science and that Practitioner makes no warranties or guarantees regarding treatment outcomes. I acknowledge that a copy of this consent can be made available to me via my patient portal [facility] MyChart), or I can request a printed copy by calling the office of [facility].    I understand that my insurance will be billed for this visit.   I have read or had this consent read to me. I understand the contents of this consent, which adequately explains the benefits and risks of the Services being provided via telemedicine.  I have been provided ample opportunity to ask questions regarding this consent and the Services and have had my questions answered to my satisfaction. I give my informed consent for the services to be provided through the use of telemedicine in my medical care

## 2023-02-12 ENCOUNTER — Ambulatory Visit: Payer: Medicare HMO | Attending: Physician Assistant | Admitting: Physician Assistant

## 2023-02-12 ENCOUNTER — Encounter: Payer: Self-pay | Admitting: Physician Assistant

## 2023-02-12 DIAGNOSIS — Z01810 Encounter for preprocedural cardiovascular examination: Secondary | ICD-10-CM

## 2023-02-12 NOTE — Progress Notes (Signed)
   Virtual Visit via Telephone Note   Because of Heather [name]'s co-morbid illnesses, she is at least at moderate risk for complications without adequate follow up.  This format is felt to be most appropriate for this patient at this time.  The patient did not have access to video technology/had technical difficulties with video requiring transitioning to audio format only (telephone).  All issues noted in this document were discussed and addressed.  No physical exam could be performed with this format.  Please refer to the patient's chart for her consent to telehealth for [facility].  Evaluation Performed:  Preoperative cardiovascular risk assessment _____________   Date:  02/12/2023   Patient ID:  Heather, [name] 10/08/1955, MRN [id number] Patient Location:  Home Provider location:   Office  Primary Care Provider:  Raliegh Ip, DO Primary Cardiologist:  Jodelle Red, MD  Chief Complaint / Patient Profile   68 y.o. y/o female with a h/o    Coronary artery calcification CAC score 08/03/2022: 165 (84th percentile Hyperlipidemia Tobacco use Family history of CAD  who is pending cholecystectomy with Dr. Sheliah Hatch under general anesthesia and presents today for telephonic preoperative cardiovascular risk assessment.    History of Present Illness    Heather [name] is a 68 y.o. female who presents via audio/video conferencing for a telehealth visit today.  Pt was last seen in cardiology clinic on 08/18/2022 by Dr. Cristal Deer.  At that time [name] was doing well.  The patient is now pending procedure as outlined above. Since her last visit, she has done well without chest pain or shortness of breath.  She has no limitations in her activity.    Physical Exam    Vital Signs:  Heather [name] does not have vital signs available for review today.  Given telephonic nature of communication, physical exam is limited. AAOx3. NAD. Normal affect.  Speech and  respirations are unlabored.  Accessory Clinical Findings    None    Assessment & Plan    Assessment & Plan Preoperative cardiovascular examination Ms. Brenneman's perioperative risk of a major cardiac event is 0.9% according to the Revised Cardiac Risk Index (RCRI).  Therefore, she is at low risk for perioperative complications.   Her functional capacity is good at 4.31 METs according to the Duke Activity Status Index (DASI). Recommendations: According to ACC/AHA guidelines, no further cardiovascular testing needed.  The patient may proceed to surgery at acceptable risk.   Antiplatelet and/or Anticoagulation Recommendations: Aspirin can be held for 7 days prior to her surgery.  Please resume Aspirin post operatively when it is felt to be safe from a bleeding standpoint.   A copy of this note will be routed to requesting surgeon.  Time:   Today, I have spent 4 minutes with the patient with telehealth technology discussing medical history, symptoms, and management plan.     Tereso Newcomer, PA-C  02/12/2023, 8:34 PM

## 2023-02-16 NOTE — Progress Notes (Signed)
[facility] pre op orders for PST appointment 02/19/23.

## 2023-02-16 NOTE — Progress Notes (Signed)
COVID Vaccine received:  []  No [x]  Yes Date of any COVID positive Test in last 90 days: no PCP - Delynn Flavin DO Cardiologist - Jodelle Red MD  Cardiac clearance 02/12/23 Tereso Newcomer PA-C  Chest x-ray -  EKG -  08/18/22 Epic Stress Test -  ECHO -  Cardiac Cath -   Bowel Prep - [x]  No  []   Yes ______  Pacemaker / ICD device [x]  No []  Yes   Spinal Cord Stimulator:[x]  No []  Yes       History of Sleep Apnea? [x]  No []  Yes   CPAP used?- [x]  No []  Yes    Does the patient monitor blood sugar?          [x]  No []  Yes  []  N/A  Patient has: [x]  NO Hx DM   []  Pre-DM                 []  DM1  []   DM2 Does patient have a [employer] or Dexacom? []  No []  Yes   Fasting Blood Sugar Ranges-  Checks Blood Sugar _____ times a day  GLP1 agonist / usual dose - no GLP1 instructions:  SGLT-2 inhibitors / usual dose - no SGLT-2 instructions:   Blood Thinner / Instructions:no Aspirin Instructions:ASA 81mg  Last dose 02/18/23  Comments:   Activity level: Patient is able  to climb a flight of stairs without difficulty; [x]  No CP  [x]  No SOB,    Patient can perform ADLs without assistance.   Anesthesia review:   Patient denies shortness of breath, fever, cough and chest pain at PAT appointment.  Patient verbalized understanding and agreement to the Pre-Surgical Instructions that were given to them at this PAT appointment. Patient was also educated of the need to review these PAT instructions again prior to his/her surgery.I reviewed the appropriate phone numbers to call if they have any and questions or concerns.

## 2023-02-16 NOTE — Patient Instructions (Signed)
SURGICAL WAITING ROOM VISITATION  Patients having surgery or a procedure may have no more than 2 support people in the waiting area - these visitors may rotate.    Children under the age of 81 must have an adult with them who is not the patient.  Due to an increase in RSV and influenza rates and associated hospitalizations, children ages 49 and under may not visit patients in [facility] hospitals.  Visitors with respiratory illnesses are discouraged from visiting and should remain at home.  If the patient needs to stay at the hospital during part of their recovery, the visitor guidelines for inpatient rooms apply. Pre-op nurse will coordinate an appropriate time for 1 support person to accompany patient in pre-op.  This support person may not rotate.    Please refer to the [facility] website for the visitor guidelines for Inpatients (after your surgery is over and you are in a regular room).       Your procedure is scheduled on: 02/26/23   Report to [facility] Main Entrance    Report to admitting at 7:30 AM   Call this number if you have problems the morning of surgery [phone]   Do not eat food :After Midnight.   After Midnight you may have the following liquids until ______ AM/ PM DAY OF SURGERY  Water Non-Citrus Juices (without pulp, NO RED-Apple, White grape, White cranberry) Black Coffee (NO MILK/CREAM OR CREAMERS, sugar ok)  Clear Tea (NO MILK/CREAM OR CREAMERS, sugar ok) regular and decaf                             Plain Jell-O (NO RED)                                           Fruit ices (not with fruit pulp, NO RED)                                     Popsicles (NO RED)                                                               Sports drinks like Gatorade (NO RED)                The day of surgery:  Drink ONE (1) Pre-Surgery Clear Ensure AM the morning of surgery. Drink in one sitting. Do not sip.  This drink was given to you during your hospital   pre-op appointment visit. Nothing else to drink after completing the  Pre-Surgery Clear Ensure           If you have questions, please contact your surgeon's office.     Oral Hygiene is also important to reduce your risk of infection.                                    Remember - BRUSH YOUR TEETH THE MORNING OF SURGERY WITH YOUR REGULAR TOOTHPASTE  DENTURES WILL BE  REMOVED PRIOR TO SURGERY PLEASE DO NOT APPLY "Poly grip" OR ADHESIVES!!!   Do NOT smoke after Midnight   Stop all vitamins and herbal supplements 7 days before surgery.   Take these medicines the morning of surgery with A SIP OF WATER: atorvastatin, buspar, prozac, synthroid, omeprazole             You may not have any metal on your body including hair pins, jewelry, and body piercing             Do not wear make-up, lotions, powders, perfumes/cologne, or deodorant  Do not wear nail polish including gel and S&S, artificial/acrylic nails, or any other type of covering on natural nails including finger and toenails. If you have artificial nails, gel coating, etc. that needs to be removed by a nail salon please have this removed prior to surgery or surgery may need to be canceled/ delayed if the surgeon/ anesthesia feels like they are unable to be safely monitored.   Do not shave  48 hours prior to surgery.    Do not bring valuables to the hospital. [facility] IS NOT             RESPONSIBLE   FOR VALUABLES.   Contacts, glasses, dentures or bridgework may not be worn into surgery.  DO NOT BRING YOUR HOME MEDICATIONS TO THE HOSPITAL. PHARMACY WILL DISPENSE MEDICATIONS LISTED ON YOUR MEDICATION LIST TO YOU DURING YOUR ADMISSION IN THE HOSPITAL!    Patients discharged on the day of surgery will not be allowed to drive home.  Someone NEEDS to stay with you for the first 24 hours after anesthesia.   Special Instructions: Bring a copy of your healthcare power of attorney and living will documents the day of surgery if you  haven't scanned them before.              Please read over the following fact sheets you were given: IF YOU HAVE QUESTIONS ABOUT YOUR PRE-OP INSTRUCTIONS PLEASE CALL [phone] Rosey [place]   If you received a COVID test during your pre-op visit  it is requested that you wear a mask when out in public, stay away from anyone that may not be feeling well and notify your surgeon if you develop symptoms. If you test positive for Covid or have been in contact with anyone that has tested positive in the last 10 days please notify you surgeon.    [facility] - Preparing for Surgery Before surgery, you can play an important role.  Because skin is not sterile, your skin needs to be as free of germs as possible.  You can reduce the number of germs on your skin by washing with CHG (chlorahexidine gluconate) soap before surgery.  CHG is an antiseptic cleaner which kills germs and bonds with the skin to continue killing germs even after washing. Please DO NOT use if you have an allergy to CHG or antibacterial soaps.  If your skin becomes reddened/irritated stop using the CHG and inform your nurse when you arrive at Short Stay. Do not shave (including legs and underarms) for at least 48 hours prior to the first CHG shower.  You may shave your face/neck.  Please follow these instructions carefully:  1.  Shower with CHG Soap the night before surgery and the  morning of surgery.  2.  If you choose to wash your hair, wash your hair first as usual with your normal  shampoo.  3.  After you shampoo, rinse your hair  and body thoroughly to remove the shampoo.                             4.  Use CHG as you would any other liquid soap.  You can apply chg directly to the skin and wash.  Gently with a scrungie or clean washcloth.  5.  Apply the CHG Soap to your body ONLY FROM THE NECK DOWN.   Do   not use on face/ open                           Wound or open sores. Avoid contact with eyes, ears mouth and   genitals (private  parts).                       Wash face,  Genitals (private parts) with your normal soap.             6.  Wash thoroughly, paying special attention to the area where your    surgery  will be performed.  7.  Thoroughly rinse your body with warm water from the neck down.  8.  DO NOT shower/wash with your normal soap after using and rinsing off the CHG Soap.                9.  Pat yourself dry with a clean towel.            10.  Wear clean pajamas.            11.  Place clean sheets on your bed the night of your first shower and do not  sleep with pets. Day of Surgery : Do not apply any lotions/deodorants the morning of surgery.  Please wear clean clothes to the hospital/surgery center.  FAILURE TO FOLLOW THESE INSTRUCTIONS MAY RESULT IN THE CANCELLATION OF YOUR SURGERY  PATIENT SIGNATURE_________________________________  NURSE SIGNATURE__________________________________  ________________________________________________________________________

## 2023-02-17 ENCOUNTER — Ambulatory Visit: Payer: Self-pay | Admitting: General Surgery

## 2023-02-19 ENCOUNTER — Encounter (HOSPITAL_COMMUNITY)
Admission: RE | Admit: 2023-02-19 | Discharge: 2023-02-19 | Disposition: A | Discharge status: Home / Self Care | Payer: Medicare HMO | Source: Ambulatory Visit | Attending: General Surgery

## 2023-02-19 ENCOUNTER — Encounter (HOSPITAL_COMMUNITY): Payer: Self-pay

## 2023-02-19 ENCOUNTER — Other Ambulatory Visit: Payer: Self-pay

## 2023-02-19 VITALS — BP 150/80 | HR 86 | Resp 16 | Ht 64.0 in | Wt 172.0 lb

## 2023-02-19 DIAGNOSIS — Z01812 Encounter for preprocedural laboratory examination: Secondary | ICD-10-CM | POA: Insufficient documentation

## 2023-02-19 DIAGNOSIS — Z01818 Encounter for other preprocedural examination: Secondary | ICD-10-CM | POA: Diagnosis not present

## 2023-02-19 HISTORY — DX: Personal history of other diseases of the digestive system: Z87.19

## 2023-02-19 LAB — CBC
HCT: 36.7 % (ref 36.0–46.0)
Hemoglobin: 12.3 g/dL (ref 12.0–15.0)
MCH: 30.7 pg (ref 26.0–34.0)
MCHC: 33.5 g/dL (ref 30.0–36.0)
MCV: 91.5 fL (ref 80.0–100.0)
Platelets: 324 10*3/uL (ref 150–400)
RBC: 4.01 MIL/uL (ref 3.87–5.11)
RDW: 12.5 % (ref 11.5–15.5)
WBC: 8.8 10*3/uL (ref 4.0–10.5)
nRBC: 0 % (ref 0.0–0.2)

## 2023-02-26 ENCOUNTER — Other Ambulatory Visit: Payer: Self-pay

## 2023-02-26 ENCOUNTER — Ambulatory Visit (HOSPITAL_COMMUNITY)
Admission: RE | Admit: 2023-02-26 | Discharge: 2023-02-26 | Disposition: A | Discharge status: Home / Self Care | Payer: Medicare HMO | Attending: General Surgery | Admitting: General Surgery

## 2023-02-26 ENCOUNTER — Ambulatory Visit (HOSPITAL_BASED_OUTPATIENT_CLINIC_OR_DEPARTMENT_OTHER): Payer: Medicare HMO | Admitting: Anesthesiology

## 2023-02-26 ENCOUNTER — Encounter (HOSPITAL_COMMUNITY): Payer: Self-pay | Admitting: General Surgery

## 2023-02-26 ENCOUNTER — Ambulatory Visit (HOSPITAL_COMMUNITY): Payer: Medicare HMO | Admitting: Anesthesiology

## 2023-02-26 ENCOUNTER — Encounter (HOSPITAL_COMMUNITY)
Admission: RE | Disposition: A | Discharge status: Home / Self Care | Payer: Self-pay | Source: Home / Self Care | Attending: General Surgery

## 2023-02-26 DIAGNOSIS — K219 Gastro-esophageal reflux disease without esophagitis: Secondary | ICD-10-CM | POA: Insufficient documentation

## 2023-02-26 DIAGNOSIS — I2510 Atherosclerotic heart disease of native coronary artery without angina pectoris: Secondary | ICD-10-CM | POA: Diagnosis not present

## 2023-02-26 DIAGNOSIS — F17210 Nicotine dependence, cigarettes, uncomplicated: Secondary | ICD-10-CM | POA: Insufficient documentation

## 2023-02-26 DIAGNOSIS — E039 Hypothyroidism, unspecified: Secondary | ICD-10-CM | POA: Diagnosis not present

## 2023-02-26 DIAGNOSIS — Z01818 Encounter for other preprocedural examination: Secondary | ICD-10-CM

## 2023-02-26 DIAGNOSIS — K8010 Calculus of gallbladder with chronic cholecystitis without obstruction: Secondary | ICD-10-CM

## 2023-02-26 DIAGNOSIS — K449 Diaphragmatic hernia without obstruction or gangrene: Secondary | ICD-10-CM | POA: Insufficient documentation

## 2023-02-26 DIAGNOSIS — F419 Anxiety disorder, unspecified: Secondary | ICD-10-CM | POA: Insufficient documentation

## 2023-02-26 DIAGNOSIS — F32A Depression, unspecified: Secondary | ICD-10-CM | POA: Diagnosis not present

## 2023-02-26 DIAGNOSIS — F418 Other specified anxiety disorders: Secondary | ICD-10-CM | POA: Diagnosis not present

## 2023-02-26 HISTORY — PX: CHOLECYSTECTOMY: SHX55

## 2023-02-26 SURGERY — LAPAROSCOPIC CHOLECYSTECTOMY
Anesthesia: General

## 2023-02-26 MED ORDER — SUGAMMADEX SODIUM 200 MG/2ML IV SOLN
INTRAVENOUS | Status: DC | PRN
  Administered 2023-02-26: 300 mg via INTRAVENOUS

## 2023-02-26 MED ORDER — ONDANSETRON HCL 4 MG/2ML IJ SOLN
INTRAMUSCULAR | Status: DC | PRN
  Administered 2023-02-26: 4 mg via INTRAVENOUS

## 2023-02-26 MED ORDER — ACETAMINOPHEN 500 MG PO TABS
1000.0000 mg | ORAL_TABLET | ORAL | Status: AC
  Administered 2023-02-26: 1000 mg via ORAL
  Filled 2023-02-26: qty 2

## 2023-02-26 MED ORDER — CHLORHEXIDINE GLUCONATE CLOTH 2 % EX PADS
6.0000 | MEDICATED_PAD | Freq: Once | CUTANEOUS | Status: DC
Start: 2023-02-26 — End: 2023-02-26

## 2023-02-26 MED ORDER — DEXAMETHASONE SODIUM PHOSPHATE 10 MG/ML IJ SOLN
INTRAMUSCULAR | Status: AC
  Filled 2023-02-26: qty 1

## 2023-02-26 MED ORDER — DROPERIDOL 2.5 MG/ML IJ SOLN: 0.6250 mg | Freq: Once | INTRAMUSCULAR | Status: DC | PRN

## 2023-02-26 MED ORDER — BUPIVACAINE-EPINEPHRINE 0.25% -1:200000 IJ SOLN
INTRAMUSCULAR | Status: AC
  Filled 2023-02-26: qty 1

## 2023-02-26 MED ORDER — HYDROMORPHONE HCL 1 MG/ML IJ SOLN
INTRAMUSCULAR | Status: AC
  Filled 2023-02-26: qty 1

## 2023-02-26 MED ORDER — ORAL CARE MOUTH RINSE: 15.0000 mL | Freq: Once | OROMUCOSAL | Status: AC

## 2023-02-26 MED ORDER — 0.9 % SODIUM CHLORIDE (POUR BTL) OPTIME
TOPICAL | Status: DC | PRN
  Administered 2023-02-26: 1000 mL

## 2023-02-26 MED ORDER — LACTATED RINGERS IR SOLN
Status: DC | PRN
  Administered 2023-02-26: 1000 mL

## 2023-02-26 MED ORDER — FENTANYL CITRATE (PF) 250 MCG/5ML IJ SOLN
INTRAMUSCULAR | Status: AC
  Filled 2023-02-26: qty 5

## 2023-02-26 MED ORDER — OXYCODONE HCL 5 MG/5ML PO SOLN
5.0000 mg | Freq: Once | ORAL | Status: AC | PRN
Start: 2023-02-26 — End: 2023-02-26

## 2023-02-26 MED ORDER — ROCURONIUM BROMIDE 10 MG/ML (PF) SYRINGE
PREFILLED_SYRINGE | INTRAVENOUS | Status: DC | PRN
  Administered 2023-02-26: 50 mg via INTRAVENOUS

## 2023-02-26 MED ORDER — OXYCODONE HCL 5 MG PO TABS
5.0000 mg | ORAL_TABLET | Freq: Once | ORAL | Status: AC | PRN
  Administered 2023-02-26: 5 mg via ORAL

## 2023-02-26 MED ORDER — CEFAZOLIN SODIUM-DEXTROSE 2-4 GM/100ML-% IV SOLN
2.0000 g | INTRAVENOUS | Status: AC
  Administered 2023-02-26: 2 g via INTRAVENOUS
  Filled 2023-02-26: qty 100

## 2023-02-26 MED ORDER — ONDANSETRON HCL 4 MG/2ML IJ SOLN
INTRAMUSCULAR | Status: AC
  Filled 2023-02-26: qty 4

## 2023-02-26 MED ORDER — LACTATED RINGERS IV SOLN: INTRAVENOUS | Status: DC

## 2023-02-26 MED ORDER — PROPOFOL 10 MG/ML IV BOLUS
INTRAVENOUS | Status: AC
  Filled 2023-02-26: qty 20

## 2023-02-26 MED ORDER — PHENYLEPHRINE HCL-NACL 20-0.9 MG/250ML-% IV SOLN
INTRAVENOUS | Status: DC | PRN
  Administered 2023-02-26 (×3): 80 ug via INTRAVENOUS

## 2023-02-26 MED ORDER — PHENYLEPHRINE 80 MCG/ML (10ML) SYRINGE FOR IV PUSH (FOR BLOOD PRESSURE SUPPORT): PREFILLED_SYRINGE | INTRAVENOUS | Status: DC | PRN

## 2023-02-26 MED ORDER — MIDAZOLAM HCL 2 MG/2ML IJ SOLN
INTRAMUSCULAR | Status: AC
  Filled 2023-02-26: qty 2

## 2023-02-26 MED ORDER — MEPERIDINE HCL 50 MG/ML IJ SOLN: 6.2500 mg | INTRAMUSCULAR | Status: DC | PRN

## 2023-02-26 MED ORDER — DEXAMETHASONE SODIUM PHOSPHATE 10 MG/ML IJ SOLN
INTRAMUSCULAR | Status: DC | PRN
  Administered 2023-02-26: 10 mg via INTRAVENOUS

## 2023-02-26 MED ORDER — BUPIVACAINE-EPINEPHRINE 0.25% -1:200000 IJ SOLN
INTRAMUSCULAR | Status: DC | PRN
  Administered 2023-02-26: 40 mL

## 2023-02-26 MED ORDER — OXYCODONE HCL 5 MG PO TABS: 5.0000 mg | ORAL_TABLET | Freq: Four times a day (QID) | ORAL | 0 refills | Status: DC | PRN

## 2023-02-26 MED ORDER — CHLORHEXIDINE GLUCONATE CLOTH 2 % EX PADS: 6.0000 | MEDICATED_PAD | Freq: Once | CUTANEOUS | Status: DC

## 2023-02-26 MED ORDER — HYDROMORPHONE HCL 1 MG/ML IJ SOLN
0.2500 mg | INTRAMUSCULAR | Status: DC | PRN
  Administered 2023-02-26 (×4): 0.5 mg via INTRAVENOUS

## 2023-02-26 MED ORDER — MIDAZOLAM HCL 2 MG/2ML IJ SOLN
INTRAMUSCULAR | Status: DC | PRN
  Administered 2023-02-26: 2 mg via INTRAVENOUS

## 2023-02-26 MED ORDER — OXYCODONE HCL 5 MG PO TABS
ORAL_TABLET | ORAL | Status: AC
  Filled 2023-02-26: qty 1

## 2023-02-26 MED ORDER — KETOROLAC TROMETHAMINE 30 MG/ML IJ SOLN
INTRAMUSCULAR | Status: AC
  Filled 2023-02-26: qty 1

## 2023-02-26 MED ORDER — LIDOCAINE HCL (PF) 2 % IJ SOLN
INTRAMUSCULAR | Status: AC
  Filled 2023-02-26: qty 10

## 2023-02-26 MED ORDER — GABAPENTIN 300 MG PO CAPS
300.0000 mg | ORAL_CAPSULE | Freq: Once | ORAL | Status: AC
  Administered 2023-02-26: 300 mg via ORAL
  Filled 2023-02-26: qty 1

## 2023-02-26 MED ORDER — PROPOFOL 10 MG/ML IV BOLUS
INTRAVENOUS | Status: DC | PRN
Start: 2023-02-26 — End: 2023-02-26
  Administered 2023-02-26: 120 mg via INTRAVENOUS

## 2023-02-26 MED ORDER — EPHEDRINE SULFATE-NACL 50-0.9 MG/10ML-% IV SOSY
PREFILLED_SYRINGE | INTRAVENOUS | Status: DC | PRN
  Administered 2023-02-26: 10 mg via INTRAVENOUS

## 2023-02-26 MED ORDER — CHLORHEXIDINE GLUCONATE 0.12 % MT SOLN
15.0000 mL | Freq: Once | OROMUCOSAL | Status: AC
  Administered 2023-02-26: 15 mL via OROMUCOSAL

## 2023-02-26 MED ORDER — PHENYLEPHRINE 80 MCG/ML (10ML) SYRINGE FOR IV PUSH (FOR BLOOD PRESSURE SUPPORT)
PREFILLED_SYRINGE | INTRAVENOUS | Status: AC
  Filled 2023-02-26: qty 10

## 2023-02-26 MED ORDER — LIDOCAINE 2% (20 MG/ML) 5 ML SYRINGE
INTRAMUSCULAR | Status: DC | PRN
  Administered 2023-02-26: 70 mg via INTRAVENOUS

## 2023-02-26 MED ORDER — FENTANYL CITRATE (PF) 250 MCG/5ML IJ SOLN
INTRAMUSCULAR | Status: DC | PRN
  Administered 2023-02-26 (×3): 25 ug via INTRAVENOUS
  Administered 2023-02-26: 100 ug via INTRAVENOUS
  Administered 2023-02-26: 50 ug via INTRAVENOUS
  Administered 2023-02-26: 25 ug via INTRAVENOUS

## 2023-02-26 SURGICAL SUPPLY — 39 items
APPLIER CLIP 5 13 M/L LIGAMAX5 (MISCELLANEOUS)
APPLIER CLIP ROT 10 11.4 M/L (STAPLE)
BAG COUNTER SPONGE SURGICOUNT (BAG) IMPLANT
BENZOIN TINCTURE PRP APPL 2/3 (GAUZE/BANDAGES/DRESSINGS) IMPLANT
BNDG ADH 1X3 SHEER STRL LF (GAUZE/BANDAGES/DRESSINGS) IMPLANT
CABLE HIGH FREQUENCY MONO STRZ (ELECTRODE) ×1 IMPLANT
CHLORAPREP W/TINT 26 (MISCELLANEOUS) ×1 IMPLANT
CLIP APPLIE 5 13 M/L LIGAMAX5 (MISCELLANEOUS) IMPLANT
CLIP APPLIE ROT 10 11.4 M/L (STAPLE) IMPLANT
CLIP LIGATING HEM O LOK PURPLE (MISCELLANEOUS) IMPLANT
CLIP LIGATING HEMO O LOK GREEN (MISCELLANEOUS) ×1 IMPLANT
COVER MAYO STAND XLG (MISCELLANEOUS) ×1 IMPLANT
COVER SURGICAL LIGHT HANDLE (MISCELLANEOUS) ×1 IMPLANT
DERMABOND ADVANCED .7 DNX12 (GAUZE/BANDAGES/DRESSINGS) IMPLANT
DRAIN CHANNEL 19F RND (DRAIN) IMPLANT
DRAPE C-ARM 42X120 X-RAY (DRAPES) IMPLANT
ENDOLOOP SUT PDS II 0 18 (SUTURE) IMPLANT
EVACUATOR SILICONE 100CC (DRAIN) IMPLANT
GLOVE BIOGEL PI IND STRL 7.0 (GLOVE) ×1 IMPLANT
GLOVE SURG SS PI 7.0 STRL IVOR (GLOVE) ×1 IMPLANT
GOWN STRL REUS W/ TWL LRG LVL3 (GOWN DISPOSABLE) ×1 IMPLANT
GRASPER SUT TROCAR 14GX15 (MISCELLANEOUS) IMPLANT
IRRIG SUCT STRYKERFLOW 2 WTIP (MISCELLANEOUS) ×1
IRRIGATION SUCT STRKRFLW 2 WTP (MISCELLANEOUS) ×1 IMPLANT
KIT BASIN OR (CUSTOM PROCEDURE TRAY) ×1 IMPLANT
KIT TURNOVER KIT A (KITS) IMPLANT
POUCH RETRIEVAL ECOSAC 10 (ENDOMECHANICALS) ×1 IMPLANT
SCISSORS LAP 5X35 DISP (ENDOMECHANICALS) ×1 IMPLANT
SET CHOLANGIOGRAPH MIX (MISCELLANEOUS) IMPLANT
SET TUBE SMOKE EVAC HIGH FLOW (TUBING) ×1 IMPLANT
SLEEVE Z-THREAD 5X100MM (TROCAR) ×2 IMPLANT
SPIKE FLUID TRANSFER (MISCELLANEOUS) ×1 IMPLANT
STRIP CLOSURE SKIN 1/2X4 (GAUZE/BANDAGES/DRESSINGS) IMPLANT
SUT ETHILON 2 0 PS N (SUTURE) IMPLANT
SUT MNCRL AB 4-0 PS2 18 (SUTURE) ×1 IMPLANT
TOWEL OR 17X26 10 PK STRL BLUE (TOWEL DISPOSABLE) ×1 IMPLANT
TRAY LAPAROSCOPIC (CUSTOM PROCEDURE TRAY) ×1 IMPLANT
TROCAR ADV FIXATION 12X100MM (TROCAR) ×1 IMPLANT
TROCAR Z-THREAD OPTICAL 5X100M (TROCAR) ×1 IMPLANT

## 2023-02-26 NOTE — Anesthesia Preprocedure Evaluation (Addendum)
 Anesthesia Evaluation  Patient identified by MRN, date of birth, ID band Patient awake    Reviewed: Allergy & Precautions, NPO status , Patient's Chart, lab work & pertinent test results  History of Anesthesia Complications (+) PONV and history of anesthetic complications  Airway Mallampati: II  TM Distance: >3 FB Neck ROM: Full    Dental  (+) Dental Advisory Given   Pulmonary neg recent URI, Current Smoker and Patient abstained from smoking.   Pulmonary exam normal breath sounds clear to auscultation       Cardiovascular + CAD  Normal cardiovascular exam Rhythm:Regular Rate:Normal  CT Coronary Ca++ Coronary arteries: Normal origins. Coronary Calcium  Score: Left main: 0 Left anterior descending artery: 51.8 Left circumflex artery: 61.9 Right coronary artery: 51.1 Total: 165 Percentile: 84% Pericardium: Normal. Ascending Aorta: Normal caliber. Non-cardiac: See separate report from [facility].   IMPRESSION: Coronary calcium  score of 165. This was 84% percentile for age-, race-, and sex-matched controls.    Neuro/Psych  PSYCHIATRIC DISORDERS Anxiety Depression    negative neurological ROS     GI/Hepatic Neg liver ROS, hiatal hernia,GERD  Medicated and Controlled,,  Endo/Other  Hypothyroidism    Renal/GU negative Renal ROS     Musculoskeletal  (+) Arthritis ,  Fibromyalgia -  Abdominal   Peds  Hematology negative hematology ROS (+)   Anesthesia Other Findings   Reproductive/Obstetrics                             Anesthesia Physical Anesthesia Plan  ASA: 3  Anesthesia Plan: General   Post-op Pain Management: Tylenol  PO (pre-op)* and Gabapentin  PO (pre-op)*   Induction: Intravenous  PONV Risk Score and Plan: 4 or greater and Ondansetron , Dexamethasone  and Treatment may vary due to age or medical condition  Airway Management Planned: Oral ETT  Additional  Equipment:   Intra-op Plan:   Post-operative Plan: Extubation in OR  Informed Consent: I have reviewed the patients History and Physical, chart, labs and discussed the procedure including the risks, benefits and alternatives for the proposed anesthesia with the patient or authorized representative who has indicated his/her understanding and acceptance.     Dental advisory given  Plan Discussed with: CRNA  Anesthesia Plan Comments:         Anesthesia Quick Evaluation

## 2023-02-26 NOTE — H&P (Signed)
 Chief Complaint: New Consultation (gallbladder)  History of Present Illness: Heather [name] is a 68 y.o. female who is seen today as an office consultation for evaluation of New Consultation (gallbladder)  She has been having intermittent epigastric pain. She has had 2 significant attacks that last greater than 3 hours. Never been to the ER but her spouse has encouraged her to go to the ER due to the severity of the pain multiple times. She does have nausea. Food can be related to the pain  Review of Systems: A complete review of systems was obtained from the patient. I have reviewed this information and discussed as appropriate with the patient. See HPI as well for other ROS.  Review of Systems  Constitutional: Negative.  HENT: Negative.  Eyes: Negative.  Respiratory: Negative.  Cardiovascular: Negative.  Gastrointestinal: Negative.  Genitourinary: Negative.  Musculoskeletal: Negative.  Skin: Negative.  Neurological: Negative.  Endo/Heme/Allergies: Negative.  Psychiatric/Behavioral: Negative.    Medical History: Past Medical History:  Diagnosis Date  Anxiety  Arthritis  GERD (gastroesophageal reflux disease)  Hyperlipidemia  Thyroid  disease   There is no problem list on file for this patient.  Past Surgical History:  Procedure Laterality Date  CATARACT EXTRACTION  HYSTERECTOMY  left foot surgery    No Known Allergies  Current Outpatient Medications on File Prior to Visit  Medication Sig Dispense Refill  aspirin  81 MG EC tablet Take 81 mg by mouth once daily  atorvastatin  (LIPITOR) 40 MG tablet Take 40 mg by mouth once daily  busPIRone  (BUSPAR ) 30 MG tablet Take 0.5 tablets by mouth 2 (two) times daily  cyanocobalamin (VITAMIN B12) 1000 MCG tablet Take 1,000 mcg by mouth once daily  ergocalciferol , vitamin D2, 1,250 mcg (50,000 unit) capsule Take 50,000 Units by mouth every 7 (seven) days  FLUoxetine  (PROZAC ) 40 MG capsule Take 40 mg by mouth once daily  levothyroxine   (SYNTHROID ) 112 MCG tablet Take 112 mcg by mouth once daily  omeprazole  (PRILOSEC) 40 MG DR capsule Take 40 mg by mouth once daily   No current facility-administered medications on file prior to visit.   Family History  Problem Relation Age of Onset  Colon cancer Mother  Coronary Artery Disease (Blocked arteries around heart) Father  Diabetes Father  High blood pressure (Hypertension) Sister  Hyperlipidemia (Elevated cholesterol) Sister  Coronary Artery Disease (Blocked arteries around heart) Sister  Diabetes Sister  High blood pressure (Hypertension) Brother  Hyperlipidemia (Elevated cholesterol) Brother  Coronary Artery Disease (Blocked arteries around heart) Brother  Diabetes Brother    Social History   Tobacco Use  Smoking Status Every Day  Types: Cigarettes  Smokeless Tobacco Never    Social History   Socioeconomic History  Marital status: Married  Tobacco Use  Smoking status: Every Day  Types: Cigarettes  Smokeless tobacco: Never  Vaping Use  Vaping status: Never Used  Substance and Sexual Activity  Alcohol use: Not Currently  Drug use: Never   Social Drivers of Health   Financial Resource Strain: Low Risk (03/13/2022)  Received from [facility]  Overall Financial Resource Strain (CARDIA)  Difficulty of Paying Living Expenses: Not hard at all  Food Insecurity: No Food Insecurity (03/13/2022)  Received from [facility]  Hunger Vital Sign  Worried About Running Out of Food in the Last Year: Never true  Ran Out of Food in the Last Year: Never true  Transportation Needs: No Transportation Needs (03/13/2022)  Received from [facility] - Transportation  Lack of Transportation (Medical): No  Lack of Transportation (Non-Medical): No  Physical Activity: Insufficiently Active (03/13/2022)  Received from [facility]  Exercise Vital Sign  Days of Exercise per Week: 3 days  Minutes of Exercise per Session: 30 min  Stress: No Stress Concern Present  (03/13/2022)  Received from [facility] - Occupational Stress Questionnaire  Feeling of Stress : Not at all  Social Connections: Moderately Integrated (03/13/2022)  Received from [facility]  Social Connection and Isolation Panel [NHANES]  Frequency of Communication with Friends and Family: More than three times a week  Frequency of Social Gatherings with Friends and Family: Twice a week  Attends Religious Services: More than 4 times per year  Active Member of [employer] or Organizations: No  Attends Banker Meetings: Never  Marital Status: Married  Housing Stability: Unknown (01/18/2023)  Housing Stability Vital Sign  Homeless in the Last Year: No   Objective:   Vitals:  01/18/23 1514  BP: 128/76  Pulse: 82  Temp: 36.8 C (98.2 F)  SpO2: 96%  Weight: 80.2 kg (176 lb 12.8 oz)  Height: 162.6 cm (5\' 4" )  PainSc: 0-No pain   Body mass index is 30.35 kg/m.  Physical Exam Constitutional:  Appearance: Normal appearance.  HENT:  Head: Normocephalic and atraumatic.  Pulmonary:  Effort: Pulmonary effort is normal.  Abdominal:  Comments: Slight pain/bloating on palpation of the right upper quadrant  Musculoskeletal:  General: Normal range of motion.  Cervical back: Normal range of motion.  Neurological:  General: No focal deficit present.  Mental Status: She is alert and oriented to person, place, and time. Mental status is at baseline.  Psychiatric:  Mood and Affect: Mood normal.  Behavior: Behavior normal.  Thought Content: Thought content normal.     Labs, Imaging and Diagnostic Testing: I reviewed images of the CT scan showing multiple calcified stones within the gallbladder  Assessment and Plan:   Diagnoses and all orders for this visit:  Calculus of gallbladder with chronic cholecystitis without obstruction  Tobacco abuse  Coronary artery disease involving native coronary artery of native heart without angina  pectoris   We discussed the etiology of gallstones and probably cause pain. We discussed exacerbating factors including fatty meals. We discussed the details of surgery for removal of the gallbladder including general anesthesia, 4 small incisions in the patient's abdomen, removal of the patient's gallbladder with the liver and common bile duct, and most likely outpatient procedure. We discussed risks of common bile duct injury, cystic duct stump leak, injury to liver, bleeding, infection, need for open procedure, and post cholecystectomy syndrome. The patient showed good understanding and wanted to proceed with laparoscopic cholecystectomy.

## 2023-02-26 NOTE — Transfer of Care (Signed)
 Immediate Anesthesia Transfer of Care Note  Patient: Heather [name]  Procedure(s) Performed: LAPAROSCOPIC CHOLECYSTECTOMY  Patient Location: PACU  Anesthesia Type:General  Level of Consciousness: awake, alert , and oriented  Airway & Oxygen Therapy: Patient Spontanous Breathing  Post-op Assessment: Report given to RN and Post -op Vital signs reviewed and stable  Post vital signs: Reviewed and stable  Last Vitals:  Vitals Value Taken Time  BP 144/67 02/26/23 1038  Temp    Pulse 66 02/26/23 1041  Resp 12 02/26/23 1041  SpO2 96 % 02/26/23 1041  Vitals shown include unfiled device data.  Last Pain:  Vitals:   02/26/23 0800  TempSrc: Oral  PainSc:          Complications: No notable events documented.

## 2023-02-26 NOTE — Op Note (Signed)
 PATIENT:  Heather [name]  68 y.o. female  PRE-OPERATIVE DIAGNOSIS:  CHRONIC CALCULUS CHOLECYSTITIS  POST-OPERATIVE DIAGNOSIS:  CHRONIC CALCULUS CHOLECYSTITIS  PROCEDURE:  Procedure(s): LAPAROSCOPIC CHOLECYSTECTOMY   SURGEON:  Modest Draeger, Alphonso Aschoff, MD   ASSISTANT: none  ANESTHESIA:   local and general  Indications for procedure: Heather [name] is a 68 y.o. female with symptoms of Abdominal pain consistent with gallbladder disease, Confirmed by nuclear medicine study.  Description of procedure: The patient was brought into the operative suite, placed supine. Anesthesia was administered with endotracheal tube. Patient was strapped in place and foot board was secured. All pressure points were offloaded by foam padding. The patient was prepped and draped in the usual sterile fashion.  A periumbilical incision was made and optical entry was used to enter the abdomen. 2 5 mm trocars were placed on in the right lateral space on in the right subcostal space. A 12mm trocar was placed in the subxiphoid space. Marcaine  was infused to the subxiphoid space and lateral upper right abdomen in the transversus abdominis plane. Next the patient was placed in reverse trendelenberg. The gallbladder appearedchronically inflamed.   The gallbladder was retracted cephalad and lateral. The peritoneum was reflected off the infundibulum working lateral to medial. The cystic duct and cystic artery were identified and further dissection revealed a critical view. The cystic duct and cystic artery were doubly clipped and ligated.   The gallbladder was removed off the liver bed with cautery. The Gallbladder was placed in a specimen bag. The gallbladder fossa was irrigated and hemostasis was applied with cautery. The gallbladder was removed via the 12mm trocar. The fascial defect was closed with interrupted 0 vicryl suture via laparoscopic trans-fascial suture passer. Pneumoperitoneum was removed, all trocar were removed. All  incisions were closed with 4-0 monocryl subcuticular stitch. The patient woke from anesthesia and was brought to PACU in stable condition. All counts were correct  Findings: chronic cholecystitis  Specimen: gallbladder  Blood loss: 20 ml  Local anesthesia: 40 ml Marcaine   Complications: none  PLAN OF CARE: Discharge to home after PACU  PATIENT DISPOSITION:  PACU - hemodynamically stable.  Alphonso Aschoff [facility], [place]

## 2023-02-26 NOTE — Anesthesia Procedure Notes (Signed)
 Procedure Name: Intubation Date/Time: 02/26/2023 9:45 AM  Performed by: Virgil Griffiths, CRNAPre-anesthesia Checklist: Patient identified, Emergency Drugs available, Suction available and Patient being monitored Patient Re-evaluated:Patient Re-evaluated prior to induction Oxygen Delivery Method: Circle System Utilized Preoxygenation: Pre-oxygenation with 100% oxygen Induction Type: IV induction Ventilation: Mask ventilation without difficulty Laryngoscope Size: Mac and 3 Grade View: Grade I Tube type: Oral Tube size: 7.0 mm Number of attempts: 1 Airway Equipment and Method: Stylet and Oral airway Placement Confirmation: ETT inserted through vocal cords under direct vision, positive ETCO2 and breath sounds checked- equal and bilateral Secured at: 22 cm Tube secured with: Tape Dental Injury: Teeth and Oropharynx as per pre-operative assessment

## 2023-02-26 NOTE — Discharge Instructions (Signed)
 [facility] ______[facility] LAPAROSCOPIC SURGERY: POST OP INSTRUCTIONS Always review your discharge instruction sheet given to you by the facility where your surgery was performed. IF YOU HAVE DISABILITY OR FAMILY LEAVE FORMS, YOU MUST BRING THEM TO THE OFFICE FOR PROCESSING.   DO NOT GIVE THEM TO YOUR DOCTOR.  A prescription for pain medication may be given to you upon discharge.  Take your pain medication as prescribed, if needed.  If narcotic pain medicine is not needed, then you may take acetaminophen (Tylenol) or ibuprofen (Advil) as needed. Take your usually prescribed medications unless otherwise directed. If you need a refill on your pain medication, please contact your pharmacy.  They will contact our office to request authorization. Prescriptions will not be filled after 5pm or on week-ends. You should follow a light diet the first few days after arrival home, such as soup and crackers, etc.  Be sure to include lots of fluids daily. Most patients will experience some swelling and bruising in the area of the incisions.  Ice packs will help.  Swelling and bruising can take several days to resolve.  It is common to experience some constipation if taking pain medication after surgery.  Increasing fluid intake and taking a stool softener (such as Colace) will usually help or prevent this problem from occurring.  A mild laxative (Milk of Magnesia or Miralax) should be taken according to package instructions if there are no bowel movements after 48 hours. Unless discharge instructions indicate otherwise, you may remove your bandages 24-48 hours after surgery, and you may shower at that time.  You may have steri-strips (small skin tapes) in place directly over the incision.  These strips should be left on the skin for 7-10 days.  If your surgeon used skin glue on the incision, you may shower in 24 hours.  The glue will flake off over the next 2-3 weeks.  Any sutures or staples will be  removed at the office during your follow-up visit. ACTIVITIES:  You may resume regular (light) daily activities beginning the next day--such as daily self-care, walking, climbing stairs--gradually increasing activities as tolerated.  You may have sexual intercourse when it is comfortable.  Refrain from any heavy lifting or straining until approved by your doctor. You may drive when you are no longer taking prescription pain medication, you can comfortably wear a seatbelt, and you can safely maneuver your car and apply brakes. RETURN TO WORK:  __________________________________________________________ Heather [name] should see your doctor in the office for a follow-up appointment approximately 2-3 weeks after your surgery.  Make sure that you call for this appointment within a day or two after you arrive home to insure a convenient appointment time. OTHER INSTRUCTIONS: __________________________________________________________________________________________________________________________ __________________________________________________________________________________________________________________________ WHEN TO CALL YOUR DOCTOR: Fever over 101.0 Inability to urinate Continued bleeding from incision. Increased pain, redness, or drainage from the incision. Increasing abdominal pain  The clinic staff is available to answer your questions during regular business hours.  Please don't hesitate to call and ask to speak to one of the nurses for clinical concerns.  If you have a medical emergency, go to the nearest emergency room or call 911.  A surgeon from [facility] is always on call at the hospital. [address], [address], [place], [place]  [address] ? [address] [address], [place], [place]   [address] [phone] ? [phone] ? FAX [phone] Web site: [email or web address]

## 2023-02-27 ENCOUNTER — Encounter (HOSPITAL_COMMUNITY): Payer: Self-pay | Admitting: General Surgery

## 2023-02-27 LAB — SURGICAL PATHOLOGY

## 2023-02-27 NOTE — Anesthesia Postprocedure Evaluation (Signed)
Anesthesia Post Note  Patient: Heather [name]  Procedure(s) Performed: LAPAROSCOPIC CHOLECYSTECTOMY     Patient location during evaluation: PACU Anesthesia Type: General Level of consciousness: sedated and patient cooperative Pain management: pain level controlled Vital Signs Assessment: post-procedure vital signs reviewed and stable Respiratory status: spontaneous breathing Cardiovascular status: stable Anesthetic complications: no   No notable events documented.  Last Vitals:  Vitals:   02/26/23 1145 02/26/23 1200  BP: 129/61 (!) 150/89  Pulse: 81 89  Resp: 19 14  Temp: 36.5 C   SpO2: 93% 91%    Last Pain:  Vitals:   02/26/23 1200  TempSrc:   PainSc: 4                  [name]

## 2023-02-28 HISTORY — PX: GALLBLADDER SURGERY: SHX652

## 2023-03-09 ENCOUNTER — Encounter: Payer: Self-pay | Admitting: *Deleted

## 2023-05-04 ENCOUNTER — Other Ambulatory Visit: Payer: Self-pay | Admitting: Family Medicine

## 2023-05-04 DIAGNOSIS — M8580 Other specified disorders of bone density and structure, unspecified site: Secondary | ICD-10-CM

## 2023-05-04 DIAGNOSIS — E559 Vitamin D deficiency, unspecified: Secondary | ICD-10-CM

## 2023-05-07 NOTE — Telephone Encounter (Signed)
 Last OV 06/09/22. Last RF 06/13/22. Next OV 07/10/23 AWV no schedule PCP visit

## 2023-05-08 NOTE — Telephone Encounter (Signed)
 She should start OTC 800IU daily of vitamin d  after high dose.  Her vit d was only minimally low back in may 2024.

## 2023-06-15 ENCOUNTER — Ambulatory Visit (INDEPENDENT_AMBULATORY_CARE_PROVIDER_SITE_OTHER): Admitting: Family Medicine

## 2023-06-15 ENCOUNTER — Other Ambulatory Visit: Payer: Self-pay

## 2023-06-15 ENCOUNTER — Encounter: Payer: Self-pay | Admitting: Family Medicine

## 2023-06-15 VITALS — BP 130/76 | HR 75 | Temp 98.5°F | Ht 64.0 in | Wt 175.0 lb

## 2023-06-15 DIAGNOSIS — E559 Vitamin D deficiency, unspecified: Secondary | ICD-10-CM | POA: Diagnosis not present

## 2023-06-15 DIAGNOSIS — F17200 Nicotine dependence, unspecified, uncomplicated: Secondary | ICD-10-CM

## 2023-06-15 DIAGNOSIS — K219 Gastro-esophageal reflux disease without esophagitis: Secondary | ICD-10-CM

## 2023-06-15 DIAGNOSIS — N3281 Overactive bladder: Secondary | ICD-10-CM | POA: Diagnosis not present

## 2023-06-15 DIAGNOSIS — E7800 Pure hypercholesterolemia, unspecified: Secondary | ICD-10-CM

## 2023-06-15 DIAGNOSIS — L309 Dermatitis, unspecified: Secondary | ICD-10-CM

## 2023-06-15 DIAGNOSIS — F411 Generalized anxiety disorder: Secondary | ICD-10-CM

## 2023-06-15 DIAGNOSIS — E034 Atrophy of thyroid (acquired): Secondary | ICD-10-CM | POA: Diagnosis not present

## 2023-06-15 DIAGNOSIS — Z122 Encounter for screening for malignant neoplasm of respiratory organs: Secondary | ICD-10-CM

## 2023-06-15 DIAGNOSIS — E785 Hyperlipidemia, unspecified: Secondary | ICD-10-CM | POA: Diagnosis not present

## 2023-06-15 DIAGNOSIS — I2510 Atherosclerotic heart disease of native coronary artery without angina pectoris: Secondary | ICD-10-CM

## 2023-06-15 DIAGNOSIS — Z0001 Encounter for general adult medical examination with abnormal findings: Secondary | ICD-10-CM

## 2023-06-15 DIAGNOSIS — F17210 Nicotine dependence, cigarettes, uncomplicated: Secondary | ICD-10-CM

## 2023-06-15 DIAGNOSIS — F331 Major depressive disorder, recurrent, moderate: Secondary | ICD-10-CM

## 2023-06-15 DIAGNOSIS — E669 Obesity, unspecified: Secondary | ICD-10-CM

## 2023-06-15 DIAGNOSIS — B0089 Other herpesviral infection: Secondary | ICD-10-CM

## 2023-06-15 DIAGNOSIS — M8580 Other specified disorders of bone density and structure, unspecified site: Secondary | ICD-10-CM | POA: Diagnosis not present

## 2023-06-15 DIAGNOSIS — Z0000 Encounter for general adult medical examination without abnormal findings: Secondary | ICD-10-CM

## 2023-06-15 DIAGNOSIS — Z87891 Personal history of nicotine dependence: Secondary | ICD-10-CM

## 2023-06-15 LAB — BAYER DCA HB A1C WAIVED: HB A1C (BAYER DCA - WAIVED): 5.6 % (ref 4.8–5.6)

## 2023-06-15 LAB — LIPID PANEL

## 2023-06-15 MED ORDER — TRIAMCINOLONE ACETONIDE 0.1 % EX CREA: 1.0000 | TOPICAL_CREAM | Freq: Two times a day (BID) | CUTANEOUS | 0 refills | Status: DC

## 2023-06-15 MED ORDER — ATORVASTATIN CALCIUM 40 MG PO TABS: 40.0000 mg | ORAL_TABLET | Freq: Every day | ORAL | 3 refills | Status: AC

## 2023-06-15 MED ORDER — FLUOXETINE HCL 40 MG PO CAPS: 40.0000 mg | ORAL_CAPSULE | Freq: Every day | ORAL | 3 refills | Status: AC

## 2023-06-15 MED ORDER — OMEPRAZOLE 40 MG PO CPDR
40.0000 mg | DELAYED_RELEASE_CAPSULE | Freq: Every day | ORAL | 3 refills | Status: AC
Start: 2023-06-15 — End: ?

## 2023-06-15 MED ORDER — OXYBUTYNIN CHLORIDE ER 5 MG PO TB24: 5.0000 mg | ORAL_TABLET | Freq: Every day | ORAL | 0 refills | Status: DC

## 2023-06-15 MED ORDER — VALACYCLOVIR HCL 1 G PO TABS: ORAL_TABLET | ORAL | 2 refills | Status: AC

## 2023-06-15 MED ORDER — LEVOTHYROXINE SODIUM 112 MCG PO TABS: 112.0000 ug | ORAL_TABLET | Freq: Every day | ORAL | 3 refills | Status: AC

## 2023-06-15 MED ORDER — BUSPIRONE HCL 30 MG PO TABS: ORAL_TABLET | ORAL | 3 refills | Status: DC

## 2023-06-15 NOTE — Progress Notes (Signed)
 Heather [name] is a 68 y.o. female presents to office today for annual physical exam examination.    Concerns today include: 1.  Cheek lesion She reports a lesion on the cheek on the left side that seems to be recurrent.  It starts as a patch and then she scratches it off and it goes away for a while but then returns.  She reports no spontaneous bleeding.  She does not report any significant changes in size.  She has not used any specific creams on it.  She does describe it as diffusely itchy  Marital status: Married to [employer], Substance use: tobacco Health Maintenance Due  Topic Date Due   Lung Cancer Screening  08/20/2007   COVID-19 Vaccine (4 - 2024-25 season) 09/17/2022   Medicare Annual Wellness (AWV)  03/14/2023   Refills needed today: all  Immunization History  Administered Date(s) Administered   Influenza,inj,Quad PF,6+ Mos 10/26/2014, 11/19/2017, 10/01/2018   Influenza-Unspecified 11/18/2019, 10/23/2020   Moderna Sars-Covid-2 Vaccination 04/17/2019, 05/16/2019, 11/18/2019   PNEUMOCOCCAL CONJUGATE-20 03/14/2021   Td 09/15/2010   Tdap 03/14/2021   Zoster Recombinant(Shingrix) 05/11/2016, 08/31/2016   Zoster, Live 05/12/2015   Past Medical History:  Diagnosis Date   Anxiety    Arthritis    Cataract    Depression    Fibromyalgia    GERD (gastroesophageal reflux disease)    History of hiatal hernia    Hyperlipidemia    no meds currently   Hypothyroidism    PONV (postoperative nausea and vomiting)    Thyroid  disease    hypo   Social History   Socioeconomic History   Marital status: Married    Spouse name: Occupational hygienist   Number of children: 1   Years of education: Not on file   Highest education level: Never attended school  Occupational History   Occupation: retired    Comment: Scientist, water quality and [employer]  Tobacco Use   Smoking status: Every Day    Current packs/day: 1.00    Average packs/day: 1 pack/day for 39.0 years (39.0 ttl pk-yrs)    Types:  Cigarettes   Smokeless tobacco: Never  Vaping Use   Vaping status: Never Used  Substance and Sexual Activity   Alcohol use: Yes    Comment: occasionally   Drug use: Never   Sexual activity: Not Currently    Birth control/protection: Surgical  Other Topics Concern   Not on file  Social History Narrative   Lives home with husband one level home   One son by first husband who lives in [place]   Social Drivers of Health   Financial Resource Strain: Low Risk  (06/14/2023)   Overall Financial Resource Strain (CARDIA)    Difficulty of Paying Living Expenses: Not hard at all  Food Insecurity: No Food Insecurity (06/14/2023)   Hunger Vital Sign    Worried About Running Out of Food in the Last Year: Never true    Ran Out of Food in the Last Year: Never true  Transportation Needs: No Transportation Needs (06/14/2023)   PRAPARE - Administrator, Civil Service (Medical): No    Lack of Transportation (Non-Medical): No  Physical Activity: Insufficiently Active (06/14/2023)   Exercise Vital Sign    Days of Exercise per Week: 1 day    Minutes of Exercise per Session: 30 min  Stress: No Stress Concern Present (06/14/2023)   [employer] of Occupational Health - Occupational Stress Questionnaire    Feeling of Stress : Not at all  Social Connections: Moderately Integrated (06/14/2023)   Social Connection and Isolation Panel [NHANES]    Frequency of Communication with Friends and Family: More than three times a week    Frequency of Social Gatherings with Friends and Family: Twice a week    Attends Religious Services: 1 to 4 times per year    Active Member of [employer] or Organizations: No    Attends Banker Meetings: Not on file    Marital Status: Married  Catering manager Violence: Not At Risk (03/13/2022)   Humiliation, Afraid, Rape, and Kick questionnaire    Fear of Current or Ex-Partner: No    Emotionally Abused: No    Physically Abused: No    Sexually Abused: No    Past Surgical History:  Procedure Laterality Date   CATARACT EXTRACTION Left    CATARACT EXTRACTION W/PHACO Right 12/29/2013   Procedure: CATARACT EXTRACTION PHACO AND INTRAOCULAR LENS PLACEMENT (IOC);  Surgeon: Anner Kill, MD;  Location: [facility];  Service: Ophthalmology;  Laterality: Right;  CDE:4.84   CHOLECYSTECTOMY N/A 02/26/2023   Procedure: LAPAROSCOPIC CHOLECYSTECTOMY;  Surgeon: Dorrie Gaudier Alphonso Aschoff, MD;  Location: [facility];  Service: General;  Laterality: N/A;   COLONOSCOPY     EYE SURGERY     cleaned debris from behind eye.   FOOT FRACTURE SURGERY Left 2017   heel   KNEE SURGERY Right 2018   torn meniscus   TUBAL LIGATION     tubal ligation reversal     VAGINAL HYSTERECTOMY     Family History  Problem Relation Age of Onset   Colon polyps Mother    Colon cancer Mother 58   Cancer Mother    Cancer - Colon Mother    Diabetes Father    Heart attack Father 41   Heart disease Father    Diabetes Sister    Leukemia Sister    Heart disease Sister    Acute lymphoblastic leukemia Sister    Diabetes Sister    Heart disease Sister    Thyroid  disease Sister    Lung cancer Sister    Cancer - Lung Sister    Diabetes Brother    Heart attack Brother 66   Hyperlipidemia Brother    Diabetes Brother    Mental illness Brother    Graves' disease Brother    Heart disease Brother    CAD Brother    Diabetes Brother    CAD Brother    Heart disease Brother    Healthy Son    Rectal cancer Other    Colon cancer Other 48   Esophageal cancer Neg Hx    Stomach cancer Neg Hx    Breast cancer Neg Hx     Current Outpatient Medications:    aspirin  EC 81 MG tablet, Take 1 tablet (81 mg total) by mouth daily. Swallow whole., Disp: , Rfl:    atorvastatin  (LIPITOR) 40 MG tablet, Take 1 tablet (40 mg total) by mouth daily., Disp: 100 tablet, Rfl: 3   busPIRone  (BUSPAR ) 30 MG tablet, Take 1/2 (one-half) tablet by mouth twice daily, Disp: 100 tablet, Rfl: 3   diclofenac  Sodium (VOLTAREN ) 1 %  GEL, Apply 4 g topically 4 (four) times daily. For knee pain/ arthritis (Patient taking differently: Apply 1 Application topically as needed (pain).), Disp: 400 g, Rfl: 0   FLUoxetine  (PROZAC ) 40 MG capsule, Take 1 capsule (40 mg total) by mouth daily., Disp: 100 capsule, Rfl: 3   levothyroxine  (SYNTHROID ) 112 MCG tablet, Take 1 tablet (112 mcg  total) by mouth daily before breakfast., Disp: 100 tablet, Rfl: 3   omeprazole  (PRILOSEC) 40 MG capsule, Take 1 capsule (40 mg total) by mouth daily., Disp: 100 capsule, Rfl: 3   oxyCODONE  (OXY IR/ROXICODONE ) 5 MG immediate release tablet, Take 1 tablet (5 mg total) by mouth every 6 (six) hours as needed for severe pain (pain score 7-10)., Disp: 15 tablet, Rfl: 0   valACYclovir  (VALTREX ) 1000 MG tablet, TAKE 2 TABLETS BY MOUTH TWICE DAILY FOR 1 DAY AT FEVER BLISTER ONSET, Disp: 20 tablet, Rfl: 2   Vitamin D , Ergocalciferol , (DRISDOL ) 1.25 MG (50000 UNIT) CAPS capsule, Take 1 capsule (50,000 Units total) by mouth every 7 (seven) days. (Patient taking differently: Take 50,000 Units by mouth every 7 (seven) days. Thursday), Disp: 12 capsule, Rfl: 3  No Known Allergies   ROS: Review of Systems A comprehensive review of systems was negative except for: Genitourinary: positive for urinary incontinence Integument/breast: positive for dryness and pruritus    Physical exam BP 130/76   Pulse 75   Temp 98.5 F (36.9 C)   Ht 5\' 4"  (1.626 m)   Wt 175 lb (79.4 kg)   SpO2 96%   BMI 30.04 kg/m  General appearance: alert, cooperative, appears stated age, and no distress Head: Normocephalic, without obvious abnormality, atraumatic Eyes: negative findings: lids and lashes normal, conjunctivae and sclerae normal, corneas clear, and pupils equal, round, reactive to Tubby and accomodation Ears: normal TM's and external ear canals both ears Nose: Nares normal. Septum midline. Mucosa normal. No drainage or sinus tenderness. Throat: lips, mucosa, and tongue normal; teeth  and gums normal Neck: no adenopathy, supple, symmetrical, trachea midline, and thyroid  not enlarged, symmetric, no tenderness/mass/nodules Back: Mildly increased kyphosis in the thoracic spine Lungs: clear to auscultation bilaterally Heart: regular rate and rhythm, S1, S2 normal, no murmur, click, rub or gallop Abdomen: soft, non-tender; bowel sounds normal; no masses,  no organomegaly Extremities: extremities normal, atraumatic, no cyanosis or edema Pulses: 2+ and symmetric Skin: She has a patch of dry, flaky skin along the left lateral jaw.  No appreciable hypervascularity, rolled borders or ulceration appreciated.  Skin is flesh-colored Lymph nodes: Cervical, supraclavicular, and axillary nodes normal. Neurologic: Grossly normal      06/15/2023    9:32 AM 06/09/2022    8:58 AM 03/13/2022    2:56 PM  Depression screen PHQ 2/9  Decreased Interest 0 0 0  Down, Depressed, Hopeless 0 0 0  PHQ - 2 Score 0 0 0  Altered sleeping 0 0   Tired, decreased energy 0 0   Change in appetite 0 0   Feeling bad or failure about yourself  0 0   Trouble concentrating 0 0   Moving slowly or fidgety/restless 0 0   Suicidal thoughts 0 0   PHQ-9 Score 0 0   Difficult doing work/chores Not difficult at all Not difficult at all       06/15/2023    9:32 AM 06/09/2022    8:58 AM 03/14/2021    9:41 AM 03/14/2021    9:18 AM  GAD 7 : Generalized Anxiety Score  Nervous, Anxious, on Edge 0 0 0 0  Control/stop worrying 0 2 0 0  Worry too much - different things 0 2 0 0  Trouble relaxing 0 0 0 0  Restless 0 0 0 0  Easily annoyed or irritable 0 0 0 0  Afraid - awful might happen 0 0 0 0  Total GAD 7 Score 0 4  0 0  Anxiety Difficulty Not difficult at all Not difficult at all Not difficult at all Not difficult at all     Assessment/ Plan: Jenny Mohs here for annual physical exam.   Annual physical exam  Dermatitis of face - Plan: triamcinolone  cream (KENALOG ) 0.1 %  Overactive bladder - Plan:  oxybutynin  (DITROPAN  XL) 5 MG 24 hr tablet  Hypothyroidism due to acquired atrophy of thyroid  - Plan: TSH + free T4  Hyperlipidemia with target LDL less than 100 - Plan: [id number], Lipid Panel  Nonobstructive atherosclerosis of coronary artery - Plan: atorvastatin  (LIPITOR) 40 MG tablet  Obesity (BMI 30-39.9) - Plan: Bayer DCA Hb A1c Waived  Current smoker - Plan: CBC, Ambulatory Referral Lung Cancer Screening [facility]  Vitamin D  deficiency - Plan: VITAMIN D  25 Hydroxy (Vit-D Deficiency, Fractures)  Osteopenia with high risk of fracture - Plan: VITAMIN D  25 Hydroxy (Vit-D Deficiency, Fractures)  Pure hypercholesterolemia - Plan: atorvastatin  (LIPITOR) 40 MG tablet  GAD (generalized anxiety disorder) - Plan: busPIRone  (BUSPAR ) 30 MG tablet  Moderate episode of recurrent major depressive disorder (HCC) - Plan: FLUoxetine  (PROZAC ) 40 MG capsule  Recurrent oral herpes simplex infection - Plan: valACYclovir  (VALTREX ) 1000 MG tablet  Gastroesophageal reflux disease without esophagitis - Plan: omeprazole  (PRILOSEC) 40 MG capsule  I am going to treat her as a dermatitis with triamcinolone  topically for short amount of time.  If not responding may need to consider referral to dermatology for further evaluation under dermatoscopy  Will trial her on Ditropan  for what is likely a combination of cystocele and overactive bladder.  All chronic issues are stable.  She is clinically euthymic.  Fasting labs collected.  All meds renewed.  Referred back to lung cancer screening to have this set up.  I see an active order but I do not see any appointments  Counseled on healthy lifestyle choices, including diet (rich in fruits, vegetables and lean meats and low in salt and simple carbohydrates) and exercise (at least 30 minutes of moderate physical activity daily).  Patient to follow up 41m-1 year  Kayhan Boardley M. Bonnell Butcher, DO

## 2023-06-15 NOTE — Patient Instructions (Signed)

## 2023-06-16 LAB — CMP14+EGFR
ALT: 13 IU/L (ref 0–32)
AST: 19 IU/L (ref 0–40)
Albumin: 4.4 g/dL (ref 3.9–4.9)
Alkaline Phosphatase: 94 IU/L (ref 44–121)
BUN/Creatinine Ratio: 18 (ref 12–28)
BUN: 11 mg/dL (ref 8–27)
Bilirubin Total: 0.4 mg/dL (ref 0.0–1.2)
CO2: 23 mmol/L (ref 20–29)
Calcium: 10.2 mg/dL (ref 8.7–10.3)
Chloride: 102 mmol/L (ref 96–106)
Creatinine, Ser: 0.62 mg/dL (ref 0.57–1.00)
Globulin, Total: 2.5 g/dL (ref 1.5–4.5)
Glucose: 84 mg/dL (ref 70–99)
Potassium: 5.4 mmol/L — ABNORMAL HIGH (ref 3.5–5.2)
Sodium: 138 mmol/L (ref 134–144)
Total Protein: 6.9 g/dL (ref 6.0–8.5)
eGFR: 97 mL/min/{1.73_m2} (ref >59)

## 2023-06-16 LAB — LIPID PANEL
Chol/HDL Ratio: 2.9 ratio (ref 0.0–4.4)
Cholesterol, Total: 129 mg/dL (ref 100–199)
HDL: 44 mg/dL (ref >39)
LDL Chol Calc (NIH): 65 mg/dL (ref 0–99)
Triglycerides: 109 mg/dL (ref 0–149)
VLDL Cholesterol Cal: 20 mg/dL (ref 5–40)

## 2023-06-16 LAB — CBC
Hematocrit: 40.4 % (ref 34.0–46.6)
Hemoglobin: 13.1 g/dL (ref 11.1–15.9)
MCH: 29.9 pg (ref 26.6–33.0)
MCHC: 32.4 g/dL (ref 31.5–35.7)
MCV: 92 fL (ref 79–97)
Platelets: 323 10*3/uL (ref 150–450)
RBC: 4.38 x10E6/uL (ref 3.77–5.28)
RDW: 12.1 % (ref 11.7–15.4)
WBC: 11.6 10*3/uL — ABNORMAL HIGH (ref 3.4–10.8)

## 2023-06-16 LAB — TSH+FREE T4
Free T4: 1.42 ng/dL (ref 0.82–1.77)
TSH: 0.928 u[IU]/mL (ref 0.450–4.500)

## 2023-06-16 LAB — VITAMIN D 25 HYDROXY (VIT D DEFICIENCY, FRACTURES): Vit D, 25-Hydroxy: 40.8 ng/mL (ref 30.0–100.0)

## 2023-06-18 ENCOUNTER — Ambulatory Visit: Payer: Self-pay | Admitting: Family Medicine

## 2023-06-26 ENCOUNTER — Ambulatory Visit (HOSPITAL_COMMUNITY)
Admission: RE | Admit: 2023-06-26 | Discharge: 2023-06-26 | Disposition: A | Discharge status: Home / Self Care | Source: Ambulatory Visit | Attending: Acute Care | Admitting: Acute Care

## 2023-06-26 DIAGNOSIS — F17210 Nicotine dependence, cigarettes, uncomplicated: Secondary | ICD-10-CM | POA: Insufficient documentation

## 2023-06-26 DIAGNOSIS — Z122 Encounter for screening for malignant neoplasm of respiratory organs: Secondary | ICD-10-CM | POA: Insufficient documentation

## 2023-06-26 DIAGNOSIS — Z87891 Personal history of nicotine dependence: Secondary | ICD-10-CM | POA: Diagnosis not present

## 2023-07-09 ENCOUNTER — Telehealth (INDEPENDENT_AMBULATORY_CARE_PROVIDER_SITE_OTHER): Admitting: Family Medicine

## 2023-07-09 ENCOUNTER — Encounter: Payer: Self-pay | Admitting: Family Medicine

## 2023-07-09 ENCOUNTER — Telehealth: Payer: Self-pay

## 2023-07-09 ENCOUNTER — Telehealth: Payer: Self-pay | Admitting: Family Medicine

## 2023-07-09 ENCOUNTER — Other Ambulatory Visit: Payer: Self-pay

## 2023-07-09 DIAGNOSIS — F17210 Nicotine dependence, cigarettes, uncomplicated: Secondary | ICD-10-CM

## 2023-07-09 DIAGNOSIS — Z87891 Personal history of nicotine dependence: Secondary | ICD-10-CM

## 2023-07-09 DIAGNOSIS — R918 Other nonspecific abnormal finding of lung field: Secondary | ICD-10-CM

## 2023-07-09 DIAGNOSIS — Z122 Encounter for screening for malignant neoplasm of respiratory organs: Secondary | ICD-10-CM

## 2023-07-09 NOTE — Telephone Encounter (Signed)
 Per Jolinda scheduled virtual appt today at 3pm to discuss Lung CT. Pt aware and can do virtual visit. She was a little unsure about doing virtual visit but believes she will be able to.

## 2023-07-09 NOTE — Telephone Encounter (Signed)
 Copied from CRM [id number]. Topic: Clinical - Lab/Test Results >> Jul 09, 2023  9:35 AM Selinda RAMAN wrote: Reason for CRM: The patient called in wanting someone to go over her CT Chest/Lung results with her. She received them on her my chart and is very concerned. She would like a nurse to call her back as soon as possible to discuss further.

## 2023-07-09 NOTE — Telephone Encounter (Signed)
 This was ordered by pulm.  Sadly, since it had not been reviewed, I had not received any results. I'm glad to take it over.  Can you double book her in as a video visit today and I will call her at lunch to review?

## 2023-07-09 NOTE — Telephone Encounter (Signed)
 Call Report from Tiffany:  IMPRESSION: 1. Lung-RADS 4B, suspicious. Additional imaging evaluation or consultation with Pulmonology or Thoracic Surgery recommended. Irregular spiculated partially cavitary solid 3.6 cm peripheral apical right upper lobe lung mass, highly suspicious for primary bronchogenic carcinoma. At least 7 additional solid pulmonary nodules scattered in the right greater than left lungs, largest 8.6 mm in the posterior right lower lobe, and including a cavitary 8 mm left lower lobe nodule, suspicious for bilateral pulmonary metastases. Mildly enlarged right paratracheal lymph node, suspicious for nodal metastasis. PET-CT recommended for further evaluation at this time. 2. Three-vessel coronary atherosclerosis. 3. Aortic Atherosclerosis (ICD10-I70.0) and Emphysema (ICD10-J43.9).

## 2023-07-09 NOTE — Telephone Encounter (Signed)
 Please review chest CT for pt.

## 2023-07-09 NOTE — Progress Notes (Signed)
 Patient could not connect to video.  I delivered the information via telephone instead.  I have arranged stat PET scan, referral to hematology/oncology and pulmonology    CT CHEST LUNG CA SCREEN LOW DOSE W/O CM Result Date: 07/06/2023 CLINICAL DATA:  68 year old female current smoker with 47 pack-year smoking history EXAM: CT CHEST WITHOUT CONTRAST LOW-DOSE FOR LUNG CANCER SCREENING TECHNIQUE: Multidetector CT imaging of the chest was performed following the standard protocol without IV contrast. RADIATION DOSE REDUCTION: This exam was performed according to the departmental dose-optimization program which includes automated exposure control, adjustment of the mA and/or kV according to patient size and/or use of iterative reconstruction technique. COMPARISON:  08/03/2022 coronary CT FINDINGS: Cardiovascular: Normal heart size. No significant pericardial effusion/thickening. Three-vessel coronary atherosclerosis. Atherosclerotic nonaneurysmal thoracic aorta. Normal caliber pulmonary arteries. Mediastinum/Nodes: No significant thyroid  nodules. Unremarkable esophagus. No axillary adenopathy. Mildly enlarged 1.3 cm right paratracheal node on series 2/image 21. No discrete hilar adenopathy on these noncontrast images. Lungs/Pleura: No pneumothorax. No pleural effusion. Mild centrilobular emphysema with diffuse bronchial wall thickening. No acute consolidative airspace disease or lung masses. Irregular spiculated partially cavitary solid 3.6 x 2.3 cm peripheral apical right upper lobe lung mass on series 4/image 85. At least 7 additional solid pulmonary nodules scattered in the right greater than left lungs, largest 8.6 mm in the posterior right lower lobe on series 4/image 164. Cavitary 8 mm posteromedial left lower lobe nodule on series 4/image 122. Upper abdomen: No acute abnormality. Musculoskeletal: No aggressive appearing focal osseous lesions. Mild-to-moderate thoracic spondylosis. IMPRESSION: 1. Lung-RADS 4B,  suspicious. Additional imaging evaluation or consultation with Pulmonology or Thoracic Surgery recommended. Irregular spiculated partially cavitary solid 3.6 cm peripheral apical right upper lobe lung mass, highly suspicious for primary bronchogenic carcinoma. At least 7 additional solid pulmonary nodules scattered in the right greater than left lungs, largest 8.6 mm in the posterior right lower lobe, and including a cavitary 8 mm left lower lobe nodule, suspicious for bilateral pulmonary metastases. Mildly enlarged right paratracheal lymph node, suspicious for nodal metastasis. PET-CT recommended for further evaluation at this time. 2. Three-vessel coronary atherosclerosis. 3. Aortic Atherosclerosis (ICD10-I70.0) and Emphysema (ICD10-J43.9). These results will be called to the ordering clinician or representative by the Radiologist Assistant, and communication documented in the PACS or [employer]. Electronically Signed   By: Selinda DELENA Blue M.D.   On: 07/06/2023 18:25    Abnormal CT lung screening - Plan: NM PET Image Initial (PI) Skull Base To Thigh (F-18 FDG), Ambulatory referral to Hematology / Oncology, Ambulatory referral to Pulmonology  Pulmonary mass - Plan: NM PET Image Initial (PI) Skull Base To Thigh (F-18 FDG), Ambulatory referral to Hematology / Oncology, Ambulatory referral to Pulmonology

## 2023-07-10 ENCOUNTER — Ambulatory Visit

## 2023-07-11 ENCOUNTER — Inpatient Hospital Stay

## 2023-07-11 ENCOUNTER — Inpatient Hospital Stay: Attending: Oncology | Admitting: Oncology

## 2023-07-11 VITALS — BP 141/66 | HR 81 | Temp 98.3°F | Resp 18 | Ht 64.0 in | Wt 173.2 lb

## 2023-07-11 DIAGNOSIS — R918 Other nonspecific abnormal finding of lung field: Secondary | ICD-10-CM | POA: Insufficient documentation

## 2023-07-11 DIAGNOSIS — C3490 Malignant neoplasm of unspecified part of unspecified bronchus or lung: Secondary | ICD-10-CM

## 2023-07-11 DIAGNOSIS — Z801 Family history of malignant neoplasm of trachea, bronchus and lung: Secondary | ICD-10-CM | POA: Diagnosis not present

## 2023-07-11 DIAGNOSIS — Z806 Family history of leukemia: Secondary | ICD-10-CM | POA: Diagnosis not present

## 2023-07-11 DIAGNOSIS — Z800 Family history of malignant neoplasm of digestive organs: Secondary | ICD-10-CM | POA: Insufficient documentation

## 2023-07-11 DIAGNOSIS — F17210 Nicotine dependence, cigarettes, uncomplicated: Secondary | ICD-10-CM | POA: Diagnosis not present

## 2023-07-11 DIAGNOSIS — Z720 Tobacco use: Secondary | ICD-10-CM | POA: Insufficient documentation

## 2023-07-11 DIAGNOSIS — C3491 Malignant neoplasm of unspecified part of right bronchus or lung: Secondary | ICD-10-CM | POA: Insufficient documentation

## 2023-07-11 NOTE — Assessment & Plan Note (Signed)
 40-year smoking history with recent cessation attempt. Smoking is a significant lung cancer risk factor.  - Encouraged smoking cessation and provided support for quitting.

## 2023-07-11 NOTE — Progress Notes (Signed)
 Hematology-Oncology Clinic Note  Heather Potter M, DO   Reason for Referral: Right lung mass  Oncology History: I have reviewed her chart and materials related to her cancer extensively and collaborated history with the patient. Summary of oncologic history is as follows:  Oncology History  Lung mass  06/26/2023 Imaging   CT chest low dose without contrast:  IMPRESSION: 1. Lung-RADS 4B, suspicious. Additional imaging evaluation or consultation with Pulmonology or Thoracic Surgery recommended. Irregular spiculated partially cavitary solid 3.6 cm peripheral apical right upper lobe lung mass, highly suspicious for primary bronchogenic carcinoma. At least 7 additional solid pulmonary nodules scattered in the right greater than left lungs, largest 8.6 mm in the posterior right lower lobe, and including a cavitary 8 mm left lower lobe nodule, suspicious for bilateral pulmonary metastases. Mildly enlarged right paratracheal lymph node, suspicious for nodal metastasis.    07/11/2023 Initial Diagnosis   Lung mass     History of Presenting Illness:  Heather [name] 68 y.o. female is here for newly diagnosed right lung mass on her lung cancer screening CT scan.  She is accompanied by her husband today.Patient has a past medical history of hyperlipidemia, hypothyroidism.  She reports occasional cough which she calls a smoker's cough.She denies shortness of breath, chest pain, weight loss, loss of appetite.  Overall, she is feeling very well.  Patient is a chronic smoker, smoking 1 pack/day for at least 40 years and just quit today.  She does not use alcohol.Her family history is significant for multiple cancers: a sister died of lung cancer, another sister died of leukemia, and her mother died of colon cancer. She has a history of polyps and undergoes regular colonoscopies.  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 [place]  her husband, son and grandson.    Medical History: Past Medical History:  Diagnosis Date   Anxiety    Arthritis    Cataract    Depression    Fibromyalgia    GERD (gastroesophageal reflux disease)    History of hiatal hernia    Hyperlipidemia    no meds currently   Hypothyroidism    PONV (postoperative nausea and vomiting)    Thyroid  disease    hypo    Surgical history: Past Surgical History:  Procedure Laterality Date   CATARACT EXTRACTION Left    CATARACT EXTRACTION W/PHACO Right 12/29/2013   Procedure: CATARACT EXTRACTION PHACO AND INTRAOCULAR LENS PLACEMENT (IOC);  Surgeon: Cherene Mania, MD;  Location: [facility];  Service: Ophthalmology;  Laterality: Right;  CDE:4.84   CHOLECYSTECTOMY N/A 02/26/2023   Procedure: LAPAROSCOPIC CHOLECYSTECTOMY;  Surgeon: Stevie Herlene Righter, MD;  Location: [facility];  Service: General;  Laterality: N/A;   COLONOSCOPY     EYE SURGERY     cleaned debris from behind eye.   FOOT FRACTURE SURGERY Left 2017   heel   KNEE SURGERY Right 2018   torn meniscus   TUBAL LIGATION     tubal ligation reversal     VAGINAL HYSTERECTOMY       Allergies:  has no known allergies.  Medications:  Current Outpatient Medications  Medication Sig Dispense Refill   aspirin  EC 81 MG tablet Take 1 tablet (81 mg total) by mouth daily. Swallow whole.     atorvastatin  (LIPITOR) 40 MG tablet Take 1 tablet (40 mg total) by mouth daily. 100 tablet 3   busPIRone  (BUSPAR ) 30 MG tablet Take 1/2 (  one-half) tablet by mouth twice daily 100 tablet 3   diclofenac  Sodium (VOLTAREN ) 1 % GEL Apply 4 g topically 4 (four) times daily. For knee pain/ arthritis (Patient taking differently: Apply 1 Application topically as needed (pain).) 400 g 0   FLUoxetine  (PROZAC ) 40 MG capsule Take 1 capsule (40 mg total) by mouth daily. 100 capsule 3   levothyroxine  (SYNTHROID ) 112 MCG tablet Take 1 tablet (112 mcg total) by mouth daily before breakfast. 100 tablet 3   omeprazole  (PRILOSEC) 40 MG  capsule Take 1 capsule (40 mg total) by mouth daily. 100 capsule 3   oxybutynin  (DITROPAN  XL) 5 MG 24 hr tablet Take 1 tablet (5 mg total) by mouth at bedtime. For bladder 100 tablet 0   triamcinolone  cream (KENALOG ) 0.1 % Apply 1 Application topically 2 (two) times daily. X7-10 days to patch on left side of face. 30 g 0   valACYclovir  (VALTREX ) 1000 MG tablet TAKE 2 TABLETS BY MOUTH TWICE DAILY FOR 1 DAY AT FEVER BLISTER ONSET 20 tablet 2   No current facility-administered medications for this visit.    Review of Systems: Constitutional: Denies fevers, chills or abnormal night sweats Eyes: Denies blurriness of vision, double vision or watery eyes Ears, nose, mouth, throat, and face: Denies mucositis or sore throat Respiratory: Denies cough, dyspnea or wheezes Cardiovascular: Denies palpitation, chest discomfort or lower extremity swelling Gastrointestinal:  Denies nausea, heartburn or change in bowel habits Skin: Denies abnormal skin rashes Lymphatics: Denies new lymphadenopathy or easy bruising Neurological:Denies numbness, tingling or new weaknesses Behavioral/Psych: Mood is stable, no new changes  All other systems were reviewed with the patient and are negative.  Physical Examination: ECOG PERFORMANCE STATUS: 0 - Asymptomatic  Vitals:   07/11/23 1117  BP: (!) 141/66  Pulse: 81  Resp: 18  Temp: 98.3 F (36.8 C)  SpO2: 97%   Filed Weights   07/11/23 1117  Weight: 173 lb 3.2 oz (78.6 kg)    GENERAL:alert, no distress and comfortable SKIN: skin color, texture, turgor are normal, no rashes or significant lesions LUNGS: clear to auscultation and percussion with normal breathing effort HEART: regular rate & rhythm and no murmurs and no lower extremity edema ABDOMEN:abdomen soft, non-tender and normal bowel sounds Musculoskeletal:no cyanosis of digits and no clubbing  PSYCH: alert & oriented x 3 with fluent speech NEURO: no focal motor/sensory deficits   Laboratory  Data: I have reviewed the data as listed Lab Results  Component Value Date   WBC 11.6 (H) 06/15/2023   HGB 13.1 06/15/2023   HCT 40.4 06/15/2023   MCV 92 06/15/2023   PLT 323 06/15/2023   Recent Labs    06/15/23 0934  NA 138  K 5.4*  CL 102  CO2 23  GLUCOSE 84  BUN 11  CREATININE 0.62  CALCIUM  10.2  PROT 6.9  ALBUMIN 4.4  AST 19  ALT 13  ALKPHOS 94  BILITOT 0.4    Radiographic Studies: I have personally reviewed the radiological images as listed and agreed with the findings in the report.  CT CHEST LUNG CA SCREEN LOW DOSE W/O CM CLINICAL DATA:  68 year old female current smoker with 47 pack-year smoking history  EXAM: CT CHEST WITHOUT CONTRAST LOW-DOSE FOR LUNG CANCER SCREENING  TECHNIQUE: Multidetector CT imaging of the chest was performed following the standard protocol without IV contrast.  RADIATION DOSE REDUCTION: This exam was performed according to the departmental dose-optimization program which includes automated exposure control, adjustment of the mA and/or kV according to  patient size and/or use of iterative reconstruction technique.  COMPARISON:  08/03/2022 coronary CT  FINDINGS: Cardiovascular: Normal heart size. No significant pericardial effusion/thickening. Three-vessel coronary atherosclerosis. Atherosclerotic nonaneurysmal thoracic aorta. Normal caliber pulmonary arteries.  Mediastinum/Nodes: No significant thyroid  nodules. Unremarkable esophagus. No axillary adenopathy. Mildly enlarged 1.3 cm right paratracheal node on series 2/image 21. No discrete hilar adenopathy on these noncontrast images.  Lungs/Pleura: No pneumothorax. No pleural effusion. Mild centrilobular emphysema with diffuse bronchial wall thickening. No acute consolidative airspace disease or lung masses. Irregular spiculated partially cavitary solid 3.6 x 2.3 cm peripheral apical right upper lobe lung mass on series 4/image 85. At least 7 additional solid pulmonary  nodules scattered in the right greater than left lungs, largest 8.6 mm in the posterior right lower lobe on series 4/image 164. Cavitary 8 mm posteromedial left lower lobe nodule on series 4/image 122.  Upper abdomen: No acute abnormality.  Musculoskeletal: No aggressive appearing focal osseous lesions. Mild-to-moderate thoracic spondylosis.  IMPRESSION: 1. Lung-RADS 4B, suspicious. Additional imaging evaluation or consultation with Pulmonology or Thoracic Surgery recommended. Irregular spiculated partially cavitary solid 3.6 cm peripheral apical right upper lobe lung mass, highly suspicious for primary bronchogenic carcinoma. At least 7 additional solid pulmonary nodules scattered in the right greater than left lungs, largest 8.6 mm in the posterior right lower lobe, and including a cavitary 8 mm left lower lobe nodule, suspicious for bilateral pulmonary metastases. Mildly enlarged right paratracheal lymph node, suspicious for nodal metastasis. PET-CT recommended for further evaluation at this time. 2. Three-vessel coronary atherosclerosis. 3. Aortic Atherosclerosis (ICD10-I70.0) and Emphysema (ICD10-J43.9).  These results will be called to the ordering clinician or representative by the Radiologist Assistant, and communication documented in the PACS or [employer].  Electronically Signed   By: Selinda DELENA Blue [name].D.   On: 07/06/2023 18:25    ASSESSMENT & PLAN:  Patient is a 68 y.o. female presenting for right lung mass  Lung mass CT scan showed a suspicious lung mass with bilateral nodules, suggestive of lung cancer, likely due to extensive smoking history.  Discussed high cancer probability and diagnostic/treatment steps.   - Order PET scan to assess disease extent and staging - Refer to Dr. Ruther for EBUS with biopsy to confirm diagnosis and staging - Discussed potential treatment options, including chemotherapy and radiation, based on biopsy results and staging. -  Will consider obtaining an MRI of the brain after confirmation of diagnosis  Return to clinic 1 week after PET scan to discuss results and further steps  Tobacco use 40-year smoking history with recent cessation attempt. Smoking is a significant lung cancer risk factor.  - Encouraged smoking cessation and provided support for quitting.   Orders Placed This Encounter  Procedures   NM PET Image Initial (PI) Skull Base To Thigh (F-18 FDG)    Standing Status:   Future    Expected Date:   07/11/2023    Expiration Date:   07/10/2024    If indicated for the ordered procedure, I authorize the administration of a radiopharmaceutical per Radiology protocol:   Yes    Preferred imaging location?:   [facility]    The total time spent in the appointment was 60 minutes encounter with patients including review of chart and various tests results, discussions about plan of care and coordination of care plan   All questions were answered. The patient knows to call the clinic with any problems, questions or concerns. No barriers to learning was detected.  Mickiel Dry, MD 6/25/20252:04 PM

## 2023-07-11 NOTE — Assessment & Plan Note (Signed)
 CT scan showed a suspicious lung mass with bilateral nodules, suggestive of lung cancer, likely due to extensive smoking history.  Discussed high cancer probability and diagnostic/treatment steps.   - Order PET scan to assess disease extent and staging - Refer to Dr. Ruther for EBUS with biopsy to confirm diagnosis and staging - Discussed potential treatment options, including chemotherapy and radiation, based on biopsy results and staging. - Will consider obtaining an MRI of the brain after confirmation of diagnosis  Return to clinic 1 week after PET scan to discuss results and further steps

## 2023-07-12 NOTE — Telephone Encounter (Signed)
 Results seen by Ruthell, NP. PET has already been scheduled and patient is being seen by oncology. We will follow for PET results.

## 2023-07-19 ENCOUNTER — Encounter (HOSPITAL_COMMUNITY)
Admission: RE | Admit: 2023-07-19 | Discharge: 2023-07-19 | Disposition: A | Discharge status: Home / Self Care | Source: Ambulatory Visit | Attending: Oncology | Admitting: Oncology

## 2023-07-19 DIAGNOSIS — C3490 Malignant neoplasm of unspecified part of unspecified bronchus or lung: Secondary | ICD-10-CM | POA: Insufficient documentation

## 2023-07-19 DIAGNOSIS — R918 Other nonspecific abnormal finding of lung field: Secondary | ICD-10-CM | POA: Diagnosis not present

## 2023-07-19 MED ORDER — FLUDEOXYGLUCOSE F - 18 (FDG) INJECTION
9.0400 | Freq: Once | INTRAVENOUS | Status: AC | PRN
  Administered 2023-07-19: 9.04 via INTRAVENOUS

## 2023-07-23 ENCOUNTER — Telehealth: Payer: Self-pay | Admitting: Family Medicine

## 2023-07-23 NOTE — Telephone Encounter (Unsigned)
 Copied from CRM [id number]. Topic: Clinical - Medical Advice >> Jul 23, 2023 10:37 AM Gustabo D wrote: Patient wants a call back from her pcp or nurse

## 2023-07-26 ENCOUNTER — Inpatient Hospital Stay: Attending: Oncology | Admitting: Oncology

## 2023-07-26 VITALS — BP 131/73 | HR 85 | Temp 98.3°F | Resp 18 | Ht 64.0 in | Wt 173.8 lb

## 2023-07-26 DIAGNOSIS — R911 Solitary pulmonary nodule: Secondary | ICD-10-CM | POA: Diagnosis not present

## 2023-07-26 DIAGNOSIS — R918 Other nonspecific abnormal finding of lung field: Secondary | ICD-10-CM | POA: Diagnosis not present

## 2023-07-26 DIAGNOSIS — F17210 Nicotine dependence, cigarettes, uncomplicated: Secondary | ICD-10-CM | POA: Insufficient documentation

## 2023-07-26 DIAGNOSIS — C3490 Malignant neoplasm of unspecified part of unspecified bronchus or lung: Secondary | ICD-10-CM | POA: Diagnosis not present

## 2023-07-26 DIAGNOSIS — Z720 Tobacco use: Secondary | ICD-10-CM

## 2023-07-26 DIAGNOSIS — F419 Anxiety disorder, unspecified: Secondary | ICD-10-CM | POA: Insufficient documentation

## 2023-07-26 MED ORDER — NICOTINE 14 MG/24HR TD PT24: 14.0000 mg | MEDICATED_PATCH | Freq: Every day | TRANSDERMAL | 0 refills | Status: DC

## 2023-07-26 MED ORDER — ZOLPIDEM TARTRATE 5 MG PO TABS: 5.0000 mg | ORAL_TABLET | Freq: Every evening | ORAL | 0 refills | Status: DC | PRN

## 2023-07-26 NOTE — Progress Notes (Signed)
 Patient Care Team: [name], DO as PCP - General (Family Medicine) Lonni Slain, MD as PCP - Cardiology (Cardiology[facility] (Ophthalmology) Ladora Ross Lacy Cohn, MD as Referring Physician (Optometry) Armbruster, Elspeth SQUIBB, MD as Consulting Physician (Gastroenterology)  Clinic Day:  07/27/2023  Referring physician: [name], DO   CHIEF COMPLAINT:  CC: Right lung mass    ASSESSMENT & PLAN:   Assessment & Plan: Heather [name]  is a 68 y.o. female with right lung mass  Lung mass CT scan showed a suspicious lung mass with bilateral nodules, suggestive of lung cancer, likely due to extensive smoking history.  Scan consistent with lung and lymphadenopathy but no evidence of distant metastatic disease. Upcoming appointment with pulmonary on 08/06/2023  - Discussed the PET scan results and potential treatment options, including chemotherapy and radiation, based on biopsy results and staging. - Will obtain MRI of brain to rule out metastatic disease in the brain  Return to clinic 1 week after EBUS with biopsy to discuss results and further management  Tobacco use 40-year smoking history with recent cessation attempt. Smoking is a significant lung cancer risk factor.  - Encouraged smoking cessation and provided support for quitting. - Patient requested for nicotine  patches and prescription provided  Anxiety Significant anxiety exacerbated by cancer diagnosis and personal losses. Current medication, Buspar , is inadequate.  - Prescribed Ambien  for sleep disturbances related to anxiety.    The patient understands the plans discussed today and is in agreement with them.  She knows to contact our office if she develops concerns prior to her next appointment.  I provided 30 minutes of face-to-face time during this encounter and > 50% was spent counseling as documented under my assessment and plan.    Mickiel Dry, MD   CANCER  CENTER [facility] [place] - A DEPT OF [facility] [address] [place] [address] Dept: [phone] Dept Fax: [phone]   Orders Placed This Encounter  Procedures   MR Brain W Wo Contrast    Standing Status:   Future    Expiration Date:   07/25/2024    If indicated for the ordered procedure, I authorize the administration of contrast media per Radiology protocol:   Yes    What is the patient's sedation requirement?:   No Sedation    Does the patient have a pacemaker or implanted devices?:   No    Use SRS Protocol?:   No    Preferred imaging location?:   [facility] (table limit - 500lbs)     ONCOLOGY HISTORY:   Oncology History  Lung mass  06/26/2023 Imaging   CT chest low dose without contrast:  IMPRESSION: 1. Lung-RADS 4B, suspicious. Additional imaging evaluation or consultation with Pulmonology or Thoracic Surgery recommended. Irregular spiculated partially cavitary solid 3.6 cm peripheral apical right upper lobe lung mass, highly suspicious for primary bronchogenic carcinoma. At least 7 additional solid pulmonary nodules scattered in the right greater than left lungs, largest 8.6 mm in the posterior right lower lobe, and including a cavitary 8 mm left lower lobe nodule, suspicious for bilateral pulmonary metastases. Mildly enlarged right paratracheal lymph node, suspicious for nodal metastasis.    07/11/2023 Initial Diagnosis   Lung mass   07/19/2023 PET scan   IMPRESSION: Dominant right apical cavitary lesion is diffusely hypermetabolic consistent with area of malignancy. There are several metastatic nodes identified involving the right hilum, thoracic inlet, supraclavicular region as well  as along the mediastinum, right greater than left. There are a few other small lung nodules bilaterally which have low-level uptake. These have a differential and could represent other areas of neoplastic disease versus infectious/inflammatory.   No specific abnormal uptake below the diaphragm        Current Treatment:  TBD  INTERVAL HISTORY:  Heather [name] is here today for follow up. Patient is accompanied by her husband. She denies fevers or chills. She denies pain. Her appetite is good.  She reports feeling well.  She experiences significant anxiety, describing it as overwhelming, especially after her diagnosis. She cries and feels anxious despite taking Buspar  for anxiety, which she finds insufficiently effective. She experiences periods of calm followed by intense emotional distress.She has a history of smoking and is attempting to quit. She has reduced her smoking but finds it challenging to quit completely. She is using nicotine  patches to aid in cessation.  We discussed the PET scan results in detail and that her cancer is likely only in the lungs and some lymph nodes but was no evidence evidence of metastasis to other organs   I have reviewed the past medical history, past surgical history, social history and family history with the patient and they are unchanged from previous note.  ALLERGIES:  has no known allergies.  MEDICATIONS:  Current Outpatient Medications  Medication Sig Dispense Refill   aspirin  EC 81 MG tablet Take 1 tablet (81 mg total) by mouth daily. Swallow whole.     atorvastatin  (LIPITOR) 40 MG tablet Take 1 tablet (40 mg total) by mouth daily. 100 tablet 3   busPIRone  (BUSPAR ) 30 MG tablet Take 1/2 (one-half) tablet by mouth twice daily 100 tablet 3   diclofenac  Sodium (VOLTAREN ) 1 % GEL Apply 4 g topically 4 (four) times daily. For knee pain/ arthritis (Patient taking differently: Apply 1 Application topically as needed (pain).) 400 g 0   FLUoxetine  (PROZAC ) 40 MG capsule Take 1 capsule (40 mg total) by mouth daily. 100 capsule 3   levothyroxine  (SYNTHROID ) 112 MCG tablet Take 1 tablet (112 mcg total) by mouth daily before breakfast. 100 tablet 3   nicotine  (NICODERM CQ  - DOSED IN MG/24 HOURS) 14  mg/24hr patch Place 1 patch (14 mg total) onto the skin daily. 28 patch 0   omeprazole  (PRILOSEC) 40 MG capsule Take 1 capsule (40 mg total) by mouth daily. 100 capsule 3   oxybutynin  (DITROPAN  XL) 5 MG 24 hr tablet Take 1 tablet (5 mg total) by mouth at bedtime. For bladder 100 tablet 0   triamcinolone  cream (KENALOG ) 0.1 % Apply 1 Application topically 2 (two) times daily. X7-10 days to patch on left side of face. 30 g 0   valACYclovir  (VALTREX ) 1000 MG tablet TAKE 2 TABLETS BY MOUTH TWICE DAILY FOR 1 DAY AT FEVER BLISTER ONSET 20 tablet 2   zolpidem  (AMBIEN ) 5 MG tablet Take 1 tablet (5 mg total) by mouth at bedtime as needed for sleep. 30 tablet 0   No current facility-administered medications for this visit.    REVIEW OF SYSTEMS:   Constitutional: Denies fevers, chills or abnormal weight loss Eyes: Denies blurriness of vision Ears, nose, mouth, throat, and face: Denies mucositis or sore throat Respiratory: Denies cough, dyspnea or wheezes Cardiovascular: Denies palpitation, chest discomfort or lower extremity swelling Gastrointestinal:  Denies nausea, heartburn or change in bowel habits Skin: Denies abnormal skin rashes Lymphatics: Denies new lymphadenopathy or easy bruising Neurological:Denies numbness, tingling or new weaknesses Behavioral/Psych: Mood  is stable, no new changes  All other systems were reviewed with the patient and are negative.   VITALS:  Blood pressure 131/73, pulse 85, temperature 98.3 F (36.8 C), temperature source Tympanic, resp. rate 18, height 5' 4 (1.626 m), weight 173 lb 12.8 oz (78.8 kg), SpO2 99%.  Wt Readings from Last 3 Encounters:  07/26/23 173 lb 12.8 oz (78.8 kg)  07/11/23 173 lb 3.2 oz (78.6 kg)  06/15/23 175 lb (79.4 kg)    Body mass index is 29.83 kg/m.  Performance status (ECOG): 1 - Symptomatic but completely ambulatory  PHYSICAL EXAM:   GENERAL:alert, no distress and comfortable SKIN: skin color, texture, turgor are normal, no  rashes or significant lesions EYES: normal, Conjunctiva are pink and non-injected, sclera clear LUNGS: clear to auscultation and percussion with normal breathing effort HEART: regular rate & rhythm and no murmurs and no lower extremity edema ABDOMEN:abdomen soft, non-tender and normal bowel sounds Musculoskeletal:no cyanosis of digits and no clubbing  NEURO: alert & oriented x 3 with fluent speech  LABORATORY DATA:  I have reviewed the data as listed    Component Value Date/Time   NA 138 06/15/2023 0934   K 5.4 (H) 06/15/2023 0934   CL 102 06/15/2023 0934   CO2 23 06/15/2023 0934   GLUCOSE 84 06/15/2023 0934   GLUCOSE 92 12/24/2013 1301   BUN 11 06/15/2023 0934   CREATININE 0.62 06/15/2023 0934   CALCIUM  10.2 06/15/2023 0934   PROT 6.9 06/15/2023 0934   ALBUMIN 4.4 06/15/2023 0934   AST 19 06/15/2023 0934   ALT 13 06/15/2023 0934   ALKPHOS 94 06/15/2023 0934   BILITOT 0.4 06/15/2023 0934   GFRNONAA 91 01/21/2020 1615   GFRAA 105 01/21/2020 1615    Lab Results  Component Value Date   WBC 11.6 (H) 06/15/2023   NEUTROABS 3.3 04/10/2017   HGB 13.1 06/15/2023   HCT 40.4 06/15/2023   MCV 92 06/15/2023   PLT 323 06/15/2023      Chemistry      Component Value Date/Time   NA 138 06/15/2023 0934   K 5.4 (H) 06/15/2023 0934   CL 102 06/15/2023 0934   CO2 23 06/15/2023 0934   BUN 11 06/15/2023 0934   CREATININE 0.62 06/15/2023 0934      Component Value Date/Time   CALCIUM  10.2 06/15/2023 0934   ALKPHOS 94 06/15/2023 0934   AST 19 06/15/2023 0934   ALT 13 06/15/2023 0934   BILITOT 0.4 06/15/2023 0934       RADIOGRAPHIC STUDIES: I have personally reviewed the radiological images as listed and agreed with the findings in the report.  NM PET Image Initial (PI) Skull Base To Thigh (F-18 FDG) Result Date: 07/20/2023 CLINICAL DATA:  Initial treatment strategy for non-small-cell lung cancer. EXAM: NUCLEAR MEDICINE PET SKULL BASE TO THIGH TECHNIQUE: 9.04 mCi F-18 FDG was  injected intravenously. Full-ring PET imaging was performed from the skull base to thigh after the radiotracer. CT data was obtained and used for attenuation correction and anatomic localization. Fasting blood glucose: 101 mg/dl COMPARISON:  Chest CT 93/89/7974. Abdomen pelvis CT with contrast 07/18/2022. FINDINGS: Mediastinal blood pool activity: SUV max 2.5 Liver activity: SUV max 2.8 NECK: There are bilateral supraclavicular nodes identified. At least 2 on the right and 1 on the left. Dominant right-sided focus has maximum SUV value of 7.1 and measures 13 x 9 mm on image 36 of series 202. Left-sided focus has maximum SUV of 4.1 and measures 9 mm on  image 37. No additional areas of abnormal uptake along lymph node chains in the neck. Visualized intracranial compartment is preserved. Incidental CT findings: Paranasal sinuses and mastoid air cells are clear. Streak artifact related to the patient's dental hardware. Scattered vascular calcifications are seen. The parotid glands, submandibular glands unremarkable. Small thyroid  gland. CHEST: On the prior CT scan there is a dominant spiculated partially cavitary right apical mass measuring 3.6 x 2.3 cm which is again seen today and has significant abnormal uptake of maximum SUV 18.2. However there are additional bilateral lung nodules which show some uptake. Foci include right lower lobe maximum SUV 4.3 measuring 5 mm on image 59. Focus in the middle lobe maximum SUV 2.9 measuring 6 mm on image 62, and middle lobe focus more caudal image 66 which is also quite small with SUV maximum 2.4. Lastly there is a slightly cavitary focus in the superior segment of the left lower lobe maximum SUV value of 3.6 and diameter on today's CT scan of 7 mm. These were seen on the previous study as well and appear grossly similar when adjusted for technique in terms of size. There are several hypermetabolic lymph nodes identified in the mediastinum. Distribution includes subcarinal,  right paratracheal, thoracic inlet bilaterally. Example includes the subcarinal node with maximum SUV of 9.3 and dimension in short axis on image 54 of 8 mm. Right paratracheal lesion with maximum SUV value of 10.5 has short axis of 12 mm on image 46. Node to left of the upper trachea has maximum SUV of 7.1 and measures 8 mm in short axis on image 41. In addition there are some hypermetabolic right hilar nodes with maximum SUV of 10.9. Incidental CT findings: Heart is nonenlarged. No pericardial effusion. Coronary artery calcifications are seen. Please correlate for other coronary risk factors. Slightly patulous thoracic esophagus. Chronic lung changes identified as on prior with ground-glass, emphysematous changes and interstitial septal thickening. Some dependent atelectasis. No pleural effusion. ABDOMEN/PELVIS: There is physiologic distribution radiotracer along the parenchymal organs, bowel and renal collecting systems. No abnormal nodal uptake. Incidental CT findings: On this noncontrast attenuation correction CT, grossly the liver, spleen, adrenal glands and pancreas are unremarkable. No abnormal calcifications seen within either kidney nor along the course of either ureter. There is some presumed vascular calcifications in the left renal hilum. Preserved contour to the urinary bladder. Diffuse vascular calcifications. Minimal ectasia of the inferior abdominal aorta measuring up to 2.9 cm. Large bowel has a normal course and caliber. Scattered colonic stool. Left-sided colonic diverticula. Normal appendix. Stomach is collapsed. SKELETON: No abnormal uptake along the visualized osseous structures. Incidental CT findings: Scattered degenerative changes. IMPRESSION: Dominant right apical cavitary lesion is diffusely hypermetabolic consistent with area of malignancy. There are several metastatic nodes identified involving the right hilum, thoracic inlet, supraclavicular region as well as along the mediastinum,  right greater than left. There are a few other small lung nodules bilaterally which have low-level uptake. These have a differential and could represent other areas of neoplastic disease versus infectious/inflammatory. Short follow-up of these lesions could be considered. No specific abnormal uptake below the diaphragm Slight ectasia of the abdominal aorta of up to 2.9 cm. Coronary artery calcifications. Chronic lung changes. Electronically Signed   By: Ranell Bring M.D.   On: 07/20/2023 11:21

## 2023-07-27 DIAGNOSIS — F419 Anxiety disorder, unspecified: Secondary | ICD-10-CM | POA: Insufficient documentation

## 2023-07-27 NOTE — Assessment & Plan Note (Signed)
 Significant anxiety exacerbated by cancer diagnosis and personal losses. Current medication, Buspar , is inadequate.  - Prescribed Ambien  for sleep disturbances related to anxiety.

## 2023-07-27 NOTE — Assessment & Plan Note (Signed)
 40-year smoking history with recent cessation attempt. Smoking is a significant lung cancer risk factor.  - Encouraged smoking cessation and provided support for quitting. - Patient requested for nicotine  patches and prescription provided

## 2023-07-27 NOTE — Assessment & Plan Note (Signed)
 CT scan showed a suspicious lung mass with bilateral nodules, suggestive of lung cancer, likely due to extensive smoking history.  Scan consistent with lung and lymphadenopathy but no evidence of distant metastatic disease. Upcoming appointment with pulmonary on 08/06/2023  - Discussed the PET scan results and potential treatment options, including chemotherapy and radiation, based on biopsy results and staging. - Will obtain MRI of brain to rule out metastatic disease in the brain  Return to clinic 1 week after EBUS with biopsy to discuss results and further management

## 2023-08-06 ENCOUNTER — Ambulatory Visit: Admitting: Acute Care

## 2023-08-06 ENCOUNTER — Telehealth: Payer: Self-pay | Admitting: Acute Care

## 2023-08-06 ENCOUNTER — Encounter: Payer: Self-pay | Admitting: Acute Care

## 2023-08-06 ENCOUNTER — Encounter: Payer: Self-pay | Admitting: Emergency Medicine

## 2023-08-06 VITALS — BP 159/74 | HR 70 | Ht 64.0 in | Wt 172.2 lb

## 2023-08-06 DIAGNOSIS — R911 Solitary pulmonary nodule: Secondary | ICD-10-CM

## 2023-08-06 DIAGNOSIS — F17210 Nicotine dependence, cigarettes, uncomplicated: Secondary | ICD-10-CM | POA: Diagnosis not present

## 2023-08-06 NOTE — Patient Instructions (Addendum)
 It is good to see you today. We have reviewed your PET scan and CT Chest. There is a right apical lung nodule that is concerning for lung cancer.  Plan will be for a biopsy. I have placed an order for a bronchoscopy with biopsies.  We have discussed the procedure in detail.  We have reviewed the risks and benefits of the procedure. These include bleeding, infection, puncture of the lung, and adverse reaction to anesthesia. You have agreed to proceed with biopsy to evaluate the lung nodule of concern.  Your procedure will be done by Dr. Lamar Chris. You will receive a letter today with date time and information pertaining to the procedure. You will need someone to drive you to the procedure, stay with you during the procedure, and stay with you after the procedure. You will also need someone to stay with you for 24 hours after anesthesia to ensure you have cleared and are doing well. You will follow-up with me 1 week after the procedure to review the results and to ensure you are doing well. Call if you need us  prior to the procedure or if you have any questions at all. Please work on quitting smoking. You can receive free nicotine  replacement therapy (patches, gum, or mints) by calling 1-800-QUIT NOW. Please call so we can get you on the path to becoming a non-smoker. I know it is hard, but you can do this!  Hypnosis for smoking cessation  [employer] [phone]  Acupuncture for smoking cessation  [employer] [phone]    Please contact office for sooner follow up if symptoms do not improve or worsen or seek emergency care     Please stop your aspirin  x 48 hours before the procedure. Last dose Friday 08/10/2023.

## 2023-08-06 NOTE — Telephone Encounter (Signed)
 Please schedule the following:  Provider performing procedure:[place]rum Diagnosis:  Lung nodules Which side if for nodule / mass?  Bilateral Procedure:  Navigational bronchoscopy with biopsies  Has patient been spoken to by Provider and given informed consent?  Yes, Lauraine Lites NP on 08/06/2023 Anesthesia:  General Do you need Fluro?  Yes Duration of procedure: 1.5 hours Date: 08/13/2023 Alternate Date: 08/14/2023  Time: 7 AM is the last one open.  Location:  [facility] Does patient have OSA? No   DM? No Or Latex allergy?  No Medication Restriction/ Anticoagulate/Antiplatelet:  Stop ASA 48 hours before procedure. Last dose 08/10/2023 Pre-op Labs Ordered:determined by Anesthesia Imaging request:   Checking with Dr. Shelah to see if we need to order another more recent scan.  (If, SuperDimension CT Chest, please have STAT courier sent to ENDO)

## 2023-08-06 NOTE — H&P (View-Only) (Signed)
 History of Present Illness Heather [name] is a 68 y.o. female current every day smoker referred 07/2023 for abnormal lung cancer screening scan.    08/06/2023 Pt. Presents for lung nodule consult for a right apical cavitary lesion noted on LDCT chest, LR 4. PET scan was done 07/19/2023 which shows this lesion is hypermetabolic and suspicious for malignancy.  Heather [name] is a 68 year old female current every day smoker who presents for evaluation and potential biopsy of several  suspicious lung nodules. She was referred by Dr. Guinevere for further evaluation of a suspicious lung nodule.  A lung cancer screening scan done 06/2023 revealed a nodule in the right apical area of her lungs. A subsequent PET scan showed the nodule to be hypermetabolic. The PET scan also showed several  nodes identified involving the right hilum, thoracic inlet, supraclavicular region as well as along the mediastinum, right greater than left, suspicious for metastatic disease.   She has no symptoms such as weight loss or hemoptysis. Her past medical history includes emphysema, which was identified during the CT scan. She is not currently using an inhaler for her breathing.  Her family history is significant for emphysema, heart disease, colon cancer in her mother, lung cancer in her sister, and leukemia in another sister. Her father and three brothers have heart problems.  Socially, she smokes about five cigarettes a day and is attempting to quit with the aid of nicotine  patches. She lives with her husband, is retired, and has adult children. In the last 3 months they have traveled to the beach.   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 She was exposed to some fumes. She worked there x 10 years.  We  discussed that the next best step is for a biopsy of the lung nodules that were positive on the PET scan. We discussed that we need tissue diagnosis to definitively determine if the nodules are cancer. We discussed the risks to include  bleeding, infection, pneumothorax and adverse reaction to anesthesia. She and her husband are both in agreement with moving forward with bronchoscopy with biopsies. They would like to do this as early as possible. She needs to hold her ASA x 48 hours. She will be scheduled for 7/28. She will need a super D CT Chest prior to the scan. This has been ordered, and I called the patient to make sure she was aware. She will follow up with me after the procedure to ensure she has done well and to review cytology results.   BP was elevated in the office today. I feel this is in relation to her anxiety regarding her CT and PET scan results. She said a recent BP was in the 130's systolic.    Test Results: PET Scan 07/19/2023, read 07/20/2023 Dominant right apical cavitary lesion is diffusely hypermetabolic consistent with area of malignancy. There are several metastatic nodes identified involving the right hilum, thoracic inlet, supraclavicular region as well as along the mediastinum, right greater than left.   There are a few other small lung nodules bilaterally which have low-level uptake. These have a differential and could represent other areas of neoplastic disease versus infectious/inflammatory. Short follow-up of these lesions could be considered.   No specific abnormal uptake below the diaphragm  Slight ectasia of the abdominal aorta of up to 2.9 cm. Coronary artery calcifications. Chronic lung changes.   LDCT 06/26/2023, read 07/06/2023 Lung-RADS 4B, suspicious. Additional imaging evaluation or consultation with Pulmonology or Thoracic Surgery recommended. Irregular spiculated partially cavitary solid 3.6 cm peripheral apical right upper lobe lung mass, highly  suspicious for primary bronchogenic carcinoma. At least 7 additional solid pulmonary nodules scattered in the right greater than left lungs, largest 8.6 mm in the posterior right lower lobe, and including a cavitary 8 mm left lower lobe nodule, suspicious for bilateral pulmonary metastases. Mildly enlarged right paratracheal lymph node, suspicious for nodal metastasis. PET-CT recommended for further evaluation at this time. 2. Three-vessel coronary atherosclerosis. 3. Aortic Atherosclerosis (ICD10-I70.0) and Emphysema (ICD10-J43.9).      Latest Ref Rng & Units 06/15/2023    9:34 AM 02/19/2023    2:05 PM 09/20/2021   12:20 PM  CBC  WBC 3.4 - 10.8 x10E3/uL 11.6  8.8  8.2   Hemoglobin 11.1 - 15.9 g/dL 86.8  87.6  86.3   Hematocrit 34.0 - 46.6 % 40.4  36.7  40.5   Platelets 150 - 450 x10E3/uL 323  324  355        Latest Ref Rng & Units 06/15/2023    9:34 AM 06/09/2022    9:31 AM 09/20/2021   12:20 PM  BMP  Glucose 70 - 99 mg/dL 84  93  88   BUN 8 - 27 mg/dL 11  10  12    Creatinine 0.57 - 1.00 mg/dL 9.37  9.41  9.37   BUN/Creat Ratio 12 - 28 18  17  19    Sodium 134 - 144 mmol/L 138  138  138   Potassium 3.5 - 5.2 mmol/L 5.4  4.4  4.3   Chloride 96 - 106 mmol/L 102  102  103   CO2 20 - 29 mmol/L 23  21  21    Calcium  8.7 - 10.3 mg/dL 89.7  9.6  89.9     BNP No results found for: BNP  ProBNP No results found for: PROBNP  PFT No results found for: FEV1PRE, FEV1POST, FVCPRE, FVCPOST, TLC, DLCOUNC, PREFEV1FVCRT, PSTFEV1FVCRT  NM PET Image Initial (PI) Skull Base To Thigh (F-18 FDG) Result Date: 07/20/2023 CLINICAL DATA:  Initial treatment strategy for non-small-cell lung cancer. EXAM: NUCLEAR MEDICINE PET SKULL BASE TO THIGH TECHNIQUE: 9.04 mCi F-18 FDG was injected intravenously. Full-ring PET imaging was performed from the skull base to thigh after the radiotracer. CT data was obtained and used for attenuation correction and anatomic localization. Fasting blood  glucose: 101 mg/dl COMPARISON:  Chest CT 93/89/7974. Abdomen pelvis CT with contrast 07/18/2022. FINDINGS: Mediastinal blood pool activity: SUV max 2.5 Liver activity: SUV max 2.8 NECK: There are bilateral supraclavicular nodes identified. At least 2 on the right and 1 on the left. Dominant right-sided focus has maximum SUV value of 7.1 and measures 13 x 9 mm on image 36 of series 202. Left-sided focus has maximum SUV of 4.1 and measures 9 mm on image 37. No additional areas of abnormal uptake along lymph node chains in the neck. Visualized intracranial compartment is preserved. Incidental CT findings: Paranasal sinuses and mastoid air cells are clear. Streak artifact related to the patient's dental hardware. Scattered vascular calcifications are seen. The parotid glands, submandibular glands unremarkable. Small thyroid  gland. CHEST: On the prior CT scan there is a dominant spiculated partially cavitary right apical mass measuring 3.6 x 2.3 cm which is again seen today and has  significant abnormal uptake of maximum SUV 18.2. However there are additional bilateral lung nodules which show some uptake. Foci include right lower lobe maximum SUV 4.3 measuring 5 mm on image 59. Focus in the middle lobe maximum SUV 2.9 measuring 6 mm on image 62, and middle lobe focus more caudal image 66 which is also quite small with SUV maximum 2.4. Lastly there is a slightly cavitary focus in the superior segment of the left lower lobe maximum SUV value of 3.6 and diameter on today's CT scan of 7 mm. These were seen on the previous study as well and appear grossly similar when adjusted for technique in terms of size. There are several hypermetabolic lymph nodes identified in the mediastinum. Distribution includes subcarinal, right paratracheal, thoracic inlet bilaterally. Example includes the subcarinal node with maximum SUV of 9.3 and dimension in short axis on image 54 of 8 mm. Right paratracheal lesion with maximum SUV value of 10.5  has short axis of 12 mm on image 46. Node to left of the upper trachea has maximum SUV of 7.1 and measures 8 mm in short axis on image 41. In addition there are some hypermetabolic right hilar nodes with maximum SUV of 10.9. Incidental CT findings: Heart is nonenlarged. No pericardial effusion. Coronary artery calcifications are seen. Please correlate for other coronary risk factors. Slightly patulous thoracic esophagus. Chronic lung changes identified as on prior with ground-glass, emphysematous changes and interstitial septal thickening. Some dependent atelectasis. No pleural effusion. ABDOMEN/PELVIS: There is physiologic distribution radiotracer along the parenchymal organs, bowel and renal collecting systems. No abnormal nodal uptake. Incidental CT findings: On this noncontrast attenuation correction CT, grossly the liver, spleen, adrenal glands and pancreas are unremarkable. No abnormal calcifications seen within either kidney nor along the course of either ureter. There is some presumed vascular calcifications in the left renal hilum. Preserved contour to the urinary bladder. Diffuse vascular calcifications. Minimal ectasia of the inferior abdominal aorta measuring up to 2.9 cm. Large bowel has a normal course and caliber. Scattered colonic stool. Left-sided colonic diverticula. Normal appendix. Stomach is collapsed. SKELETON: No abnormal uptake along the visualized osseous structures. Incidental CT findings: Scattered degenerative changes. IMPRESSION: Dominant right apical cavitary lesion is diffusely hypermetabolic consistent with area of malignancy. There are several metastatic nodes identified involving the right hilum, thoracic inlet, supraclavicular region as well as along the mediastinum, right greater than left. There are a few other small lung nodules bilaterally which have low-level uptake. These have a differential and could represent other areas of neoplastic disease versus infectious/inflammatory.  Short follow-up of these lesions could be considered. No specific abnormal uptake below the diaphragm Slight ectasia of the abdominal aorta of up to 2.9 cm. Coronary artery calcifications. Chronic lung changes. Electronically Signed   By: Ranell Bring M.D.   On: 07/20/2023 11:21     Past medical hx Past Medical History:  Diagnosis Date   Anxiety    Arthritis    Cataract    Depression    Fibromyalgia    GERD (gastroesophageal reflux disease)    History of hiatal hernia    Hyperlipidemia    no meds currently   Hypothyroidism    PONV (postoperative nausea and vomiting)    Thyroid  disease    hypo     Social History   Tobacco Use   Smoking status: Every Day    Current packs/day: 1.00    Average packs/day: 1 pack/day for 39.0 years (39.0 ttl pk-yrs)    Types: Cigarettes  Smokeless tobacco: Never   Tobacco comments:    5 cigarettes a day 08/06/2023 KRD  Vaping Use   Vaping status: Never Used  Substance Use Topics   Alcohol use: Yes    Comment: occasionally   Drug use: Never    Ms.Greenman reports that she has been smoking cigarettes. She has a 39 pack-year smoking history. She has never used smokeless tobacco. She reports current alcohol use. She reports that she does not use drugs.  Tobacco Cessation: Ready to quit: Not Answered Counseling given: Not Answered Tobacco comments: 5 cigarettes a day 08/06/2023 KRD Current every day smoker , I spent 3-4 minutes counseling patient on  steps to stop use of tobacco products. I have provided patient with information on receiving free nicotine  replacement therapy, and contact numbers for hypnosis for smoking cessation as well as acupuncture for smoking cessation.   Past surgical hx, Family hx, Social hx all reviewed.  Current Outpatient Medications on File Prior to Visit  Medication Sig   aspirin  EC 81 MG tablet Take 1 tablet (81 mg total) by mouth daily. Swallow whole.   atorvastatin  (LIPITOR) 40 MG tablet Take 1 tablet (40 mg  total) by mouth daily.   busPIRone  (BUSPAR ) 30 MG tablet Take 1/2 (one-half) tablet by mouth twice daily   diclofenac  Sodium (VOLTAREN ) 1 % GEL Apply 4 g topically 4 (four) times daily. For knee pain/ arthritis (Patient taking differently: Apply 1 Application topically as needed (pain).)   FLUoxetine  (PROZAC ) 40 MG capsule Take 1 capsule (40 mg total) by mouth daily.   levothyroxine  (SYNTHROID ) 112 MCG tablet Take 1 tablet (112 mcg total) by mouth daily before breakfast.   nicotine  (NICODERM CQ  - DOSED IN MG/24 HOURS) 14 mg/24hr patch Place 1 patch (14 mg total) onto the skin daily.   omeprazole  (PRILOSEC) 40 MG capsule Take 1 capsule (40 mg total) by mouth daily.   oxybutynin  (DITROPAN  XL) 5 MG 24 hr tablet Take 1 tablet (5 mg total) by mouth at bedtime. For bladder   triamcinolone  cream (KENALOG ) 0.1 % Apply 1 Application topically 2 (two) times daily. X7-10 days to patch on left side of face.   valACYclovir  (VALTREX ) 1000 MG tablet TAKE 2 TABLETS BY MOUTH TWICE DAILY FOR 1 DAY AT FEVER BLISTER ONSET   zolpidem  (AMBIEN ) 5 MG tablet Take 1 tablet (5 mg total) by mouth at bedtime as needed for sleep.   No current facility-administered medications on file prior to visit.     No Known Allergies  Review Of Systems:  Constitutional:   No  weight loss, night sweats,  Fevers, chills, fatigue, or  lassitude.  HEENT:   No headaches,  Difficulty swallowing,  Tooth/dental problems, or  Sore throat,                No sneezing, itching, ear ache, nasal congestion, post nasal drip,   CV:  No chest pain,  Orthopnea, PND, swelling in lower extremities, anasarca, dizziness, palpitations, syncope.   GI  No heartburn, indigestion, abdominal pain, nausea, vomiting, diarrhea, change in bowel habits, loss of appetite, bloody stools.   Resp: No shortness of breath with exertion or at rest.  No excess mucus, no productive cough,  No non-productive cough,  No coughing up of blood.  No change in color of mucus.   No wheezing.  No chest wall deformity  Skin: no rash or lesions.  GU: no dysuria, change in color of urine, no urgency or frequency.  No flank pain, no  hematuria   MS:  No joint pain or swelling.  No decreased range of motion.  No back pain.  Psych:  No change in mood or affect. No depression or anxiety.  No memory loss.   Vital Signs BP (!) 159/74 (BP Location: Left Arm, Patient Position: Sitting, Cuff Size: Normal)   Pulse 70   Ht 5' 4 (1.626 m)   Wt 172 lb 3.2 oz (78.1 kg)   SpO2 97%   BMI 29.56 kg/m    Physical Exam:  General- No distress,  A&Ox3, pleasant  ENT: No sinus tenderness, TM clear, pale nasal mucosa, no oral exudate,no post nasal drip, no LAN Cardiac: S1, S2, regular rate and rhythm, no murmur Chest: No wheeze/ rales/ dullness; no accessory muscle use, no nasal flaring, no sternal retractions Abd.: Soft Non-tender, ND, BS +, Body mass index is 29.56 kg/m.  Ext: No clubbing cyanosis, edema, no obvious deformities Neuro:  normal strength, MAE x 4, A&O x 3, appropriate Skin: No rashes, warm and dry, no obvious skin lesions Psych: normal mood and behavior   Assessment/Plan Abnormal lung cancer screening  Current every day smoker with a 39 pack year smoking history Abnormal PET scan concerning for malignancy Plan We have reviewed your PET scan and CT Chest. There is a right apical lung nodule that is concerning for lung cancer.  Plan will be for a biopsy. I have placed an order for a bronchoscopy with biopsies.  We have discussed the procedure in detail.  We have reviewed the risks and benefits of the procedure. These include bleeding, infection, puncture of the lung, and adverse reaction to anesthesia. You have agreed to proceed with biopsy to evaluate the lung nodule of concern.  Your procedure will be done by Dr. Lamar Chris. You will receive a letter today with date time and information pertaining to the procedure. You will need someone to drive you  to the procedure, stay with you during the procedure, and stay with you after the procedure. You will also need someone to stay with you for 24 hours after anesthesia to ensure you have cleared and are doing well. You will follow-up with me 1 week after the procedure to review the results and to ensure you are doing well. Call if you need us  prior to the procedure or if you have any questions at all. Please work on quitting smoking. You can receive free nicotine  replacement therapy (patches, gum, or mints) by calling 1-800-QUIT NOW. Please call so we can get you on the path to becoming a non-smoker. I know it is hard, but you can do this!  Hypnosis for smoking cessation  [employer] [phone]  Acupuncture for smoking cessation  [employer] [phone]    Please contact office for sooner follow up if symptoms do not improve or worsen or seek emergency care     Please stop your aspirin  x 48 hours before the procedure. Last dose Friday 08/10/2023.  I spent 40 minutes dedicated to the care of this patient on the date of this encounter to include pre-visit review of records, face-to-face time with the patient discussing conditions above, post visit ordering of testing, clinical documentation with the electronic health record, making appropriate referrals as documented, and communicating necessary information to the patient's healthcare team.   Lauraine JULIANNA Lites, NP 08/06/2023  11:03 AM

## 2023-08-06 NOTE — Progress Notes (Signed)
 History of Present Illness Heather [name] is a 68 y.o. female current every day smoker referred 07/2023 for abnormal lung cancer screening scan.    08/06/2023 Pt. Presents for lung nodule consult for a right apical cavitary lesion noted on LDCT chest, LR 4. PET scan was done 07/19/2023 which shows this lesion is hypermetabolic and suspicious for malignancy.  Heather [name] is a 68 year old female current every day smoker who presents for evaluation and potential biopsy of several  suspicious lung nodules. She was referred by Dr. Guinevere for further evaluation of a suspicious lung nodule.  A lung cancer screening scan done 06/2023 revealed a nodule in the right apical area of her lungs. A subsequent PET scan showed the nodule to be hypermetabolic. The PET scan also showed several  nodes identified involving the right hilum, thoracic inlet, supraclavicular region as well as along the mediastinum, right greater than left, suspicious for metastatic disease.   She has no symptoms such as weight loss or hemoptysis. Her past medical history includes emphysema, which was identified during the CT scan. She is not currently using an inhaler for her breathing.  Her family history is significant for emphysema, heart disease, colon cancer in her mother, lung cancer in her sister, and leukemia in another sister. Her father and three brothers have heart problems.  Socially, she smokes about five cigarettes a day and is attempting to quit with the aid of nicotine  patches. She lives with her husband, is retired, and has adult children. In the last 3 months they have traveled to the beach.   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 She was exposed to some fumes. She worked there x 10 years.  We  discussed that the next best step is for a biopsy of the lung nodules that were positive on the PET scan. We discussed that we need tissue diagnosis to definitively determine if the nodules are cancer. We discussed the risks to include  bleeding, infection, pneumothorax and adverse reaction to anesthesia. She and her husband are both in agreement with moving forward with bronchoscopy with biopsies. They would like to do this as early as possible. She needs to hold her ASA x 48 hours. She will be scheduled for 7/28. She will need a super D CT Chest prior to the scan. This has been ordered, and I called the patient to make sure she was aware. She will follow up with me after the procedure to ensure she has done well and to review cytology results.   BP was elevated in the office today. I feel this is in relation to her anxiety regarding her CT and PET scan results. She said a recent BP was in the 130's systolic.    Test Results: PET Scan 07/19/2023, read 07/20/2023 Dominant right apical cavitary lesion is diffusely hypermetabolic consistent with area of malignancy. There are several metastatic nodes identified involving the right hilum, thoracic inlet, supraclavicular region as well as along the mediastinum, right greater than left.   There are a few other small lung nodules bilaterally which have low-level uptake. These have a differential and could represent other areas of neoplastic disease versus infectious/inflammatory. Short follow-up of these lesions could be considered.   No specific abnormal uptake below the diaphragm  Slight ectasia of the abdominal aorta of up to 2.9 cm. Coronary artery calcifications. Chronic lung changes.   LDCT 06/26/2023, read 07/06/2023 Lung-RADS 4B, suspicious. Additional imaging evaluation or consultation with Pulmonology or Thoracic Surgery recommended. Irregular spiculated partially cavitary solid 3.6 cm peripheral apical right upper lobe lung mass, highly  suspicious for primary bronchogenic carcinoma. At least 7 additional solid pulmonary nodules scattered in the right greater than left lungs, largest 8.6 mm in the posterior right lower lobe, and including a cavitary 8 mm left lower lobe nodule, suspicious for bilateral pulmonary metastases. Mildly enlarged right paratracheal lymph node, suspicious for nodal metastasis. PET-CT recommended for further evaluation at this time. 2. Three-vessel coronary atherosclerosis. 3. Aortic Atherosclerosis (ICD10-I70.0) and Emphysema (ICD10-J43.9).      Latest Ref Rng & Units 06/15/2023    9:34 AM 02/19/2023    2:05 PM 09/20/2021   12:20 PM  CBC  WBC 3.4 - 10.8 x10E3/uL 11.6  8.8  8.2   Hemoglobin 11.1 - 15.9 g/dL 86.8  87.6  86.3   Hematocrit 34.0 - 46.6 % 40.4  36.7  40.5   Platelets 150 - 450 x10E3/uL 323  324  355        Latest Ref Rng & Units 06/15/2023    9:34 AM 06/09/2022    9:31 AM 09/20/2021   12:20 PM  BMP  Glucose 70 - 99 mg/dL 84  93  88   BUN 8 - 27 mg/dL 11  10  12    Creatinine 0.57 - 1.00 mg/dL 9.37  9.41  9.37   BUN/Creat Ratio 12 - 28 18  17  19    Sodium 134 - 144 mmol/L 138  138  138   Potassium 3.5 - 5.2 mmol/L 5.4  4.4  4.3   Chloride 96 - 106 mmol/L 102  102  103   CO2 20 - 29 mmol/L 23  21  21    Calcium  8.7 - 10.3 mg/dL 89.7  9.6  89.9     BNP No results found for: BNP  ProBNP No results found for: PROBNP  PFT No results found for: FEV1PRE, FEV1POST, FVCPRE, FVCPOST, TLC, DLCOUNC, PREFEV1FVCRT, PSTFEV1FVCRT  NM PET Image Initial (PI) Skull Base To Thigh (F-18 FDG) Result Date: 07/20/2023 CLINICAL DATA:  Initial treatment strategy for non-small-cell lung cancer. EXAM: NUCLEAR MEDICINE PET SKULL BASE TO THIGH TECHNIQUE: 9.04 mCi F-18 FDG was injected intravenously. Full-ring PET imaging was performed from the skull base to thigh after the radiotracer. CT data was obtained and used for attenuation correction and anatomic localization. Fasting blood  glucose: 101 mg/dl COMPARISON:  Chest CT 93/89/7974. Abdomen pelvis CT with contrast 07/18/2022. FINDINGS: Mediastinal blood pool activity: SUV max 2.5 Liver activity: SUV max 2.8 NECK: There are bilateral supraclavicular nodes identified. At least 2 on the right and 1 on the left. Dominant right-sided focus has maximum SUV value of 7.1 and measures 13 x 9 mm on image 36 of series 202. Left-sided focus has maximum SUV of 4.1 and measures 9 mm on image 37. No additional areas of abnormal uptake along lymph node chains in the neck. Visualized intracranial compartment is preserved. Incidental CT findings: Paranasal sinuses and mastoid air cells are clear. Streak artifact related to the patient's dental hardware. Scattered vascular calcifications are seen. The parotid glands, submandibular glands unremarkable. Small thyroid  gland. CHEST: On the prior CT scan there is a dominant spiculated partially cavitary right apical mass measuring 3.6 x 2.3 cm which is again seen today and has  significant abnormal uptake of maximum SUV 18.2. However there are additional bilateral lung nodules which show some uptake. Foci include right lower lobe maximum SUV 4.3 measuring 5 mm on image 59. Focus in the middle lobe maximum SUV 2.9 measuring 6 mm on image 62, and middle lobe focus more caudal image 66 which is also quite small with SUV maximum 2.4. Lastly there is a slightly cavitary focus in the superior segment of the left lower lobe maximum SUV value of 3.6 and diameter on today's CT scan of 7 mm. These were seen on the previous study as well and appear grossly similar when adjusted for technique in terms of size. There are several hypermetabolic lymph nodes identified in the mediastinum. Distribution includes subcarinal, right paratracheal, thoracic inlet bilaterally. Example includes the subcarinal node with maximum SUV of 9.3 and dimension in short axis on image 54 of 8 mm. Right paratracheal lesion with maximum SUV value of 10.5  has short axis of 12 mm on image 46. Node to left of the upper trachea has maximum SUV of 7.1 and measures 8 mm in short axis on image 41. In addition there are some hypermetabolic right hilar nodes with maximum SUV of 10.9. Incidental CT findings: Heart is nonenlarged. No pericardial effusion. Coronary artery calcifications are seen. Please correlate for other coronary risk factors. Slightly patulous thoracic esophagus. Chronic lung changes identified as on prior with ground-glass, emphysematous changes and interstitial septal thickening. Some dependent atelectasis. No pleural effusion. ABDOMEN/PELVIS: There is physiologic distribution radiotracer along the parenchymal organs, bowel and renal collecting systems. No abnormal nodal uptake. Incidental CT findings: On this noncontrast attenuation correction CT, grossly the liver, spleen, adrenal glands and pancreas are unremarkable. No abnormal calcifications seen within either kidney nor along the course of either ureter. There is some presumed vascular calcifications in the left renal hilum. Preserved contour to the urinary bladder. Diffuse vascular calcifications. Minimal ectasia of the inferior abdominal aorta measuring up to 2.9 cm. Large bowel has a normal course and caliber. Scattered colonic stool. Left-sided colonic diverticula. Normal appendix. Stomach is collapsed. SKELETON: No abnormal uptake along the visualized osseous structures. Incidental CT findings: Scattered degenerative changes. IMPRESSION: Dominant right apical cavitary lesion is diffusely hypermetabolic consistent with area of malignancy. There are several metastatic nodes identified involving the right hilum, thoracic inlet, supraclavicular region as well as along the mediastinum, right greater than left. There are a few other small lung nodules bilaterally which have low-level uptake. These have a differential and could represent other areas of neoplastic disease versus infectious/inflammatory.  Short follow-up of these lesions could be considered. No specific abnormal uptake below the diaphragm Slight ectasia of the abdominal aorta of up to 2.9 cm. Coronary artery calcifications. Chronic lung changes. Electronically Signed   By: Ranell Bring M.D.   On: 07/20/2023 11:21     Past medical hx Past Medical History:  Diagnosis Date   Anxiety    Arthritis    Cataract    Depression    Fibromyalgia    GERD (gastroesophageal reflux disease)    History of hiatal hernia    Hyperlipidemia    no meds currently   Hypothyroidism    PONV (postoperative nausea and vomiting)    Thyroid  disease    hypo     Social History   Tobacco Use   Smoking status: Every Day    Current packs/day: 1.00    Average packs/day: 1 pack/day for 39.0 years (39.0 ttl pk-yrs)    Types: Cigarettes  Smokeless tobacco: Never   Tobacco comments:    5 cigarettes a day 08/06/2023 KRD  Vaping Use   Vaping status: Never Used  Substance Use Topics   Alcohol use: Yes    Comment: occasionally   Drug use: Never    Ms.Greenman reports that she has been smoking cigarettes. She has a 39 pack-year smoking history. She has never used smokeless tobacco. She reports current alcohol use. She reports that she does not use drugs.  Tobacco Cessation: Ready to quit: Not Answered Counseling given: Not Answered Tobacco comments: 5 cigarettes a day 08/06/2023 KRD Current every day smoker , I spent 3-4 minutes counseling patient on  steps to stop use of tobacco products. I have provided patient with information on receiving free nicotine  replacement therapy, and contact numbers for hypnosis for smoking cessation as well as acupuncture for smoking cessation.   Past surgical hx, Family hx, Social hx all reviewed.  Current Outpatient Medications on File Prior to Visit  Medication Sig   aspirin  EC 81 MG tablet Take 1 tablet (81 mg total) by mouth daily. Swallow whole.   atorvastatin  (LIPITOR) 40 MG tablet Take 1 tablet (40 mg  total) by mouth daily.   busPIRone  (BUSPAR ) 30 MG tablet Take 1/2 (one-half) tablet by mouth twice daily   diclofenac  Sodium (VOLTAREN ) 1 % GEL Apply 4 g topically 4 (four) times daily. For knee pain/ arthritis (Patient taking differently: Apply 1 Application topically as needed (pain).)   FLUoxetine  (PROZAC ) 40 MG capsule Take 1 capsule (40 mg total) by mouth daily.   levothyroxine  (SYNTHROID ) 112 MCG tablet Take 1 tablet (112 mcg total) by mouth daily before breakfast.   nicotine  (NICODERM CQ  - DOSED IN MG/24 HOURS) 14 mg/24hr patch Place 1 patch (14 mg total) onto the skin daily.   omeprazole  (PRILOSEC) 40 MG capsule Take 1 capsule (40 mg total) by mouth daily.   oxybutynin  (DITROPAN  XL) 5 MG 24 hr tablet Take 1 tablet (5 mg total) by mouth at bedtime. For bladder   triamcinolone  cream (KENALOG ) 0.1 % Apply 1 Application topically 2 (two) times daily. X7-10 days to patch on left side of face.   valACYclovir  (VALTREX ) 1000 MG tablet TAKE 2 TABLETS BY MOUTH TWICE DAILY FOR 1 DAY AT FEVER BLISTER ONSET   zolpidem  (AMBIEN ) 5 MG tablet Take 1 tablet (5 mg total) by mouth at bedtime as needed for sleep.   No current facility-administered medications on file prior to visit.     No Known Allergies  Review Of Systems:  Constitutional:   No  weight loss, night sweats,  Fevers, chills, fatigue, or  lassitude.  HEENT:   No headaches,  Difficulty swallowing,  Tooth/dental problems, or  Sore throat,                No sneezing, itching, ear ache, nasal congestion, post nasal drip,   CV:  No chest pain,  Orthopnea, PND, swelling in lower extremities, anasarca, dizziness, palpitations, syncope.   GI  No heartburn, indigestion, abdominal pain, nausea, vomiting, diarrhea, change in bowel habits, loss of appetite, bloody stools.   Resp: No shortness of breath with exertion or at rest.  No excess mucus, no productive cough,  No non-productive cough,  No coughing up of blood.  No change in color of mucus.   No wheezing.  No chest wall deformity  Skin: no rash or lesions.  GU: no dysuria, change in color of urine, no urgency or frequency.  No flank pain, no  hematuria   MS:  No joint pain or swelling.  No decreased range of motion.  No back pain.  Psych:  No change in mood or affect. No depression or anxiety.  No memory loss.   Vital Signs BP (!) 159/74 (BP Location: Left Arm, Patient Position: Sitting, Cuff Size: Normal)   Pulse 70   Ht 5' 4 (1.626 m)   Wt 172 lb 3.2 oz (78.1 kg)   SpO2 97%   BMI 29.56 kg/m    Physical Exam:  General- No distress,  A&Ox3, pleasant  ENT: No sinus tenderness, TM clear, pale nasal mucosa, no oral exudate,no post nasal drip, no LAN Cardiac: S1, S2, regular rate and rhythm, no murmur Chest: No wheeze/ rales/ dullness; no accessory muscle use, no nasal flaring, no sternal retractions Abd.: Soft Non-tender, ND, BS +, Body mass index is 29.56 kg/m.  Ext: No clubbing cyanosis, edema, no obvious deformities Neuro:  normal strength, MAE x 4, A&O x 3, appropriate Skin: No rashes, warm and dry, no obvious skin lesions Psych: normal mood and behavior   Assessment/Plan Abnormal lung cancer screening  Current every day smoker with a 39 pack year smoking history Abnormal PET scan concerning for malignancy Plan We have reviewed your PET scan and CT Chest. There is a right apical lung nodule that is concerning for lung cancer.  Plan will be for a biopsy. I have placed an order for a bronchoscopy with biopsies.  We have discussed the procedure in detail.  We have reviewed the risks and benefits of the procedure. These include bleeding, infection, puncture of the lung, and adverse reaction to anesthesia. You have agreed to proceed with biopsy to evaluate the lung nodule of concern.  Your procedure will be done by Dr. Lamar Chris. You will receive a letter today with date time and information pertaining to the procedure. You will need someone to drive you  to the procedure, stay with you during the procedure, and stay with you after the procedure. You will also need someone to stay with you for 24 hours after anesthesia to ensure you have cleared and are doing well. You will follow-up with me 1 week after the procedure to review the results and to ensure you are doing well. Call if you need us  prior to the procedure or if you have any questions at all. Please work on quitting smoking. You can receive free nicotine  replacement therapy (patches, gum, or mints) by calling 1-800-QUIT NOW. Please call so we can get you on the path to becoming a non-smoker. I know it is hard, but you can do this!  Hypnosis for smoking cessation  [employer] [phone]  Acupuncture for smoking cessation  [employer] [phone]    Please contact office for sooner follow up if symptoms do not improve or worsen or seek emergency care     Please stop your aspirin  x 48 hours before the procedure. Last dose Friday 08/10/2023.  I spent 40 minutes dedicated to the care of this patient on the date of this encounter to include pre-visit review of records, face-to-face time with the patient discussing conditions above, post visit ordering of testing, clinical documentation with the electronic health record, making appropriate referrals as documented, and communicating necessary information to the patient's healthcare team.   Lauraine JULIANNA Lites, NP 08/06/2023  11:03 AM

## 2023-08-06 NOTE — Telephone Encounter (Signed)
 Letter given by [facility] Case# [id number]

## 2023-08-07 ENCOUNTER — Ambulatory Visit (HOSPITAL_COMMUNITY)
Admission: RE | Admit: 2023-08-07 | Discharge: 2023-08-07 | Disposition: A | Discharge status: Home / Self Care | Source: Ambulatory Visit | Attending: Oncology | Admitting: Oncology

## 2023-08-07 DIAGNOSIS — C3490 Malignant neoplasm of unspecified part of unspecified bronchus or lung: Secondary | ICD-10-CM | POA: Diagnosis not present

## 2023-08-07 MED ORDER — GADOBUTROL 1 MMOL/ML IV SOLN
7.5000 mL | Freq: Once | INTRAVENOUS | Status: AC | PRN
  Administered 2023-08-07: 7.5 mL via INTRAVENOUS

## 2023-08-08 ENCOUNTER — Ambulatory Visit (HOSPITAL_COMMUNITY)
Admission: RE | Admit: 2023-08-08 | Discharge: 2023-08-08 | Disposition: A | Discharge status: Home / Self Care | Source: Ambulatory Visit | Attending: Acute Care | Admitting: Acute Care

## 2023-08-08 DIAGNOSIS — R911 Solitary pulmonary nodule: Secondary | ICD-10-CM | POA: Diagnosis not present

## 2023-08-08 DIAGNOSIS — R918 Other nonspecific abnormal finding of lung field: Secondary | ICD-10-CM | POA: Diagnosis not present

## 2023-08-08 DIAGNOSIS — J432 Centrilobular emphysema: Secondary | ICD-10-CM | POA: Diagnosis not present

## 2023-08-08 DIAGNOSIS — J8410 Pulmonary fibrosis, unspecified: Secondary | ICD-10-CM | POA: Diagnosis not present

## 2023-08-09 ENCOUNTER — Encounter (HOSPITAL_COMMUNITY): Payer: Self-pay | Admitting: Emergency Medicine

## 2023-08-09 ENCOUNTER — Other Ambulatory Visit: Payer: Self-pay

## 2023-08-09 NOTE — Progress Notes (Signed)
 SDW CALL  Patient was given pre-op instructions over the phone. The opportunity was given for the patient to ask questions. No further questions asked. Patient verbalized understanding of instructions given.   PCP - Norene Fielding Cardiologist - Dr. Shelda Bruckner - last seen was August 2024; had telephone visit 02/12/23  PPM/ICD - denies   Chest x-ray -  EKG - 08/18/22 Stress Test - denies ECHO - denies Cardiac Cath - denies  Sleep Study - denies  No DM  Last dose of GLP1 agonist-  n/a GLP1 instructions:  n/a  Blood Thinner Instructions: n/a Aspirin  Instructions: n/a  ERAS Protcol -  NPO PRE-SURGERY Ensure or G2- n/a  COVID TEST- no   Anesthesia review:  no  Patient denies shortness of breath, fever, cough and chest pain over the phone call   All instructions explained to the patient, with a verbal understanding of the material. Patient agrees to go over the instructions while at home for a better understanding.

## 2023-08-12 NOTE — Anesthesia Preprocedure Evaluation (Signed)
 Anesthesia Evaluation  Patient identified by MRN, date of birth, ID band Patient awake    Reviewed: Allergy & Precautions, NPO status , Patient's Chart, lab work & pertinent test results  History of Anesthesia Complications (+) PONV and history of anesthetic complications  Airway Mallampati: II  TM Distance: >3 FB Neck ROM: Full    Dental  (+) Dental Advisory Given   Pulmonary neg recent URI, Current Smoker and Patient abstained from smoking.   Pulmonary exam normal breath sounds clear to auscultation       Cardiovascular + CAD  Normal cardiovascular exam Rhythm:Regular Rate:Normal  CT Coronary Ca++ Coronary arteries: Normal origins. Coronary Calcium  Score: Left main: 0 Left anterior descending artery: 51.8 Left circumflex artery: 61.9 Right coronary artery: 51.1 Total: 165 Percentile: 84% Pericardium: Normal. Ascending Aorta: Normal caliber. Non-cardiac: See separate report from [facility].   IMPRESSION: Coronary calcium  score of 165. This was 84% percentile for age-, race-, and sex-matched controls.    Neuro/Psych  PSYCHIATRIC DISORDERS Anxiety Depression     Neuromuscular disease    GI/Hepatic Neg liver ROS, hiatal hernia,GERD  Medicated and Controlled,,  Endo/Other  Hypothyroidism    Renal/GU negative Renal ROS     Musculoskeletal  (+) Arthritis , Osteoarthritis,  Fibromyalgia -  Abdominal   Peds  Hematology negative hematology ROS (+)   Anesthesia Other Findings   Reproductive/Obstetrics                              Anesthesia Physical Anesthesia Plan  ASA: 3  Anesthesia Plan: General   Post-op Pain Management: Minimal or no pain anticipated   Induction: Intravenous  PONV Risk Score and Plan: 4 or greater and Ondansetron , Treatment may vary due to age or medical condition and Dexamethasone   Airway Management Planned: Oral ETT  Additional Equipment:  None  Intra-op Plan:   Post-operative Plan: Extubation in OR  Informed Consent: I have reviewed the patients History and Physical, chart, labs and discussed the procedure including the risks, benefits and alternatives for the proposed anesthesia with the patient or authorized representative who has indicated his/her understanding and acceptance.     Dental advisory given  Plan Discussed with: CRNA and Anesthesiologist  Anesthesia Plan Comments:          Anesthesia Quick Evaluation

## 2023-08-13 ENCOUNTER — Encounter (HOSPITAL_COMMUNITY)
Admission: RE | Disposition: A | Discharge status: Home / Self Care | Payer: Self-pay | Source: Home / Self Care | Attending: Emergency Medicine

## 2023-08-13 ENCOUNTER — Encounter (HOSPITAL_COMMUNITY): Payer: Self-pay | Admitting: Emergency Medicine

## 2023-08-13 ENCOUNTER — Ambulatory Visit (HOSPITAL_COMMUNITY): Payer: Self-pay | Admitting: Anesthesiology

## 2023-08-13 ENCOUNTER — Ambulatory Visit (HOSPITAL_COMMUNITY)

## 2023-08-13 ENCOUNTER — Ambulatory Visit (HOSPITAL_COMMUNITY)
Admission: RE | Admit: 2023-08-13 | Discharge: 2023-08-13 | Disposition: A | Discharge status: Home / Self Care | Attending: Emergency Medicine | Admitting: Emergency Medicine

## 2023-08-13 DIAGNOSIS — Z801 Family history of malignant neoplasm of trachea, bronchus and lung: Secondary | ICD-10-CM | POA: Insufficient documentation

## 2023-08-13 DIAGNOSIS — C771 Secondary and unspecified malignant neoplasm of intrathoracic lymph nodes: Secondary | ICD-10-CM | POA: Insufficient documentation

## 2023-08-13 DIAGNOSIS — R918 Other nonspecific abnormal finding of lung field: Secondary | ICD-10-CM

## 2023-08-13 DIAGNOSIS — K219 Gastro-esophageal reflux disease without esophagitis: Secondary | ICD-10-CM | POA: Diagnosis not present

## 2023-08-13 DIAGNOSIS — R590 Localized enlarged lymph nodes: Secondary | ICD-10-CM | POA: Diagnosis not present

## 2023-08-13 DIAGNOSIS — F17210 Nicotine dependence, cigarettes, uncomplicated: Secondary | ICD-10-CM | POA: Insufficient documentation

## 2023-08-13 DIAGNOSIS — M1990 Unspecified osteoarthritis, unspecified site: Secondary | ICD-10-CM | POA: Insufficient documentation

## 2023-08-13 DIAGNOSIS — Z48813 Encounter for surgical aftercare following surgery on the respiratory system: Secondary | ICD-10-CM | POA: Diagnosis not present

## 2023-08-13 DIAGNOSIS — I2510 Atherosclerotic heart disease of native coronary artery without angina pectoris: Secondary | ICD-10-CM | POA: Diagnosis not present

## 2023-08-13 DIAGNOSIS — C3411 Malignant neoplasm of upper lobe, right bronchus or lung: Secondary | ICD-10-CM | POA: Insufficient documentation

## 2023-08-13 DIAGNOSIS — R911 Solitary pulmonary nodule: Secondary | ICD-10-CM | POA: Diagnosis present

## 2023-08-13 DIAGNOSIS — M797 Fibromyalgia: Secondary | ICD-10-CM | POA: Diagnosis not present

## 2023-08-13 DIAGNOSIS — E039 Hypothyroidism, unspecified: Secondary | ICD-10-CM | POA: Diagnosis not present

## 2023-08-13 DIAGNOSIS — J439 Emphysema, unspecified: Secondary | ICD-10-CM | POA: Insufficient documentation

## 2023-08-13 DIAGNOSIS — M170 Bilateral primary osteoarthritis of knee: Secondary | ICD-10-CM

## 2023-08-13 DIAGNOSIS — C801 Malignant (primary) neoplasm, unspecified: Secondary | ICD-10-CM | POA: Diagnosis not present

## 2023-08-13 HISTORY — PX: VIDEO BRONCHOSCOPY WITH ENDOBRONCHIAL NAVIGATION: SHX6175

## 2023-08-13 HISTORY — PX: BRONCHIAL NEEDLE ASPIRATION BIOPSY: SHX5106

## 2023-08-13 HISTORY — PX: BRONCHIAL BIOPSY: SHX5109

## 2023-08-13 HISTORY — PX: VIDEO BRONCHOSCOPY WITH ENDOBRONCHIAL ULTRASOUND: SHX6177

## 2023-08-13 LAB — CBC
HCT: 38.8 % (ref 36.0–46.0)
Hemoglobin: 13 g/dL (ref 12.0–15.0)
MCH: 30.2 pg (ref 26.0–34.0)
MCHC: 33.5 g/dL (ref 30.0–36.0)
MCV: 90.2 fL (ref 80.0–100.0)
Platelets: 291 K/uL (ref 150–400)
RBC: 4.3 MIL/uL (ref 3.87–5.11)
RDW: 12.6 % (ref 11.5–15.5)
WBC: 7.6 K/uL (ref 4.0–10.5)
nRBC: 0 % (ref 0.0–0.2)

## 2023-08-13 SURGERY — VIDEO BRONCHOSCOPY WITH ENDOBRONCHIAL NAVIGATION
Anesthesia: General | Laterality: Bilateral

## 2023-08-13 MED ORDER — CHLORHEXIDINE GLUCONATE 0.12 % MT SOLN: 15.0000 mL | Freq: Once | OROMUCOSAL | Status: AC

## 2023-08-13 MED ORDER — ROCURONIUM BROMIDE 10 MG/ML (PF) SYRINGE
PREFILLED_SYRINGE | INTRAVENOUS | Status: DC | PRN
  Administered 2023-08-13: 10 mg via INTRAVENOUS
  Administered 2023-08-13: 60 mg via INTRAVENOUS

## 2023-08-13 MED ORDER — MEPERIDINE HCL 25 MG/ML IJ SOLN: 6.2500 mg | INTRAMUSCULAR | Status: DC | PRN

## 2023-08-13 MED ORDER — FENTANYL CITRATE (PF) 100 MCG/2ML IJ SOLN: 25.0000 ug | INTRAMUSCULAR | Status: DC | PRN

## 2023-08-13 MED ORDER — ONDANSETRON HCL 4 MG/2ML IJ SOLN: 4.0000 mg | Freq: Once | INTRAMUSCULAR | Status: DC | PRN

## 2023-08-13 MED ORDER — SUGAMMADEX SODIUM 200 MG/2ML IV SOLN
INTRAVENOUS | Status: DC | PRN
  Administered 2023-08-13: 200 mg via INTRAVENOUS

## 2023-08-13 MED ORDER — LIDOCAINE 2% (20 MG/ML) 5 ML SYRINGE
INTRAMUSCULAR | Status: DC | PRN
  Administered 2023-08-13: 10 mg via INTRAVENOUS
  Administered 2023-08-13: 60 mg via INTRAVENOUS

## 2023-08-13 MED ORDER — OXYCODONE HCL 5 MG/5ML PO SOLN: 5.0000 mg | Freq: Once | ORAL | Status: DC | PRN

## 2023-08-13 MED ORDER — CHLORHEXIDINE GLUCONATE 0.12 % MT SOLN
OROMUCOSAL | Status: AC
  Administered 2023-08-13: 15 mL via OROMUCOSAL
  Filled 2023-08-13: qty 15

## 2023-08-13 MED ORDER — LACTATED RINGERS IV SOLN: INTRAVENOUS | Status: DC

## 2023-08-13 MED ORDER — DICLOFENAC SODIUM 1 % EX GEL: 1.0000 | CUTANEOUS | Status: AC | PRN

## 2023-08-13 MED ORDER — FENTANYL CITRATE (PF) 100 MCG/2ML IJ SOLN
INTRAMUSCULAR | Status: AC
  Filled 2023-08-13: qty 2

## 2023-08-13 MED ORDER — OXYCODONE HCL 5 MG PO TABS: 5.0000 mg | ORAL_TABLET | Freq: Once | ORAL | Status: DC | PRN

## 2023-08-13 MED ORDER — EPHEDRINE SULFATE-NACL 50-0.9 MG/10ML-% IV SOSY
PREFILLED_SYRINGE | INTRAVENOUS | Status: DC | PRN
  Administered 2023-08-13: 10 mg via INTRAVENOUS

## 2023-08-13 MED ORDER — PROPOFOL 10 MG/ML IV BOLUS
INTRAVENOUS | Status: DC | PRN
Start: 2023-08-13 — End: 2023-08-13
  Administered 2023-08-13: 170 mg via INTRAVENOUS
  Administered 2023-08-13: 20 mg via INTRAVENOUS

## 2023-08-13 MED ORDER — DEXAMETHASONE SODIUM PHOSPHATE 10 MG/ML IJ SOLN
INTRAMUSCULAR | Status: DC | PRN
  Administered 2023-08-13: 5 mg via INTRAVENOUS

## 2023-08-13 MED ORDER — ONDANSETRON HCL 4 MG/2ML IJ SOLN
INTRAMUSCULAR | Status: DC | PRN
  Administered 2023-08-13: 4 mg via INTRAVENOUS

## 2023-08-13 SURGICAL SUPPLY — 36 items
ADAPTER BRONCHOSCOPE OLYMPUS (ADAPTER) ×2 IMPLANT
ADAPTER VALVE BIOPSY EBUS (MISCELLANEOUS) IMPLANT
BAG COUNTER SPONGE SURGICOUNT (BAG) ×2 IMPLANT
BRUSH CYTOL CELLEBRITY 1.5X140 (MISCELLANEOUS) ×2 IMPLANT
BRUSH SUPERTRAX BIOPSY (INSTRUMENTS) IMPLANT
BRUSH SUPERTRAX NDL-TIP CYTO (INSTRUMENTS) ×2 IMPLANT
CANISTER SUCTION 3000ML PPV (SUCTIONS) ×2 IMPLANT
CNTNR URN SCR LID CUP LEK RST (MISCELLANEOUS) ×2 IMPLANT
COVER BACK TABLE 60X90IN (DRAPES) ×2 IMPLANT
FILTER STRAW FLUID ASPIR (MISCELLANEOUS) IMPLANT
FORCEPS BIOP 1.5 SINGLE USE (MISCELLANEOUS) ×2 IMPLANT
FORCEPS BIOP SUPERTRX PREMAR (INSTRUMENTS) ×2 IMPLANT
GAUZE SPONGE 4X4 12PLY STRL (GAUZE/BANDAGES/DRESSINGS) ×2 IMPLANT
GLOVE BIO SURGEON STRL SZ7.5 (GLOVE) ×4 IMPLANT
GOWN STRL REUS W/ TWL LRG LVL3 (GOWN DISPOSABLE) ×4 IMPLANT
KIT CLEAN ENDO COMPLIANCE (KITS) ×2 IMPLANT
KIT LOCATABLE GUIDE (CANNULA) IMPLANT
KIT MARKER FIDUCIAL DELIVERY (KITS) IMPLANT
KIT TURNOVER KIT B (KITS) ×2 IMPLANT
MARKER SKIN DUAL TIP RULER LAB (MISCELLANEOUS) ×2 IMPLANT
NDL SUPERTRX PREMARK BIOPSY (NEEDLE) ×2 IMPLANT
NEEDLE SUPERTRX PREMARK BIOPSY (NEEDLE) ×2 IMPLANT
NS IRRIG 1000ML POUR BTL (IV SOLUTION) ×2 IMPLANT
OIL SILICONE PENTAX (PARTS (SERVICE/REPAIRS)) ×2 IMPLANT
PAD ARMBOARD POSITIONER FOAM (MISCELLANEOUS) ×4 IMPLANT
PATCHES PATIENT (LABEL) ×6 IMPLANT
SYR 20ML ECCENTRIC (SYRINGE) ×2 IMPLANT
SYR 20ML LL LF (SYRINGE) ×2 IMPLANT
SYR 50ML SLIP (SYRINGE) ×2 IMPLANT
TOWEL GREEN STERILE FF (TOWEL DISPOSABLE) ×2 IMPLANT
TRAP SPECIMEN MUCUS 40CC (MISCELLANEOUS) IMPLANT
TUBE CONNECTING 20X1/4 (TUBING) ×2 IMPLANT
UNDERPAD 30X36 HEAVY ABSORB (UNDERPADS AND DIAPERS) ×2 IMPLANT
VALVE BIOPSY SINGLE USE (MISCELLANEOUS) ×2 IMPLANT
VALVE SUCTION BRONCHIO DISP (MISCELLANEOUS) ×2 IMPLANT
WATER STERILE IRR 1000ML POUR (IV SOLUTION) ×2 IMPLANT

## 2023-08-13 NOTE — Interval H&P Note (Signed)
 History and Physical Interval Note:  08/13/2023 7:24 AM  Heather [name]  has presented today for surgery, with the diagnosis of lung nodules.  The various methods of treatment have been discussed with the patient and family. After consideration of risks, benefits and other options for treatment, the patient has consented to  Procedure(s): VIDEO BRONCHOSCOPY WITH ENDOBRONCHIAL NAVIGATION (Bilateral) and ENDOBRONCHIAL ULTRASOUND as a surgical intervention.  The patient's history has been reviewed, patient examined, no change in status, stable for surgery.  I have reviewed the patient's chart and labs.  Questions were answered to the patient's satisfaction.     [name]

## 2023-08-13 NOTE — Transfer of Care (Signed)
 Immediate Anesthesia Transfer of Care Note  Patient: Heather [name]  Procedure(s) Performed: VIDEO BRONCHOSCOPY WITH ENDOBRONCHIAL NAVIGATION (Bilateral) BRONCHOSCOPY, WITH NEEDLE ASPIRATION BIOPSY BRONCHOSCOPY, WITH BIOPSY BRONCHOSCOPY, WITH EBUS  Patient Location: PACU  Anesthesia Type:General  Level of Consciousness: awake, alert , and oriented  Airway & Oxygen Therapy: Patient Spontanous Breathing and Patient connected to face mask oxygen  Post-op Assessment: Report given to RN and Post -op Vital signs reviewed and stable  Post vital signs: Reviewed and stable  Last Vitals:  Vitals Value Taken Time  BP 113/57 08/13/23 09:15  Temp    Pulse 81 08/13/23 09:16  Resp 16 08/13/23 09:16  SpO2 95 % 08/13/23 09:16  Vitals shown include unfiled device data.  Last Pain:  Vitals:   08/13/23 0903  TempSrc:   PainSc: 0-No pain         Complications: No notable events documented.

## 2023-08-13 NOTE — Anesthesia Postprocedure Evaluation (Signed)
 Anesthesia Post Note  Patient: Heather [name]  Procedure(s) Performed: VIDEO BRONCHOSCOPY WITH ENDOBRONCHIAL NAVIGATION (Bilateral) BRONCHOSCOPY, WITH NEEDLE ASPIRATION BIOPSY BRONCHOSCOPY, WITH BIOPSY BRONCHOSCOPY, WITH EBUS     Patient location during evaluation: PACU Anesthesia Type: General Level of consciousness: awake and alert Pain management: pain level controlled Vital Signs Assessment: post-procedure vital signs reviewed and stable Respiratory status: spontaneous breathing, nonlabored ventilation, respiratory function stable and patient connected to nasal cannula oxygen Cardiovascular status: blood pressure returned to baseline and stable Postop Assessment: no apparent nausea or vomiting Anesthetic complications: no   No notable events documented.  Last Vitals:  Vitals:   08/13/23 0903 08/13/23 0915  BP: (!) 131/56 (!) 113/57  Pulse: 92 81  Resp: 16 12  Temp: 36.9 C   SpO2: 98% 95%    Last Pain:  Vitals:   08/13/23 0903  TempSrc:   PainSc: 0-No pain                 [name]

## 2023-08-13 NOTE — Discharge Instructions (Signed)
 Flexible Bronchoscopy, Care After This sheet gives you information about how to care for yourself after your test. Your doctor may also give you more specific instructions. If you have problems or questions, contact your doctor. Follow these instructions at home: Eating and drinking When you are wide awake, your numbness is gone and your cough and gag reflexes have come back, you may: Start eating only soft foods. Slowly drink liquids. Six hours after the test, go back to your normal diet. Driving Do not drive for 24 hours if you were given a medicine to help you relax (sedative). Do not drive or use heavy machinery while taking prescription pain medicine. General instructions Take over-the-counter and prescription medicines only as told by your doctor. Return to your normal activities as told. Ask what activities are safe for you. Do not use any products that have nicotine  or tobacco in them. This includes cigarettes and e-cigarettes. If you need help quitting, ask your doctor. Keep all follow-up visits as told by your doctor. This is important. It is very important if you had a tissue sample (biopsy) taken. Get help right away if: You have shortness of breath that gets worse. You get Blattner-headed. You feel like you are going to pass out (faint). You have chest pain. You cough up: More than a little blood. More blood than before. Summary Do not use cigarettes. Do not use e-cigarettes. Seek care in the Emergency Department right away if you have chest pain or shortness of breath. Call or MyChart Message our office for any questions or problems at [phone].  OK to restart aspirin  on 08/14/2023   This information is not intended to replace advice given to you by your health care provider. Make sure you discuss any questions you have with your health care provider.

## 2023-08-13 NOTE — Op Note (Signed)
 Procedure Note  Patient: Heather [name]  [employer] Cios mobile C-arm was utilized to identify and biopsy right upper lobe mass.  Needle-in-lesion was confirmed using real-time Cios imaging, and images were uploaded to PACS.   Lamar Chris, MD, PhD 08/13/2023, 10:59 AM [facility] [phone] or if no answer before 7:00PM call [phone] For any issues after 7:00PM please call eLink [phone]

## 2023-08-13 NOTE — Anesthesia Procedure Notes (Signed)
 Procedure Name: Intubation Date/Time: 08/13/2023 7:42 AM  Performed by: Dianne Burnard RAMAN, CRNAPre-anesthesia Checklist: Patient identified, Emergency Drugs available, Suction available and Patient being monitored Patient Re-evaluated:Patient Re-evaluated prior to induction Oxygen Delivery Method: Circle system utilized Preoxygenation: Pre-oxygenation with 100% oxygen Induction Type: IV induction Ventilation: Mask ventilation without difficulty Laryngoscope Size: McGrath Grade View: Grade I Tube type: Oral Tube size: 8.5 mm Number of attempts: 1 Airway Equipment and Method: Stylet and Oral airway Placement Confirmation: ETT inserted through vocal cords under direct vision, positive ETCO2 and breath sounds checked- equal and bilateral Secured at: 20 cm Tube secured with: Tape Dental Injury: Teeth and Oropharynx as per pre-operative assessment

## 2023-08-13 NOTE — Op Note (Signed)
 Video Bronchoscopy with Endobronchial Ultrasound and Electromagnetic Navigation Procedure Note  Date of Operation: 08/13/2023  Pre-op Diagnosis: Right upper lobe mass, mediastinal adenopathy  Post-op Diagnosis: Same  Surgeon: LAMAR CHRIS  Assistants: None  Anesthesia: General endotracheal anesthesia  Operation: Flexible video fiberoptic bronchoscopy with endobronchial ultrasound, robotic assisted navigation and biopsies.  Estimated Blood Loss: Minimal  Complications: None apparent  Indications and History: Heather [name] is a 68 y.o. female with history of tobacco use.  She was found to have a right upper lobe mass opacity on screening CT scan of the chest.  A subsequent PET scan showed hypermetabolism in this area as well as mediastinal adenopathy.  Recommendation was made to achieve a tissue diagnosis using endobronchial ultrasound and robotic assisted navigational bronchoscopy. The risks, benefits, complications, treatment options and expected outcomes were discussed with the patient.  The possibilities of pneumothorax, pneumonia, reaction to medication, pulmonary aspiration, perforation of a viscus, bleeding, failure to diagnose a condition and creating a complication requiring transfusion or operation were discussed with the patient who freely signed the consent.    Description of Procedure: The patient was seen in the Preoperative Area, was examined and was deemed appropriate to proceed.  The patient was taken to [facility] room 3, identified as [name] and the procedure verified as Flexible Video Fiberoptic Bronchoscopy with robotic assisted navigation and endobronchial ultrasound.  A Time Out was held and the above information confirmed.   Robotic assisted navigation: Prior to the date of the procedure a high-resolution CT scan of the chest was performed. Utilizing ION software program a virtual tracheobronchial tree was generated to allow the creation of distinct navigation  pathways to the patient's parenchymal abnormalities. After being taken to the operating room general anesthesia was initiated and the patient  was orally intubated. The video fiberoptic bronchoscope was introduced via the endotracheal tube and a general inspection was performed which showed normal right and left lung anatomy. Aspiration of the bilateral mainstems was completed to remove any remaining secretions. Robotic catheter inserted into patient's endotracheal tube.   Target #1 right upper lobe mass: The distinct navigation pathways prepared prior to this procedure were then utilized to navigate to patient's lesion identified on CT scan. The robotic catheter was secured into place and the vision probe was withdrawn.  Lesion location was approximated using fluoroscopy.  Local registration and targeting was performed using [employer] Cios mobile C-arm three-dimensional imaging. Under fluoroscopic guidance transbronchial needle biopsies and transbronchial forceps biopsies were performed to be sent for cytology and pathology.  Catheter-in-lesion was confirmed using Cios mobile C-arm.  Needle was identified adjacent to the mass and angle was readjusted.     Endobronchial ultrasound: The robotic scope was then withdrawn and the endobronchial ultrasound was used to identify and characterize the peritracheal, hilar and bronchial lymph nodes. Inspection showed enlargement at station 7 and station 4R.  There was also a slightly enlarged node at station 10R. Using real-time ultrasound guidance Wang needle biopsies were take from Station 4R, 7 nodes and were sent for cytology.   At the end of the procedure a general airway inspection was performed and there was no evidence of active bleeding. The bronchoscope was removed.  The patient tolerated the procedure well. There was no significant blood loss and there were no obvious complications. A post-procedural chest x-ray is pending.  Samples Target  #1: 1. Transbronchial Wang needle biopsies from right upper lobe mass 2. Transbronchial forceps biopsies from right upper lobe mass  EBUS Samples: 1. Wang needle biopsies from 4R node 2. Wang needle biopsies from 7 node  Other samples: 1.  Foundation 1 liquid test for molecular analysis   Lamar Chris, MD, PhD 08/13/2023, 8:54 AM [facility]

## 2023-08-14 ENCOUNTER — Encounter (HOSPITAL_COMMUNITY): Payer: Self-pay | Admitting: Emergency Medicine

## 2023-08-15 LAB — CYTOLOGY - NON PAP

## 2023-08-20 ENCOUNTER — Ambulatory Visit: Admitting: Acute Care

## 2023-08-20 ENCOUNTER — Encounter: Payer: Self-pay | Admitting: Acute Care

## 2023-08-20 VITALS — BP 145/81 | HR 69 | Temp 97.5°F | Ht 64.0 in | Wt 172.0 lb

## 2023-08-20 DIAGNOSIS — R942 Abnormal results of pulmonary function studies: Secondary | ICD-10-CM

## 2023-08-20 DIAGNOSIS — C3411 Malignant neoplasm of upper lobe, right bronchus or lung: Secondary | ICD-10-CM

## 2023-08-20 DIAGNOSIS — F17211 Nicotine dependence, cigarettes, in remission: Secondary | ICD-10-CM | POA: Diagnosis not present

## 2023-08-20 DIAGNOSIS — Z9889 Other specified postprocedural states: Secondary | ICD-10-CM

## 2023-08-20 DIAGNOSIS — J449 Chronic obstructive pulmonary disease, unspecified: Secondary | ICD-10-CM

## 2023-08-20 DIAGNOSIS — Z720 Tobacco use: Secondary | ICD-10-CM

## 2023-08-20 NOTE — Progress Notes (Signed)
 History of Present Illness Heather [name] is a 68 y.o. female current every day smoker referred 07/2023 for abnormal lung cancer screening scan.    08/20/2023 Discussed the use of AI scribe software for clinical note transcription with the patient, who gave verbal consent to proceed.  History of Present Illness  Pt. Presents for follow up after bronchoscopy with biopsies.She was referred for an abnormal lung cancer screening, 4B that was followed with a PET scan which showed a right apical lesion with a SUV of 18.2, highly suspicious for malignancy. There were also several suspected metastatic lymph nodes  involving the right hilum, thoracic inlet, supraclavicular region as well as along the mediastinum.   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  She states she did well after the procedure.  No bleeding, fever, discolored secretions, worsening dyspnea, or adverse reaction to anesthesia.  She was referred to Pulmonary by medical oncology. She has follow up with Dr. Davonna this week to discuss treatment options. There is enough tissue for molecular testing per the pathology report.  We also reviewed the results of the MR Brain. There was no evidence of metastatic disease, which is good news.  Heather [name] quit smoking 12 days ago. I am so proud of her. She has the number for free nicotine  patches should she need them.     Test Results: FINAL MICROSCOPIC DIAGNOSIS:  A. LUNG, RUL, NEEDLE ASPIRATION:  - Non-small cell carcinoma, favor squamous cell carcinoma.  B. LYMPH NODE, 4R, FINE NEEDLE ASPIRATION:  - Positive for malignancy.  - Metastatic non-small cell carcinoma is present.  - See concurrent case [id number].   C. LYMPH NODE, 7, FINE NEEDLE ASPIRATION:  - Positive for malignancy.  - Metastatic non-small  cell carcinoma is present.  - See concurrent case [phone].   MR Brain 08/07/2023   IMPRESSION: There are several small T2 hyperintensities within the cerebral white matter. These abnormalities are nonspecific and of uncertain etiology or clinical significance. They are more common in older patients and patients with risk factors for vascular disease.   No evidence of metastatic disease.        Latest Ref Rng & Units 08/13/2023    6:25 AM 06/15/2023    9:34 AM 02/19/2023    2:05 PM  CBC  WBC 4.0 - 10.5 K/uL 7.6  11.6  8.8   Hemoglobin 12.0 - 15.0 g/dL 86.9  86.8  87.6   Hematocrit 36.0 - 46.0 % 38.8  40.4  36.7   Platelets 150 - 400 K/uL 291  323  324        Latest Ref Rng & Units 06/15/2023    9:34 AM 06/09/2022    9:31 AM 09/20/2021   12:20 PM  BMP  Glucose 70 - 99 mg/dL 84  93  88   BUN 8 - 27 mg/dL 11  10  12    Creatinine 0.57 - 1.00 mg/dL 9.37  9.41  9.37   BUN/Creat Ratio 12 - 28 18  17  19    Sodium 134 - 144 mmol/L 138  138  138   Potassium 3.5 - 5.2 mmol/L 5.4  4.4  4.3   Chloride 96 - 106 mmol/L 102  102  103   CO2 20 - 29 mmol/L 23  21  21  Calcium  8.7 - 10.3 mg/dL 89.7  9.6  89.9     BNP No results found for: BNP  ProBNP No results found for: PROBNP  PFT No results found for: FEV1PRE, FEV1POST, FVCPRE, FVCPOST, TLC, DLCOUNC, PREFEV1FVCRT, PSTFEV1FVCRT  MR Brain W Wo Contrast Result Date: 08/13/2023 CLINICAL DATA:  Lung cancer EXAM: MRI HEAD WITHOUT AND WITH CONTRAST TECHNIQUE: Multiplanar, multiecho pulse sequences of the brain and surrounding structures were obtained without and with intravenous contrast. CONTRAST:  7.5mL GADAVIST  GADOBUTROL  1 MMOL/ML IV SOLN COMPARISON:  None Available. FINDINGS: MRI brain: There are several foci of T2 hyperintensity in the cerebral white matter. These do not have restricted diffusion. There is no abnormal enhancement. There is no acute or chronic infarct. The ventricles are normal. No mass lesion.  There are normal flow signals in the carotid arteries and basilar artery. No significant bone marrow signal abnormality. No significant abnormality in the paranasal sinuses or soft tissues. IMPRESSION: There are several small T2 hyperintensities within the cerebral white matter. These abnormalities are nonspecific and of uncertain etiology or clinical significance. They are more common in older patients and patients with risk factors for vascular disease. No evidence of metastatic disease. Electronically Signed   By: Nancyann Burns M.D.   On: 08/13/2023 11:21   DG Chest Port 1 View Result Date: 08/13/2023 CLINICAL DATA:  Status post bronchoscopy EXAM: PORTABLE CHEST 1 VIEW COMPARISON:  CT chest dated 08/08/2023, chest radiograph dated 01/13/2014 FINDINGS: Normal lung volumes. Known right upper lobe mass. No pleural effusion or pneumothorax. The heart size and mediastinal contours are within normal limits. No acute osseous abnormality. IMPRESSION: Known right upper lobe mass. No pneumothorax. Electronically Signed   By: Limin  Xu M.D.   On: 08/13/2023 10:23   DG C-ARM BRONCHOSCOPY Result Date: 08/13/2023 C-ARM BRONCHOSCOPY: Fluoroscopy was utilized by the requesting physician.  No radiographic interpretation.   CT SUPER D CHEST WO CONTRAST Result Date: 08/13/2023 CLINICAL DATA:  Cavitary lung mass * Tracking Code: BO * EXAM: CT CHEST WITHOUT CONTRAST TECHNIQUE: Multidetector CT imaging of the chest was performed using thin slice collimation for electromagnetic bronchoscopy planning purposes, without intravenous contrast. RADIATION DOSE REDUCTION: This exam was performed according to the departmental dose-optimization program which includes automated exposure control, adjustment of the mA and/or kV according to patient size and/or use of iterative reconstruction technique. COMPARISON:  PET-CT, 07/19/2023 FINDINGS: Cardiovascular: Aortic atherosclerosis. Normal heart size. Three-vessel coronary artery  calcifications. No pericardial effusion. Mediastinum/Nodes: Redemonstrated enlarged mediastinal and right hilar lymph nodes, previously FDG avid, index pretracheal node measuring up to 1.9 x 1.0 cm (series 2, image 48). Thyroid  gland, trachea, and esophagus demonstrate no significant findings. Lungs/Pleura: Spiculated, partially cavitary mass of the peripheral right apex measures 3.8 x 2.4 cm (series 4, image 41). Multiple bilateral pulmonary nodules are unchanged, for example 0.9 x 0.7 cm nodule in the right middle lobe (series 4, image 70) and a partially cavitary subpleural nodule of the dependent superior segment left lower lobe measuring 0.8 x 0.8 cm (series 4, image 71). Mild predominantly centrilobular emphysema and underlying pulmonary fibrosis in a pattern with apical to basal gradient, featuring irregular peripheral interstitial opacity and ground-glass. No pleural effusion or pneumothorax. Upper Abdomen: No acute abnormality.  Status post cholecystectomy Musculoskeletal: No chest wall abnormality. No acute osseous findings. IMPRESSION: 1. Spiculated, partially cavitary and FDG avid mass of the peripheral right apex measures 3.8 x 2.4 cm, consistent with primary bronchogenic malignancy. 2. Multiple bilateral pulmonary nodules are unchanged, including a  partially cavitary subpleural nodule of the dependent superior segment left lower lobe. 3. Redemonstrated enlarged mediastinal and right hilar lymph nodes, previously FDG avid, consistent with nodal metastatic disease. 4. 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 5. Coronary artery disease. Aortic Atherosclerosis (ICD10-I70.0) and Emphysema (ICD10-J43.9). Electronically Signed   By: Marolyn JONETTA Jaksch M.D.   On: 08/13/2023 07:33     Past medical hx Past  Medical History:  Diagnosis Date   Anxiety    Arthritis    Cataract    Depression    Fibromyalgia    GERD (gastroesophageal reflux disease)    History of hiatal hernia    Hyperlipidemia    no meds currently   Hypothyroidism    PONV (postoperative nausea and vomiting)    Thyroid  disease    hypo     Social History   Tobacco Use   Smoking status: Former    Current packs/day: 0.00    Average packs/day: 1 pack/day for 39.0 years (39.0 ttl pk-yrs)    Types: Cigarettes    Quit date: 08/08/2023    Years since quitting: 0.0    Passive exposure: Past   Smokeless tobacco: Never  Vaping Use   Vaping status: Never Used  Substance Use Topics   Alcohol use: Not Currently    Comment: occasionally   Drug use: Never    Heather [name] reports that she quit smoking 12 days ago. Her smoking use included cigarettes. She has a 39 pack-year smoking history. She has been exposed to tobacco smoke. She has never used smokeless tobacco. She reports that she does not currently use alcohol. She reports that she does not use drugs.  Tobacco Cessation: Counseling given: Not Answered Quit 12 days ago.  We reiterated the importance of maintaining smoking cessation.  Past surgical hx, Family hx, Social hx all reviewed.  Current Outpatient Medications on File Prior to Visit  Medication Sig   Ascorbic Acid (VITAMIN C) 100 MG tablet Take 100 mg by mouth daily.   aspirin  EC 81 MG tablet Take 1 tablet (81 mg total) by mouth daily. Swallow whole.   atorvastatin  (LIPITOR) 40 MG tablet Take 1 tablet (40 mg total) by mouth daily.   busPIRone  (BUSPAR ) 30 MG tablet Take 1/2 (one-half) tablet by mouth twice daily   Cholecalciferol (VITAMIN D -3) 25 MCG (1000 UT) CAPS Take by mouth.   diclofenac  Sodium (VOLTAREN ) 1 % GEL Apply 1 Application topically as needed (pain).   FLUoxetine  (PROZAC ) 40 MG capsule Take 1 capsule (40 mg total) by mouth daily.   levothyroxine  (SYNTHROID ) 112 MCG tablet Take 1 tablet (112 mcg total)  by mouth daily before breakfast.   nicotine  (NICODERM CQ  - DOSED IN MG/24 HOURS) 14 mg/24hr patch Place 1 patch (14 mg total) onto the skin daily.   omeprazole  (PRILOSEC) 40 MG capsule Take 1 capsule (40 mg total) by mouth daily.   oxybutynin  (DITROPAN  XL) 5 MG 24 hr tablet Take 1 tablet (5 mg total) by mouth at bedtime. For bladder   triamcinolone  cream (KENALOG ) 0.1 % Apply 1 Application topically 2 (two) times daily. X7-10 days to patch on left side of face.   valACYclovir  (VALTREX ) 1000 MG tablet TAKE 2 TABLETS BY MOUTH TWICE DAILY FOR 1 DAY AT FEVER BLISTER ONSET   zolpidem  (AMBIEN ) 5 MG tablet Take 1 tablet (5 mg total) by mouth at bedtime as needed for  sleep.   No current facility-administered medications on file prior to visit.     No Known Allergies  Review Of Systems:  Constitutional:   No  weight loss, night sweats,  Fevers, chills, fatigue, or  lassitude.  HEENT:   No headaches,  Difficulty swallowing,  Tooth/dental problems, or  Sore throat,                No sneezing, itching, ear ache, nasal congestion, post nasal drip,   CV:  No chest pain,  Orthopnea, PND, swelling in lower extremities, anasarca, dizziness, palpitations, syncope.   GI  No heartburn, indigestion, abdominal pain, nausea, vomiting, diarrhea, change in bowel habits, loss of appetite, bloody stools.   Resp: + baseline  shortness of breath with exertion or at rest.  No excess mucus, no productive cough,  No non-productive cough,  No coughing up of blood.  No change in color of mucus.  No wheezing.  No chest wall deformity  Skin: no rash or lesions.  GU: no dysuria, change in color of urine, no urgency or frequency.  No flank pain, no hematuria   MS:  No joint pain or swelling.  No decreased range of motion.  No back pain.  Psych:  No change in mood or affect. No depression or anxiety.  No memory loss.   Vital Signs BP (!) 145/81   Pulse 69   Temp (!) 97.5 F (36.4 C) (Temporal)   Ht 5' 4 (1.626 m)    Wt 172 lb (78 kg)   SpO2 96%   BMI 29.52 kg/m    Physical Exam:  General- No distress,  A&Ox3, pleasant ENT: No sinus tenderness, TM clear, pale nasal mucosa, no oral exudate,no post nasal drip, no LAN Cardiac: S1, S2, regular rate and rhythm, no murmur Chest: No wheeze/ rales/ dullness; no accessory muscle use, no nasal flaring, no sternal retractions Abd.: Soft Non-tender, ND, BS +, Body mass index is 29.52 kg/m.  Ext: No clubbing cyanosis, edema, no obvious lesions Neuro:  normal strength, MAE x 4, A&O x 3, appropriate Skin: No rashes, warm and dry, NO obvious skin lesions  Psych: normal mood and behavior  Assessment & Plan New diagnosis non small cell  cancer right upper lobe lung lesion ,  4R and #7 lymph nodes, favor squamous non-small cell Post navigational bronchoscopy and EBUS  with biopsies Quit smoking 12 days ago Plan I am glad you have done well after the procedure. 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 This was in your right upper lobe lung nodule as well as two lymph nodes. You have follow up with Dr. Davonna this week. She will go over treatment options with you. She will order  testing for genetic and molecular markers to see if immune therapy is an option for treatment. Radiation may be an option for treatment , if it is, you have said you would prefer [facility] vs [facility] for this. You will get great care at the cancer center.  Call if you need us  sooner. Please contact office for sooner follow up if symptoms do not improve or worsen or seek emergency care     I spent 35 minutes dedicated to the care of this patient on the date of this encounter to include pre-visit review of records, face-to-face time with the patient discussing conditions above, post visit ordering of testing, clinical documentation with the electronic health record, making appropriate referrals as documented, and communicating necessary information to  the patient's  healthcare team.     Lauraine JULIANNA Lites, NP 08/20/2023  9:18 AM

## 2023-08-20 NOTE — Patient Instructions (Addendum)
 It is good to see you today. I am glad you have done well after the procedure. 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 This was in your right upper lobe lung nodule as well as two lymph nodes. You have follow up with Dr. Davonna this week. She will go over treatment options with you. She will order  testing for genetic and molecular markers to see if immune therapy is an option for treatment. Radiation may be an option for treatment , if it is, you have said you would prefer [facility] vs [facility] for this. You will get great care at the cancer center.  Call if you need us  sooner. Please contact office for sooner follow up if symptoms do not improve or worsen or seek emergency care

## 2023-08-23 ENCOUNTER — Other Ambulatory Visit: Payer: Self-pay

## 2023-08-23 ENCOUNTER — Inpatient Hospital Stay: Attending: Oncology | Admitting: Oncology

## 2023-08-23 ENCOUNTER — Inpatient Hospital Stay

## 2023-08-23 VITALS — BP 140/68 | HR 73 | Temp 98.1°F | Resp 16 | Wt 175.0 lb

## 2023-08-23 DIAGNOSIS — Z87891 Personal history of nicotine dependence: Secondary | ICD-10-CM | POA: Diagnosis not present

## 2023-08-23 DIAGNOSIS — Z79899 Other long term (current) drug therapy: Secondary | ICD-10-CM | POA: Insufficient documentation

## 2023-08-23 DIAGNOSIS — Z720 Tobacco use: Secondary | ICD-10-CM | POA: Diagnosis not present

## 2023-08-23 DIAGNOSIS — C3491 Malignant neoplasm of unspecified part of right bronchus or lung: Secondary | ICD-10-CM | POA: Diagnosis not present

## 2023-08-23 DIAGNOSIS — C3490 Malignant neoplasm of unspecified part of unspecified bronchus or lung: Secondary | ICD-10-CM

## 2023-08-23 DIAGNOSIS — F419 Anxiety disorder, unspecified: Secondary | ICD-10-CM | POA: Insufficient documentation

## 2023-08-23 NOTE — Assessment & Plan Note (Addendum)
 Significant anxiety exacerbated by cancer diagnosis and personal losses. Current medication, Buspar , is inadequate.  - Prescribed Ambien  for sleep disturbances related to anxiety.

## 2023-08-23 NOTE — Assessment & Plan Note (Addendum)
 40-year smoking history with recent cessation attempt.  Patient quit smoking on 08/08/2023  - Encouraged attempts to abstain from smoking

## 2023-08-23 NOTE — Assessment & Plan Note (Addendum)
 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  Considering the extent of the disease and bilateral nature of, chemoRT is not an option for the patient.  Recommended systemic chemoimmunotherapy - Will send Guardant360 today and PD-L1 testing on the tissue.  If there is more tissue, will consider Caris testing - Discussed that patient would require chemoimmunotherapy based on the results and the treatment would be with a palliative intent. - Will refer for port placement today.  Discussed risk versus benefits in detail including risk of thrombosis, infection, bleeding.  Return to clinic in 2 weeks to discuss results and further management

## 2023-08-23 NOTE — Progress Notes (Signed)
 Patient Care Team: [name], DO as PCP - General (Family Medicine) Lonni Slain, MD as PCP - Cardiology (Cardiology) [facility], [facility] (Ophthalmology) Ladora, Ross Lacy Cohn, MD as Referring Physician (Optometry) Armbruster, Elspeth SQUIBB, MD as Consulting Physician (Gastroenterology)  Clinic Day:  08/23/2023  Referring physician: [name], DO   CHIEF COMPLAINT:  CC: Right lung mass    ASSESSMENT & PLAN:   Assessment & Plan: Heather [name]  is a 68 y.o. 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  Considering the extent of the disease and bilateral nature of, chemoRT is not an option for the patient.  Recommended systemic chemoimmunotherapy - Will send Guardant360 today and PD-L1 testing on the tissue.  If there is more tissue, will consider Caris testing - Discussed that patient would require chemoimmunotherapy based on the results and the treatment would be with a palliative intent. - Will refer for port placement today.  Discussed risk versus benefits in detail including risk of thrombosis, infection, bleeding.  Return to clinic in 2 weeks to discuss results and further management Tobacco use 40-year smoking history with recent cessation attempt.  Patient quit smoking on 08/08/2023  - Encouraged attempts to abstain from smoking Anxiety Significant anxiety exacerbated by cancer diagnosis and personal losses. Current medication, Buspar , is inadequate.  - Prescribed Ambien  for sleep disturbances related to anxiety.    The patient understands the plans discussed today and is in agreement with them.  She knows to contact our  office if she develops concerns prior to her next appointment.  I provided 30 minutes of face-to-face time during this encounter and > 50% was spent counseling as documented under my assessment and plan.    Mickiel Dry, MD  [facility] [place] - A DEPT OF [facility] [address]  [place] [address] Dept: [phone] Dept Fax: [phone]   No orders of the defined types were placed in this encounter.    ONCOLOGY HISTORY:   Oncology History  Non-small cell lung cancer, right (HCC)  06/26/2023 Imaging   CT chest low dose without contrast:  IMPRESSION: 1. Lung-RADS 4B, suspicious. Additional imaging evaluation or consultation with Pulmonology or Thoracic Surgery recommended. Irregular spiculated partially cavitary solid 3.6 cm peripheral apical right upper lobe lung mass, highly suspicious for primary bronchogenic carcinoma. At least 7 additional solid pulmonary nodules scattered in the right greater than left lungs, largest 8.6 mm in the posterior right lower lobe, and including a cavitary 8 mm left lower lobe nodule, suspicious for bilateral pulmonary metastases. Mildly enlarged right paratracheal lymph node, suspicious for nodal metastasis.    07/11/2023 Initial Diagnosis   Lung mass   07/19/2023 PET scan   IMPRESSION: Dominant right apical cavitary lesion is diffusely hypermetabolic consistent with area of malignancy. There are several metastatic nodes identified involving the right hilum, thoracic inlet, supraclavicular region as well as along the mediastinum, right greater than left. There are a few other small lung nodules bilaterally which have low-level uptake. These have a differential and could represent other areas of neoplastic disease versus infectious/inflammatory.  No specific abnormal uptake below the diaphragm    08/07/2023 Imaging  MRI brain W WO contrast:   IMPRESSION: There are several small T2  hyperintensities within the cerebral white matter. These abnormalities are nonspecific and of uncertain etiology or clinical significance. They are more common in older patients and patients with risk factors for vascular disease.   No evidence of metastatic disease.   08/13/2023 Pathology Results   FINAL MICROSCOPIC DIAGNOSIS:  A. LUNG, RUL, NEEDLE ASPIRATION:  - Non-small cell carcinoma, favor squamous cell carcinoma.   B. LYMPH NODE, 4R, FINE NEEDLE ASPIRATION:  - Positive for malignancy.  - Metastatic non-small cell carcinoma is present.   C. LYMPH NODE, 7, FINE NEEDLE ASPIRATION:  - Positive for malignancy.  - Metastatic non-small cell carcinoma is present.         Current Treatment:  TBD  INTERVAL HISTORY:  Heather [name] is here today for follow up. Patient is accompanied by her husband. She denies fevers or chills. She denies pain. Her appetite is good.  She reports feeling well.  We discussed the PET scan and MRI results in detail.  We also discussed the tumor board discussion and recommendations from there.  Patient is not a candidate for chemo RT and would need palliative chemoimmunotherapy.  Patient is in agreement with the plan.  I have reviewed the past medical history, past surgical history, social history and family history with the patient and they are unchanged from previous note.  ALLERGIES:  has no known allergies.  MEDICATIONS:  Current Outpatient Medications  Medication Sig Dispense Refill   Ascorbic Acid (VITAMIN C) 100 MG tablet Take 100 mg by mouth daily.     aspirin  EC 81 MG tablet Take 1 tablet (81 mg total) by mouth daily. Swallow whole.     atorvastatin  (LIPITOR) 40 MG tablet Take 1 tablet (40 mg total) by mouth daily. 100 tablet 3   busPIRone  (BUSPAR ) 30 MG tablet Take 1/2 (one-half) tablet by mouth twice daily 100 tablet 3   Cholecalciferol (VITAMIN D -3) 25 MCG (1000 UT) CAPS Take by mouth.     diclofenac  Sodium (VOLTAREN ) 1 % GEL Apply 1  Application topically as needed (pain).     FLUoxetine  (PROZAC ) 40 MG capsule Take 1 capsule (40 mg total) by mouth daily. 100 capsule 3   levothyroxine  (SYNTHROID ) 112 MCG tablet Take 1 tablet (112 mcg total) by mouth daily before breakfast. 100 tablet 3   nicotine  (NICODERM CQ  - DOSED IN MG/24 HOURS) 14 mg/24hr patch Place 1 patch (14 mg total) onto the skin daily. 28 patch 0   omeprazole  (PRILOSEC) 40 MG capsule Take 1 capsule (40 mg total) by mouth daily. 100 capsule 3   oxybutynin  (DITROPAN  XL) 5 MG 24 hr tablet Take 1 tablet (5 mg total) by mouth at bedtime. For bladder 100 tablet 0   triamcinolone  cream (KENALOG ) 0.1 % Apply 1 Application topically 2 (two) times daily. X7-10 days to patch on left side of face. 30 g 0   valACYclovir  (VALTREX ) 1000 MG tablet TAKE 2 TABLETS BY MOUTH TWICE DAILY FOR 1 DAY AT FEVER BLISTER ONSET 20 tablet 2   zolpidem  (AMBIEN ) 5 MG tablet Take 1 tablet (5 mg total) by mouth at bedtime as needed for sleep. 30 tablet 0   No current facility-administered medications for this visit.    REVIEW OF SYSTEMS:   Constitutional: Denies fevers, chills or abnormal weight loss Eyes: Denies blurriness of vision Ears, nose, mouth, throat, and face: Denies mucositis or sore throat Respiratory: Denies cough, dyspnea or wheezes Cardiovascular: Denies  palpitation, chest discomfort or lower extremity swelling Gastrointestinal:  Denies nausea, heartburn or change in bowel habits Skin: Denies abnormal skin rashes Lymphatics: Denies new lymphadenopathy or easy bruising Neurological:Denies numbness, tingling or new weaknesses Behavioral/Psych: Mood is stable, no new changes  All other systems were reviewed with the patient and are negative.   VITALS:  There were no vitals taken for this visit.  Wt Readings from Last 3 Encounters:  08/20/23 172 lb (78 kg)  08/13/23 170 lb (77.1 kg)  08/06/23 172 lb 3.2 oz (78.1 kg)    There is no height or weight on file to calculate  BMI.  Performance status (ECOG): 1 - Symptomatic but completely ambulatory  PHYSICAL EXAM:   GENERAL:alert, no distress and comfortable SKIN: skin color, texture, turgor are normal, no rashes or significant lesions EYES: normal, Conjunctiva are pink and non-injected, sclera clear LUNGS: clear to auscultation and percussion with normal breathing effort HEART: regular rate & rhythm and no murmurs and no lower extremity edema ABDOMEN:abdomen soft, non-tender and normal bowel sounds Musculoskeletal:no cyanosis of digits and no clubbing  NEURO: alert & oriented x 3 with fluent speech  LABORATORY DATA:  I have reviewed the data as listed    Component Value Date/Time   NA 138 06/15/2023 0934   K 5.4 (H) 06/15/2023 0934   CL 102 06/15/2023 0934   CO2 23 06/15/2023 0934   GLUCOSE 84 06/15/2023 0934   GLUCOSE 92 12/24/2013 1301   BUN 11 06/15/2023 0934   CREATININE 0.62 06/15/2023 0934   CALCIUM  10.2 06/15/2023 0934   PROT 6.9 06/15/2023 0934   ALBUMIN 4.4 06/15/2023 0934   AST 19 06/15/2023 0934   ALT 13 06/15/2023 0934   ALKPHOS 94 06/15/2023 0934   BILITOT 0.4 06/15/2023 0934   GFRNONAA 91 01/21/2020 1615   GFRAA 105 01/21/2020 1615    Lab Results  Component Value Date   WBC 7.6 08/13/2023   NEUTROABS 3.3 04/10/2017   HGB 13.0 08/13/2023   HCT 38.8 08/13/2023   MCV 90.2 08/13/2023   PLT 291 08/13/2023      Chemistry      Component Value Date/Time   NA 138 06/15/2023 0934   K 5.4 (H) 06/15/2023 0934   CL 102 06/15/2023 0934   CO2 23 06/15/2023 0934   BUN 11 06/15/2023 0934   CREATININE 0.62 06/15/2023 0934      Component Value Date/Time   CALCIUM  10.2 06/15/2023 0934   ALKPHOS 94 06/15/2023 0934   AST 19 06/15/2023 0934   ALT 13 06/15/2023 0934   BILITOT 0.4 06/15/2023 0934       RADIOGRAPHIC STUDIES: I have personally reviewed the radiological images as listed and agreed with the findings in the report.  MR Brain W Wo Contrast Result Date:  08/13/2023 CLINICAL DATA:  Lung cancer EXAM: MRI HEAD WITHOUT AND WITH CONTRAST TECHNIQUE: Multiplanar, multiecho pulse sequences of the brain and surrounding structures were obtained without and with intravenous contrast. CONTRAST:  7.5mL GADAVIST  GADOBUTROL  1 MMOL/ML IV SOLN COMPARISON:  None Available. FINDINGS: MRI brain: There are several foci of T2 hyperintensity in the cerebral white matter. These do not have restricted diffusion. There is no abnormal enhancement. There is no acute or chronic infarct. The ventricles are normal. No mass lesion. There are normal flow signals in the carotid arteries and basilar artery. No significant bone marrow signal abnormality. No significant abnormality in the paranasal sinuses or soft tissues. IMPRESSION: There are several small T2 hyperintensities within the cerebral  white matter. These abnormalities are nonspecific and of uncertain etiology or clinical significance. They are more common in older patients and patients with risk factors for vascular disease. No evidence of metastatic disease. Electronically Signed   By: Nancyann Burns M.D.   On: 08/13/2023 11:21   CT SUPER D CHEST WO CONTRAST Result Date: 08/13/2023 CLINICAL DATA:  Cavitary lung mass * Tracking Code: BO * EXAM: CT CHEST WITHOUT CONTRAST TECHNIQUE: Multidetector CT imaging of the chest was performed using thin slice collimation for electromagnetic bronchoscopy planning purposes, without intravenous contrast. RADIATION DOSE REDUCTION: This exam was performed according to the departmental dose-optimization program which includes automated exposure control, adjustment of the mA and/or kV according to patient size and/or use of iterative reconstruction technique. COMPARISON:  PET-CT, 07/19/2023 FINDINGS: Cardiovascular: Aortic atherosclerosis. Normal heart size. Three-vessel coronary artery calcifications. No pericardial effusion. Mediastinum/Nodes: Redemonstrated enlarged mediastinal and right hilar lymph  nodes, previously FDG avid, index pretracheal node measuring up to 1.9 x 1.0 cm (series 2, image 48). Thyroid  gland, trachea, and esophagus demonstrate no significant findings. Lungs/Pleura: Spiculated, partially cavitary mass of the peripheral right apex measures 3.8 x 2.4 cm (series 4, image 41). Multiple bilateral pulmonary nodules are unchanged, for example 0.9 x 0.7 cm nodule in the right middle lobe (series 4, image 70) and a partially cavitary subpleural nodule of the dependent superior segment left lower lobe measuring 0.8 x 0.8 cm (series 4, image 71). Mild predominantly centrilobular emphysema and underlying pulmonary fibrosis in a pattern with apical to basal gradient, featuring irregular peripheral interstitial opacity and ground-glass. No pleural effusion or pneumothorax. Upper Abdomen: No acute abnormality.  Status post cholecystectomy Musculoskeletal: No chest wall abnormality. No acute osseous findings. IMPRESSION: 1. Spiculated, partially cavitary and FDG avid mass of the peripheral right apex measures 3.8 x 2.4 cm, consistent with primary bronchogenic malignancy. 2. Multiple bilateral pulmonary nodules are unchanged, including a partially cavitary subpleural nodule of the dependent superior segment left lower lobe. 3. Redemonstrated enlarged mediastinal and right hilar lymph nodes, previously FDG avid, consistent with nodal metastatic disease. 4. 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 5. Coronary artery disease. Aortic Atherosclerosis (ICD10-I70.0) and Emphysema (ICD10-J43.9). Electronically Signed   By: Marolyn JONETTA Jaksch M.D.   On: 08/13/2023 07:33

## 2023-08-24 ENCOUNTER — Encounter: Payer: Self-pay | Admitting: Family Medicine

## 2023-08-24 ENCOUNTER — Encounter (HOSPITAL_COMMUNITY): Payer: Self-pay

## 2023-08-25 NOTE — Progress Notes (Signed)
 The proposed treatment discussed in conference is for discussion purpose only and is not a binding recommendation.  The patients have not been physically examined, or presented with their treatment options.  Therefore, final treatment plans cannot be decided.

## 2023-08-29 ENCOUNTER — Ambulatory Visit

## 2023-08-29 ENCOUNTER — Inpatient Hospital Stay: Admitting: Licensed Clinical Social Worker

## 2023-08-29 DIAGNOSIS — C3491 Malignant neoplasm of unspecified part of right bronchus or lung: Secondary | ICD-10-CM

## 2023-08-29 NOTE — Progress Notes (Signed)
 [facility] Clinical Social Work  Initial Assessment   Heather [name] is a 68 y.o. year old female contacted by phone. Clinical Social Work was referred by medical provider for assessment of psychosocial needs.   SDOH (Social Determinants of Health) assessments performed: Yes SDOH Interventions    Flowsheet Row Clinical Support from 03/13/2022 in [facility] Clinical Support from 03/11/2021 in [facility] Office Visit from 03/10/2020 in [facility]  SDOH Interventions     Food Insecurity Interventions Intervention Not Indicated Intervention Not Indicated --  Housing Interventions Intervention Not Indicated Intervention Not Indicated --  Transportation Interventions Intervention Not Indicated Intervention Not Indicated --  Utilities Interventions Intervention Not Indicated -- --  Alcohol Usage Interventions Intervention Not Indicated (Score <7) -- --  Depression Interventions/Treatment  -- -- EYV7-0 Score <4 Follow-up Not Indicated  Financial Strain Interventions Intervention Not Indicated Intervention Not Indicated --  Physical Activity Interventions Intervention Not Indicated Intervention Not Indicated --  Stress Interventions Intervention Not Indicated Intervention Not Indicated --  Social Connections Interventions Intervention Not Indicated Intervention Not Indicated --    SDOH Screenings   Food Insecurity: No Food Insecurity (07/11/2023)  Housing: Low Risk  (07/11/2023)  Transportation Needs: No Transportation Needs (07/11/2023)  Utilities: Not At Risk (07/11/2023)  Alcohol Screen: Low Risk  (03/13/2022)  Depression (PHQ2-9): Low Risk  (08/23/2023)  Financial Resource Strain: Low Risk  (06/14/2023)  Physical Activity: Insufficiently Active (06/14/2023)  Social Connections: Moderately Integrated (06/14/2023)  Stress: No Stress Concern Present (06/14/2023)  Tobacco Use: Medium Risk (08/20/2023)      Distress Screen completed: No     No data to display            Family/Social Information:  Housing Arrangement: patient lives with her husband, 98 y/o son and 27 y/o grandson.  Family members/support persons in your life? Pt reports her family will be available to support her as needed. Transportation concerns: no  Employment: Retired .  Income source: Actor concerns: Yes Type of concern: Medical bills Food access concerns: no Religious or spiritual practice: Yes-[employer] Advanced directives: Not known Services Currently in place:  none  Coping/ Adjustment to diagnosis: Patient understands treatment plan and what happens next? yes Concerns about diagnosis and/or treatment: Overwhelmed by information, Afraid of cancer, How I will pay for the services I need, and Quality of life Patient reported stressors: Finances, Anxiety/ nervousness, and Adjusting to my illness Hopes and/or priorities: pt's priority is to start treatment w/ the hope of positive results Patient enjoys time with family/ friends Current coping skills/ strengths: Capable of independent living , Motivation for treatment/growth , Physical Health , and Supportive family/friends     SUMMARY: Current SDOH Barriers:  Financial constraints related to fixed income  Clinical Social Work Clinical Goal(s):  Explore community resource options for unmet needs related to:  Financial Strain   Interventions: Discussed common feeling and emotions when being diagnosed with cancer, and the importance of support during treatment Informed patient of the support team roles and support services at [facility] Provided CSW contact information and encouraged patient to call with any questions or concerns Referred patient to community resources: Wadie Rung.  CSW to email pt the link to [employer] for grant assistance w/ medical bills.  Explained the [employer] and qualifications for approval.  Pt to  apply for food stamps.  Discussed anxiety related to diagnosis.  Pt informed of supportive counseling available.  Follow Up Plan: Patient will contact CSW with any support or resource needs Patient verbalizes understanding of plan: Yes    Heather JONELLE Manna, LCSW Clinical Social Worker [facility]

## 2023-09-04 ENCOUNTER — Ambulatory Visit: Admitting: Internal Medicine

## 2023-09-04 ENCOUNTER — Other Ambulatory Visit: Payer: Self-pay | Admitting: Radiology

## 2023-09-04 ENCOUNTER — Encounter (HOSPITAL_COMMUNITY): Payer: Self-pay | Admitting: Radiology

## 2023-09-04 NOTE — H&P (Signed)
 Chief Complaint: Squamous cell carcinoma. Request is portacath for chemotherapy access  Referring Physician(s): Davonna Siad  Supervising Physician: Vanice Revel  Patient Status: [facility] - Out-pt  History of Present Illness: Heather [name] is a 68 y.o. female outpatient. Former smoker. History of HLD, hypothyroidism. 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  Team is requesting a portacath for chemoimmunotherapy.   Currently without any significant complaints. Patient alert and laying in bed,calm. Denies any fevers, headache, chest pain, SOB, cough, abdominal pain, nausea, vomiting or bleeding.   CT Chest from 7.23.25. No line history per EPIC. Labs from 7.28.25 unremarkable. Patient is on ASA 81 mg. NKDA. Patient has been NPO since midnight   Return precautions and treatment recommendations and follow-up discussed with the patient *** who is agreeable with the plan.    Past Medical History:  Diagnosis Date   Anxiety    Arthritis    Cataract    Depression    Fibromyalgia    GERD (gastroesophageal reflux disease)    History of hiatal hernia    Hyperlipidemia    no meds currently   Hypothyroidism    PONV (postoperative nausea and vomiting)    Thyroid  disease    hypo    Past Surgical History:  Procedure Laterality Date   BRONCHIAL BIOPSY  08/13/2023   Procedure: BRONCHOSCOPY, WITH BIOPSY;  Surgeon: Shelah Lamar RAMAN, MD;  Location: [facility] ENDOSCOPY;  Service: Pulmonary;;   BRONCHIAL NEEDLE ASPIRATION BIOPSY  08/13/2023   Procedure: BRONCHOSCOPY, WITH NEEDLE ASPIRATION BIOPSY;  Surgeon: Shelah Lamar RAMAN, MD;  Location: [facility] ENDOSCOPY;  Service: Pulmonary;;   CATARACT EXTRACTION Left    CATARACT EXTRACTION W/PHACO Right 12/29/2013   Procedure: CATARACT EXTRACTION PHACO AND INTRAOCULAR LENS PLACEMENT (IOC);  Surgeon: Cherene Mania, MD;  Location: [facility] ORS;  Service: Ophthalmology;  Laterality: Right;  CDE:4.84   CHOLECYSTECTOMY  N/A 02/26/2023   Procedure: LAPAROSCOPIC CHOLECYSTECTOMY;  Surgeon: Stevie Herlene Righter, MD;  Location: [facility] ORS;  Service: General;  Laterality: N/A;   COLONOSCOPY     EYE SURGERY     cleaned debris from behind eye.   FOOT FRACTURE SURGERY Left 2017   heel   KNEE SURGERY Right 2018   torn meniscus   TUBAL LIGATION     tubal ligation reversal     VAGINAL HYSTERECTOMY     VIDEO BRONCHOSCOPY WITH ENDOBRONCHIAL NAVIGATION Bilateral 08/13/2023   Procedure: VIDEO BRONCHOSCOPY WITH ENDOBRONCHIAL NAVIGATION;  Surgeon: Shelah Lamar RAMAN, MD;  Location: [facility] ENDOSCOPY;  Service: Pulmonary;  Laterality: Bilateral;   VIDEO BRONCHOSCOPY WITH ENDOBRONCHIAL ULTRASOUND  08/13/2023   Procedure: BRONCHOSCOPY, WITH EBUS;  Surgeon: Shelah Lamar RAMAN, MD;  Location: [facility] ENDOSCOPY;  Service: Pulmonary;;    Allergies: Patient has no known allergies.  Medications: Prior to Admission medications   Medication Sig Start Date End Date Taking? Authorizing Provider  Ascorbic Acid (VITAMIN C) 100 MG tablet Take 100 mg by mouth daily.    [provider]  aspirin  EC 81 MG tablet Take 1 tablet (81 mg total) by mouth daily. Swallow whole. 08/18/22   Lonni Slain, MD  atorvastatin  (LIPITOR) 40 MG tablet Take 1 tablet (40 mg total) by mouth daily. 06/15/23   [name], DO  busPIRone  (BUSPAR ) 30 MG tablet Take 1/2 (one-half) tablet by mouth twice daily 06/15/23   Jolinda Norene M, DO  Cholecalciferol (VITAMIN D -3) 25 MCG (1000 UT) CAPS Take by mouth.    [provider]  diclofenac  Sodium (VOLTAREN ) 1 %  GEL Apply 1 Application topically as needed (pain). 08/13/23   Shelah Lamar RAMAN, MD  FLUoxetine  (PROZAC ) 40 MG capsule Take 1 capsule (40 mg total) by mouth daily. 06/15/23   [name], DO  levothyroxine  (SYNTHROID ) 112 MCG tablet Take 1 tablet (112 mcg total) by mouth daily before breakfast. 06/15/23   Jolinda Norene M, DO  nicotine  (NICODERM CQ  - DOSED IN MG/24 HOURS) 14 mg/24hr patch  Place 1 patch (14 mg total) onto the skin daily. 07/26/23   Davonna Siad, MD  omeprazole  (PRILOSEC) 40 MG capsule Take 1 capsule (40 mg total) by mouth daily. 06/15/23   [name], DO  oxybutynin  (DITROPAN  XL) 5 MG 24 hr tablet Take 1 tablet (5 mg total) by mouth at bedtime. For bladder 06/15/23   Jolinda Norene M, DO  triamcinolone  cream (KENALOG ) 0.1 % Apply 1 Application topically 2 (two) times daily. X7-10 days to patch on left side of face. 06/15/23   [name], DO  valACYclovir  (VALTREX ) 1000 MG tablet TAKE 2 TABLETS BY MOUTH TWICE DAILY FOR 1 DAY AT FEVER BLISTER ONSET 06/15/23   Jolinda Norene M, DO  zolpidem  (AMBIEN ) 5 MG tablet Take 1 tablet (5 mg total) by mouth at bedtime as needed for sleep. 07/26/23   Davonna Siad, MD     Family History  Problem Relation Age of Onset   Colon polyps Mother    Colon cancer Mother 24   Cancer Mother    Cancer - Colon Mother    Diabetes Father    Heart attack Father 47   Heart disease Father    Diabetes Sister    Leukemia Sister    Heart disease Sister    Acute lymphoblastic leukemia Sister    Diabetes Sister    Heart disease Sister    Thyroid  disease Sister    Lung cancer Sister    Cancer - Lung Sister    Diabetes Brother    Heart attack Brother 15   Hyperlipidemia Brother    Diabetes Brother    Mental illness Brother    Graves' disease Brother    Heart disease Brother    CAD Brother    Diabetes Brother    CAD Brother    Heart disease Brother    Healthy Son    Rectal cancer Other    Colon cancer Other 48   Esophageal cancer Neg Hx    Stomach cancer Neg Hx    Breast cancer Neg Hx     Social History   Socioeconomic History   Marital status: Married    Spouse name: Quinton   Number of children: 1   Years of education: Not on file   Highest education level: Never attended school  Occupational History   Occupation: retired    Comment: Scientist, water quality and [employer]  Tobacco Use   Smoking status:  Former    Current packs/day: 0.00    Average packs/day: 1 pack/day for 39.0 years (39.0 ttl pk-yrs)    Types: Cigarettes    Quit date: 08/08/2023    Years since quitting: 0.0    Passive exposure: Past   Smokeless tobacco: Never  Vaping Use   Vaping status: Never Used  Substance and Sexual Activity   Alcohol use: Not Currently    Comment: occasionally   Drug use: Never   Sexual activity: Not Currently    Birth control/protection: Surgical  Other Topics Concern   Not on file  Social History Narrative   Lives home  with husband one level home   One son by first husband who lives in [place]   Social Drivers of Health   Financial Resource Strain: Low Risk  (06/14/2023)   Overall Financial Resource Strain (CARDIA)    Difficulty of Paying Living Expenses: Not hard at all  Food Insecurity: No Food Insecurity (07/11/2023)   Hunger Vital Sign    Worried About Running Out of Food in the Last Year: Never true    Ran Out of Food in the Last Year: Never true  Transportation Needs: No Transportation Needs (07/11/2023)   PRAPARE - Administrator, Civil Service (Medical): No    Lack of Transportation (Non-Medical): No  Physical Activity: Insufficiently Active (06/14/2023)   Exercise Vital Sign    Days of Exercise per Week: 1 day    Minutes of Exercise per Session: 30 min  Stress: No Stress Concern Present (06/14/2023)   [employer] of Occupational Health - Occupational Stress Questionnaire    Feeling of Stress : Not at all  Social Connections: Moderately Integrated (06/14/2023)   Social Connection and Isolation Panel    Frequency of Communication with Friends and Family: More than three times a week    Frequency of Social Gatherings with Friends and Family: Twice a week    Attends Religious Services: 1 to 4 times per year    Active Member of [employer] or Organizations: No    Attends Engineer, structural: Not on file    Marital Status: Married    ECOG Status: {CHL ONC  ECOG ED:8845999799}  Review of Systems: A 12 point ROS discussed and pertinent positives are indicated in the HPI above.  All other systems are negative.  Review of Systems  Vital Signs: There were no vitals taken for this visit.  Advance Care Plan: {Advance Care Eojw:73180}    Physical Exam  Imaging: MR Brain W Wo Contrast Result Date: 08/13/2023 CLINICAL DATA:  Lung cancer EXAM: MRI HEAD WITHOUT AND WITH CONTRAST TECHNIQUE: Multiplanar, multiecho pulse sequences of the brain and surrounding structures were obtained without and with intravenous contrast. CONTRAST:  7.5mL GADAVIST  GADOBUTROL  1 MMOL/ML IV SOLN COMPARISON:  None Available. FINDINGS: MRI brain: There are several foci of T2 hyperintensity in the cerebral white matter. These do not have restricted diffusion. There is no abnormal enhancement. There is no acute or chronic infarct. The ventricles are normal. No mass lesion. There are normal flow signals in the carotid arteries and basilar artery. No significant bone marrow signal abnormality. No significant abnormality in the paranasal sinuses or soft tissues. IMPRESSION: There are several small T2 hyperintensities within the cerebral white matter. These abnormalities are nonspecific and of uncertain etiology or clinical significance. They are more common in older patients and patients with risk factors for vascular disease. No evidence of metastatic disease. Electronically Signed   By: Nancyann Burns M.D.   On: 08/13/2023 11:21   DG Chest Port 1 View Result Date: 08/13/2023 CLINICAL DATA:  Status post bronchoscopy EXAM: PORTABLE CHEST 1 VIEW COMPARISON:  CT chest dated 08/08/2023, chest radiograph dated 01/13/2014 FINDINGS: Normal lung volumes. Known right upper lobe mass. No pleural effusion or pneumothorax. The heart size and mediastinal contours are within normal limits. No acute osseous abnormality. IMPRESSION: Known right upper lobe mass. No pneumothorax. Electronically Signed   By:  Limin  Xu M.D.   On: 08/13/2023 10:23   DG C-ARM BRONCHOSCOPY Result Date: 08/13/2023 C-ARM BRONCHOSCOPY: Fluoroscopy was utilized by the requesting physician.  No radiographic  interpretation.   CT SUPER D CHEST WO CONTRAST Result Date: 08/13/2023 CLINICAL DATA:  Cavitary lung mass * Tracking Code: BO * EXAM: CT CHEST WITHOUT CONTRAST TECHNIQUE: Multidetector CT imaging of the chest was performed using thin slice collimation for electromagnetic bronchoscopy planning purposes, without intravenous contrast. RADIATION DOSE REDUCTION: This exam was performed according to the departmental dose-optimization program which includes automated exposure control, adjustment of the mA and/or kV according to patient size and/or use of iterative reconstruction technique. COMPARISON:  PET-CT, 07/19/2023 FINDINGS: Cardiovascular: Aortic atherosclerosis. Normal heart size. Three-vessel coronary artery calcifications. No pericardial effusion. Mediastinum/Nodes: Redemonstrated enlarged mediastinal and right hilar lymph nodes, previously FDG avid, index pretracheal node measuring up to 1.9 x 1.0 cm (series 2, image 48). Thyroid  gland, trachea, and esophagus demonstrate no significant findings. Lungs/Pleura: Spiculated, partially cavitary mass of the peripheral right apex measures 3.8 x 2.4 cm (series 4, image 41). Multiple bilateral pulmonary nodules are unchanged, for example 0.9 x 0.7 cm nodule in the right middle lobe (series 4, image 70) and a partially cavitary subpleural nodule of the dependent superior segment left lower lobe measuring 0.8 x 0.8 cm (series 4, image 71). Mild predominantly centrilobular emphysema and underlying pulmonary fibrosis in a pattern with apical to basal gradient, featuring irregular peripheral interstitial opacity and ground-glass. No pleural effusion or pneumothorax. Upper Abdomen: No acute abnormality.  Status post cholecystectomy Musculoskeletal: No chest wall abnormality. No acute osseous  findings. IMPRESSION: 1. Spiculated, partially cavitary and FDG avid mass of the peripheral right apex measures 3.8 x 2.4 cm, consistent with primary bronchogenic malignancy. 2. Multiple bilateral pulmonary nodules are unchanged, including a partially cavitary subpleural nodule of the dependent superior segment left lower lobe. 3. Redemonstrated enlarged mediastinal and right hilar lymph nodes, previously FDG avid, consistent with nodal metastatic disease. 4. 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 5. Coronary artery disease. Aortic Atherosclerosis (ICD10-I70.0) and Emphysema (ICD10-J43.9). Electronically Signed   By: Marolyn JONETTA Jaksch M.D.   On: 08/13/2023 07:33    Labs:  CBC: Recent Labs    02/19/23 1405 06/15/23 0934 08/13/23 0625  WBC 8.8 11.6* 7.6  HGB 12.3 13.1 13.0  HCT 36.7 40.4 38.8  PLT 324 323 291    COAGS: No results for input(s): INR, APTT in the last 8760 hours.  BMP: Recent Labs    06/15/23 0934  NA 138  K 5.4*  CL 102  CO2 23  GLUCOSE 84  BUN 11  CALCIUM  10.2  CREATININE 0.62    LIVER FUNCTION TESTS: Recent Labs    06/15/23 0934  BILITOT 0.4  AST 19  ALT 13  ALKPHOS 94  PROT 6.9  ALBUMIN 4.4    TUMOR MARKERS: No results for input(s): AFPTM, CEA, CA199, CHROMGRNA in the last 8760 hours.  Assessment and Plan:  68 yo. Female outpatient. Former smoker. History of HLD, hypothyroidism. 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  Team is requesting a portacath for chemoimmunotherapy.   PLAN: IR Image Guided Portacath Placement.  Risks and benefits of image guided port-a-catheter placement was discussed with the patient including, but not limited to bleeding, infection, pneumothorax, or  fibrin sheath development and need for additional procedures.    Thank you for this interesting consult.  I greatly enjoyed meeting Heather [name] and look forward to participating in their care.  A copy  of this report was sent to the requesting provider on this date.  Electronically Signed: Delon JAYSON Beagle, NP 09/04/2023, 6:25 PM   I spent a total of {New PWEU:[id number]} {New Out-Pt:[id number]}  {Established Out-Pt:[id number]} in face to face in clinical consultation, greater than 50% of which was counseling/coordinating care for ***

## 2023-09-05 ENCOUNTER — Ambulatory Visit (HOSPITAL_COMMUNITY)
Admission: RE | Admit: 2023-09-05 | Discharge: 2023-09-05 | Disposition: A | Discharge status: Home / Self Care | Source: Ambulatory Visit | Attending: Oncology | Admitting: Oncology

## 2023-09-05 ENCOUNTER — Other Ambulatory Visit: Payer: Self-pay

## 2023-09-05 DIAGNOSIS — Z87891 Personal history of nicotine dependence: Secondary | ICD-10-CM | POA: Insufficient documentation

## 2023-09-05 DIAGNOSIS — C3491 Malignant neoplasm of unspecified part of right bronchus or lung: Secondary | ICD-10-CM | POA: Insufficient documentation

## 2023-09-05 DIAGNOSIS — Z7982 Long term (current) use of aspirin: Secondary | ICD-10-CM | POA: Diagnosis not present

## 2023-09-05 DIAGNOSIS — C7800 Secondary malignant neoplasm of unspecified lung: Secondary | ICD-10-CM | POA: Diagnosis not present

## 2023-09-05 DIAGNOSIS — C3490 Malignant neoplasm of unspecified part of unspecified bronchus or lung: Secondary | ICD-10-CM | POA: Diagnosis not present

## 2023-09-05 HISTORY — PX: IR IMAGING GUIDED PORT INSERTION: IMG5740

## 2023-09-05 MED ORDER — FENTANYL CITRATE (PF) 100 MCG/2ML IJ SOLN
INTRAMUSCULAR | Status: AC | PRN
  Administered 2023-09-05 (×2): 25 ug via INTRAVENOUS
  Administered 2023-09-05: 50 ug via INTRAVENOUS

## 2023-09-05 MED ORDER — HEPARIN SOD (PORK) LOCK FLUSH 100 UNIT/ML IV SOLN
500.0000 [IU] | Freq: Once | INTRAVENOUS | Status: AC
  Administered 2023-09-05: 500 [IU] via INTRAVENOUS

## 2023-09-05 MED ORDER — FENTANYL CITRATE (PF) 100 MCG/2ML IJ SOLN
INTRAMUSCULAR | Status: AC
  Filled 2023-09-05: qty 2

## 2023-09-05 MED ORDER — LIDOCAINE-EPINEPHRINE 1 %-1:100000 IJ SOLN
INTRAMUSCULAR | Status: AC
  Filled 2023-09-05: qty 1

## 2023-09-05 MED ORDER — SODIUM CHLORIDE 0.9 % IV SOLN: INTRAVENOUS | Status: DC

## 2023-09-05 MED ORDER — MIDAZOLAM HCL 2 MG/2ML IJ SOLN
INTRAMUSCULAR | Status: AC
  Filled 2023-09-05: qty 2

## 2023-09-05 MED ORDER — HEPARIN SOD (PORK) LOCK FLUSH 100 UNIT/ML IV SOLN
INTRAVENOUS | Status: AC
Start: 2023-09-05 — End: 2023-09-05
  Filled 2023-09-05: qty 5

## 2023-09-05 MED ORDER — MIDAZOLAM HCL 2 MG/2ML IJ SOLN
INTRAMUSCULAR | Status: AC | PRN
  Administered 2023-09-05: 1 mg via INTRAVENOUS
  Administered 2023-09-05: .5 mg via INTRAVENOUS

## 2023-09-05 MED ORDER — LIDOCAINE-EPINEPHRINE (PF) 1 %-1:200000 IJ SOLN
20.0000 mL | Freq: Once | INTRAMUSCULAR | Status: AC
  Administered 2023-09-05: 20 mL

## 2023-09-05 NOTE — Procedures (Signed)
Interventional Radiology Procedure Note  Procedure: RT internal jugular POWER PORT    Complications: None  Estimated Blood Loss:  MIN  Findings: TIP SVCRA    M. TREVOR Levaeh Vice, MD    

## 2023-09-09 NOTE — Progress Notes (Signed)
 Patient Care Team: [name], DO as PCP - General (Family Medicine) Lonni Slain, MD as PCP - Cardiology (Cardiology) [facility], [facility] (Ophthalmology) Ladora, Ross Lacy Cohn, MD as Referring Physician (Optometry) Armbruster, Elspeth SQUIBB, MD as Consulting Physician (Gastroenterology)  Clinic Day:  09/11/2023  Referring physician: [name], DO   CHIEF COMPLAINT:  CC: Right lung mass    ASSESSMENT & PLAN:   Assessment & Plan: Heather [name]  is a 68 y.o. 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  Considering the extent of the disease and bilateral nature of, chemoRT is not an option for the patient.  Recommended systemic chemoimmunotherapy Patient had port placed Caris: No targetable mutations, PD-L1: 0  -Guardant360: Pending -We discussed that considering her Caris results, PD-L1 0, will consider chemoimmunotherapy for 2 cycles followed by immunotherapy alone.  Considering the regimen carboplatin, paclitaxel, ipilimumab and nivolumab.  Also discussed the option of doing chemotherapy with single agent pembrolizumab considering probably less or immunotherapy related side effects with pembrolizumab.  She wishes to try dual immunotherapy first and if considered safe doing single agent immunotherapy. - Discussed risk versus benefits in detail.  Also discussed side effects in detail.  Immunotherapy side effects would include nausea, vomiting, numbness and tingling in hands and feet loss.  Immunotherapy side effects can include rash, thyroid  issues, pneumonitis, diarrhea secondary to colitis or hepatitis.  Patient understands the risks and  benefits in detail and wants to proceed with the treatment - Will repeat PET scan after completion of 3 cycles of treatment - Plan to start chemoimmunotherapy next week  Return to clinic prior to second cycle of chemoimmunotherapy to assess for tolerability. Anxiety Significant anxiety exacerbated by cancer diagnosis and personal losses. Current medication, Buspar , is inadequate.  - Prescribed Ambien  for sleep disturbances related to anxiety.  Tobacco use 40-year smoking history with recent cessation attempt.  Patient quit smoking on 08/08/2023   - Encouraged attempts to abstain from smoking  The patient understands the plans discussed today and is in agreement with them.  She knows to contact our office if she develops concerns prior to her next appointment.  I provided 40 minutes of face-to-face time during this encounter and > 50% was spent counseling as documented under my assessment and plan.    LILLETTE Verneta SAUNDERS Teague,acting as a Neurosurgeon for Mickiel Dry, MD.,have documented all relevant documentation on the behalf of Mickiel Dry, MD,as directed by  Mickiel Dry, MD while in the presence of Mickiel Dry, MD.  I, Mickiel Dry MD, have reviewed the above documentation for accuracy and completeness, and I agree with the above.     Mickiel Dry, MD  [facility] [place] - A DEPT OF [facility] [address] [place] [address] Dept: [phone] Dept Fax: [phone]   Orders Placed This Encounter  Procedures   CBC with Differential    Standing Status:   Future    Expected Date:   09/12/2023    Expiration Date:   09/11/2024   Comprehensive metabolic panel    Standing Status:   Future    Expected Date:   09/12/2023    Expiration Date:  09/11/2024   T4    Standing Status:   Future    Expected Date:   09/12/2023    Expiration Date:   09/11/2024   TSH    Standing Status:   Future    Expected Date:    09/12/2023    Expiration Date:   09/11/2024   CBC with Differential    Standing Status:   Future    Expected Date:   10/03/2023    Expiration Date:   10/02/2024   Comprehensive metabolic panel    Standing Status:   Future    Expected Date:   10/03/2023    Expiration Date:   10/02/2024   CBC with Differential    Standing Status:   Future    Expected Date:   10/24/2023    Expiration Date:   10/23/2024   Comprehensive metabolic panel    Standing Status:   Future    Expected Date:   10/24/2023    Expiration Date:   10/23/2024   T4    Standing Status:   Future    Expected Date:   10/24/2023    Expiration Date:   10/23/2024   TSH    Standing Status:   Future    Expected Date:   10/24/2023    Expiration Date:   10/23/2024   CBC with Differential    Standing Status:   Future    Expected Date:   11/14/2023    Expiration Date:   11/13/2024   Comprehensive metabolic panel    Standing Status:   Future    Expected Date:   11/14/2023    Expiration Date:   11/13/2024     ONCOLOGY HISTORY:   Oncology History  Non-small cell lung cancer, right (HCC)  06/26/2023 Imaging   CT chest low dose without contrast:  IMPRESSION: 1. Lung-RADS 4B, suspicious. Additional imaging evaluation or consultation with Pulmonology or Thoracic Surgery recommended. Irregular spiculated partially cavitary solid 3.6 cm peripheral apical right upper lobe lung mass, highly suspicious for primary bronchogenic carcinoma. At least 7 additional solid pulmonary nodules scattered in the right greater than left lungs, largest 8.6 mm in the posterior right lower lobe, and including a cavitary 8 mm left lower lobe nodule, suspicious for bilateral pulmonary metastases. Mildly enlarged right paratracheal lymph node, suspicious for nodal metastasis.    07/19/2023 PET scan   IMPRESSION: Dominant right apical cavitary lesion is diffusely hypermetabolic consistent with area of malignancy. There are several metastatic nodes identified involving  the right hilum, thoracic inlet, supraclavicular region as well as along the mediastinum, right greater than left. There are a few other small lung nodules bilaterally which have low-level uptake. These have a differential and could represent other areas of neoplastic disease versus infectious/inflammatory.  No specific abnormal uptake below the diaphragm    08/07/2023 Imaging   MRI brain W WO contrast:   IMPRESSION: There are several small T2 hyperintensities within the cerebral white matter. These abnormalities are nonspecific and of uncertain etiology or clinical significance. They are more common in older patients and patients with risk factors for vascular disease.   No evidence of metastatic disease.   08/08/2023 Imaging   CT chest super D without contrast:  1. Spiculated, partially cavitary and FDG avid mass of the peripheral right apex measures 3.8 x 2.4 cm, consistent with primary bronchogenic malignancy. 2. Multiple bilateral pulmonary nodules are unchanged, including a partially cavitary subpleural nodule of the dependent superior segment left lower lobe. 3. Redemonstrated enlarged mediastinal  and right hilar lymph nodes, previously FDG avid, consistent with nodal metastatic disease.   08/13/2023 Pathology Results   FINAL MICROSCOPIC DIAGNOSIS:  A. LUNG, RUL, NEEDLE ASPIRATION:  - Non-small cell carcinoma, favor squamous cell carcinoma.   B. LYMPH NODE, 4R, FINE NEEDLE ASPIRATION:  - Positive for malignancy.  - Metastatic non-small cell carcinoma is present.   C. LYMPH NODE, 7, FINE NEEDLE ASPIRATION:  - Positive for malignancy.  - Metastatic non-small cell carcinoma is present.     08/23/2023 Cancer Staging   Staging form: Lung, AJCC V9 - Pathologic stage from 08/23/2023: Stage IVA (pT2, pN3, cM1a) - Signed by Davonna Siad, MD on 08/23/2023 Stage prefix: Initial diagnosis   09/05/2023 Procedure   IR guided port placement   09/07/2023 Pathology Results   Caris:    ALK, BRAF, EGFR, KRAS, MET, RET, ROS1: Negative  RB1, TP53: Positive, MSI-Stable, PDL1: Negative, 0%, TMB: low       Current Treatment:  TBD  INTERVAL HISTORY:  EMELEE RODOCKER is here today for follow up. Patient is accompanied by her husband.  Patient had port placement done since the last visit.  She reports occasional pain from the site but no other complaints.  No swelling or erythema present.  She has no other complaints today.  Denies cough, hemoptysis, weight loss, loss of appetite.  We discussed the Caris results in detail and that chemoimmunotherapy is the option.  We discussed risk versus benefits and side effects in detail.  I have reviewed the past medical history, past surgical history, social history and family history with the patient and they are unchanged from previous note.  ALLERGIES:  has no known allergies.  MEDICATIONS:  Current Outpatient Medications  Medication Sig Dispense Refill   Ascorbic Acid (VITAMIN C) 100 MG tablet Take 100 mg by mouth daily.     aspirin  EC 81 MG tablet Take 1 tablet (81 mg total) by mouth daily. Swallow whole.     atorvastatin  (LIPITOR) 40 MG tablet Take 1 tablet (40 mg total) by mouth daily. 100 tablet 3   busPIRone  (BUSPAR ) 30 MG tablet Take 1/2 (one-half) tablet by mouth twice daily 100 tablet 3   Cholecalciferol (VITAMIN D -3) 25 MCG (1000 UT) CAPS Take by mouth.     diclofenac  Sodium (VOLTAREN ) 1 % GEL Apply 1 Application topically as needed (pain).     FLUoxetine  (PROZAC ) 40 MG capsule Take 1 capsule (40 mg total) by mouth daily. 100 capsule 3   levothyroxine  (SYNTHROID ) 112 MCG tablet Take 1 tablet (112 mcg total) by mouth daily before breakfast. 100 tablet 3   omeprazole  (PRILOSEC) 40 MG capsule Take 1 capsule (40 mg total) by mouth daily. 100 capsule 3   triamcinolone  cream (KENALOG ) 0.1 % Apply 1 Application topically 2 (two) times daily. X7-10 days to patch on left side of face. 30 g 0   valACYclovir  (VALTREX ) 1000 MG tablet  TAKE 2 TABLETS BY MOUTH TWICE DAILY FOR 1 DAY AT FEVER BLISTER ONSET 20 tablet 2   zolpidem  (AMBIEN ) 5 MG tablet Take 1 tablet (5 mg total) by mouth at bedtime as needed for sleep. (Patient not taking: Reported on 09/11/2023) 30 tablet 0   No current facility-administered medications for this visit.    REVIEW OF SYSTEMS:   Constitutional: Denies fevers, chills or abnormal weight loss Eyes: Denies blurriness of vision Ears, nose, mouth, throat, and face: Denies mucositis or sore throat Respiratory: Denies cough, dyspnea or wheezes Cardiovascular: Denies palpitation, chest discomfort or lower  extremity swelling Gastrointestinal:  Denies nausea, heartburn or change in bowel habits Skin: Denies abnormal skin rashes Lymphatics: Denies new lymphadenopathy or easy bruising Neurological:Denies numbness, tingling or new weaknesses Behavioral/Psych: Mood is stable, no new changes  All other systems were reviewed with the patient and are negative.   VITALS:  Blood pressure 136/68, pulse 71, temperature 98.1 F (36.7 C), resp. rate 18, weight 174 lb 12.8 oz (79.3 kg), SpO2 97%.  Wt Readings from Last 3 Encounters:  09/11/23 174 lb 12.8 oz (79.3 kg)  09/05/23 173 lb (78.5 kg)  08/23/23 175 lb (79.4 kg)    Body mass index is 30 kg/m.  Performance status (ECOG): 1 - Symptomatic but completely ambulatory  PHYSICAL EXAM:   GENERAL:alert, no distress and comfortable SKIN: skin color, texture, turgor are normal, no rashes or significant lesions EYES: normal, Conjunctiva are pink and non-injected, sclera clear LUNGS: clear to auscultation and percussion with normal breathing effort HEART: regular rate & rhythm and no murmurs and no lower extremity edema ABDOMEN:abdomen soft, non-tender and normal bowel sounds Musculoskeletal:no cyanosis of digits and no clubbing  NEURO: alert & oriented x 3 with fluent speech  LABORATORY DATA:  I have reviewed the data as listed    Component Value  Date/Time   NA 138 06/15/2023 0934   K 5.4 (H) 06/15/2023 0934   CL 102 06/15/2023 0934   CO2 23 06/15/2023 0934   GLUCOSE 84 06/15/2023 0934   GLUCOSE 92 12/24/2013 1301   BUN 11 06/15/2023 0934   CREATININE 0.62 06/15/2023 0934   CALCIUM  10.2 06/15/2023 0934   PROT 6.9 06/15/2023 0934   ALBUMIN 4.4 06/15/2023 0934   AST 19 06/15/2023 0934   ALT 13 06/15/2023 0934   ALKPHOS 94 06/15/2023 0934   BILITOT 0.4 06/15/2023 0934   GFRNONAA 91 01/21/2020 1615   GFRAA 105 01/21/2020 1615    Lab Results  Component Value Date   WBC 7.6 08/13/2023   NEUTROABS 3.3 04/10/2017   HGB 13.0 08/13/2023   HCT 38.8 08/13/2023   MCV 90.2 08/13/2023   PLT 291 08/13/2023      Chemistry      Component Value Date/Time   NA 138 06/15/2023 0934   K 5.4 (H) 06/15/2023 0934   CL 102 06/15/2023 0934   CO2 23 06/15/2023 0934   BUN 11 06/15/2023 0934   CREATININE 0.62 06/15/2023 0934      Component Value Date/Time   CALCIUM  10.2 06/15/2023 0934   ALKPHOS 94 06/15/2023 0934   AST 19 06/15/2023 0934   ALT 13 06/15/2023 0934   BILITOT 0.4 06/15/2023 0934       RADIOGRAPHIC STUDIES: I have personally reviewed the radiological images as listed and agreed with the findings in the report.  None new to review

## 2023-09-10 NOTE — Assessment & Plan Note (Signed)
 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  Considering the extent of the disease and bilateral nature of, chemoRT is not an option for the patient.  Recommended systemic chemoimmunotherapy - Will send Guardant360 today and PD-L1 testing on the tissue.  If there is more tissue, will consider Caris testing - Discussed that patient would require chemoimmunotherapy based on the results and the treatment would be with a palliative intent. - Will refer for port placement today.  Discussed risk versus benefits in detail including risk of thrombosis, infection, bleeding.  Return to clinic in 2 weeks to discuss results and further management

## 2023-09-11 ENCOUNTER — Inpatient Hospital Stay: Admitting: Oncology

## 2023-09-11 ENCOUNTER — Encounter (HOSPITAL_COMMUNITY): Payer: Self-pay

## 2023-09-11 VITALS — BP 136/68 | HR 71 | Temp 98.1°F | Resp 18 | Wt 174.8 lb

## 2023-09-11 DIAGNOSIS — Z720 Tobacco use: Secondary | ICD-10-CM | POA: Diagnosis not present

## 2023-09-11 DIAGNOSIS — F419 Anxiety disorder, unspecified: Secondary | ICD-10-CM

## 2023-09-11 DIAGNOSIS — C3491 Malignant neoplasm of unspecified part of right bronchus or lung: Secondary | ICD-10-CM

## 2023-09-11 DIAGNOSIS — Z87891 Personal history of nicotine dependence: Secondary | ICD-10-CM | POA: Diagnosis not present

## 2023-09-11 DIAGNOSIS — Z79899 Other long term (current) drug therapy: Secondary | ICD-10-CM | POA: Diagnosis not present

## 2023-09-11 NOTE — Assessment & Plan Note (Signed)
 40-year smoking history with recent cessation attempt.  Patient quit smoking on 08/08/2023  - Encouraged attempts to abstain from smoking

## 2023-09-11 NOTE — Assessment & Plan Note (Addendum)
 Significant anxiety exacerbated by cancer diagnosis and personal losses. Current medication, Buspar , is inadequate.  - Prescribed Ambien  for sleep disturbances related to anxiety.

## 2023-09-11 NOTE — Patient Instructions (Signed)
 [facility] at [facility] Discharge Instructions   You were seen and examined today by Dr. Davonna.  She reviewed the results of your special molecular testing. It did not show any mutations in the cancer to target.   She discussed treatment options with you. Treatment will be a combination of drugs. The chemotherapy drugs are called Taxol and carboplatin. The immunotherapy drugs are called Opdivo and Yervoy. Treatment is given every 3 weeks. For two cycles you will receive chemotherapy and immunotherapy. After 2 cycles, the chemo drugs are dropped and the immunotherapy is given every 3 weeks.   We will arrange for you to have an education session with one of our nurses to review side effects in detail, how to manage those if you should experience them, and what a day in the clinic may look like.   Return as scheduled.    Thank you for choosing  Cancer Center at [facility] to provide your oncology and hematology care.  To afford each patient quality time with our provider, please arrive at least 15 minutes before your scheduled appointment time.   If you have a lab appointment with the Cancer Center please come in thru the Main Entrance and check in at the main information desk.  You need to re-schedule your appointment should you arrive 10 or more minutes late.  We strive to give you quality time with our providers, and arriving late affects you and other patients whose appointments are after yours.  Also, if you no show three or more times for appointments you may be dismissed from the clinic at the providers discretion.     Again, thank you for choosing [facility].  Our hope is that these requests will decrease the amount of time that you wait before being seen by our physicians.       _____________________________________________________________  Should you have questions after your visit to [facility], please contact  our office at [phone] and follow the prompts.  Our office hours are 8:00 a.m. and 4:30 p.m. Monday - Friday.  Please note that voicemails left after 4:00 p.m. may not be returned until the following business day.  We are closed weekends and major holidays.  You do have access to a nurse 24-7, just call the main number to the clinic [phone] and do not press any options, hold on the line and a nurse will answer the phone.    For prescription refill requests, have your pharmacy contact our office and allow 72 hours.    Due to Covid, you will need to wear a mask upon entering the hospital. If you do not have a mask, a mask will be given to you at the Main Entrance upon arrival. For doctor visits, patients may have 1 support person age 3 or older with them. For treatment visits, patients can not have anyone with them due to social distancing guidelines and our immunocompromised population.

## 2023-09-11 NOTE — Progress Notes (Signed)
 START OFF PATHWAY REGIMEN - Non-Small Cell Lung   [id number]:Carboplatin IV D1,22 + Paclitaxel IV D1,22 + Ipilimumab IV D1 + Nivolumab IV D1,22 q42 Days x 1 Cycle Followed by Ipilimumab IV D1 + Nivolumab IV D1,22 q42 Days:   Cycle 1: A cycle is 42 days:     Nivolumab      Ipilimumab      Carboplatin      Paclitaxel    Cycles 2 and beyond: A cycle is every 42 days:     Nivolumab      Ipilimumab   **Always confirm dose/schedule in your pharmacy ordering system**  Patient Characteristics: Stage IV Metastatic, Squamous, Molecular Analysis Completed, Alteration Present and Targeted Therapy Exhausted or EGFR Exon 20 Insertion or KRAS G12C or HER2 or NRG1 Present, and No Prior Chemo/Immunotherapy or No Alteration Present, PS = 0, 1, Initial  Chemotherapy/Immunotherapy, No Alteration Present, Immunotherapy Candidate, PD-L1 Expression Positive 1-49% (TPS) / Negative / Not Tested / Awaiting Test Results Therapeutic Status: Stage IV Metastatic Histology: Squamous Cell Molecular Analysis Results: No Alteration Present ECOG Performance Status: 1 Chemotherapy/Immunotherapy Line of Therapy: Initial Chemotherapy/Immunotherapy Immunotherapy Candidate Status: Candidate for Immunotherapy PD-L1 Expression Status: PD-L1 Negative Intent of Therapy: Non-Curative / Palliative Intent, Discussed with Patient

## 2023-09-12 ENCOUNTER — Other Ambulatory Visit: Payer: Self-pay

## 2023-09-13 MED ORDER — PROCHLORPERAZINE MALEATE 10 MG PO TABS: 10.0000 mg | ORAL_TABLET | Freq: Four times a day (QID) | ORAL | 1 refills | Status: AC | PRN

## 2023-09-13 MED ORDER — LIDOCAINE-PRILOCAINE 2.5-2.5 % EX CREA: TOPICAL_CREAM | CUTANEOUS | 3 refills | Status: DC

## 2023-09-13 MED ORDER — DEXAMETHASONE 4 MG PO TABS: ORAL_TABLET | ORAL | 1 refills | Status: AC

## 2023-09-13 MED ORDER — ONDANSETRON HCL 8 MG PO TABS
8.0000 mg | ORAL_TABLET | Freq: Three times a day (TID) | ORAL | 1 refills | Status: AC | PRN
Start: 2023-09-13 — End: ?

## 2023-09-13 NOTE — Patient Instructions (Signed)
 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  You will be treated with two immunotherapies and tow chemotherapies. Their names are Opdivo, Yervoy (immunotherapies) and Taxol and Carboplatin (chemotherapies).  The intent of treatment is palliative meaning to prevent the further spread and manage your symptoms.  You will see the doctor regularly throughout treatment.  We will obtain blood work from you prior to every treatment and monitor your results to make sure it is safe to give your treatment. The doctor monitors your response to treatment by the way you are feeling, your blood work, and by obtaining scans periodically.  There will be wait times while you are here for treatment.  It will take about 30 minutes to 1 hour for your lab work to result.  Then there will be wait times while pharmacy mixes your medications.     Medications you will receive in the clinic prior to your chemotherapy medications:   Aloxi:  ALOXI is used in adults to help prevent the nausea and vomiting that happens with certain chemotherapy drugs.  Aloxi is a long acting medication, and will remain in your system for about 2 days.    Dexamethasone :  This is a steroid given prior to chemotherapy to help prevent allergic reactions; it may also help prevent and control nausea and diarrhea.    Pepcid:  This medication is a histamine blocker that helps prevent and allergic reaction to your chemotherapy.    Benadryl:  This is a histamine blocker (different from the Pepcid) that helps prevent allergic/infusion reactions to your chemotherapy. This medication may cause dizziness/drowsiness.   Cinvanti:  Cinvanti is an anti-nausea medication used together with Aloxi to prevent nausea and vomiting that may be caused by chemotherapy.  Paclitaxel (Taxol)  About This Drug Paclitaxel is a drug used to treat cancer. It is given in the vein (IV).  This will take  1 hour to infuse.  This first infusion will take longer to infuse because it is increased slowly to monitor for reactions.  The nurse will be in the room with you for the first 15 minutes of the first infusion.  Possible Side Effects   Hair loss. Hair loss is often temporary, although with certain medicine, hair loss can sometimes be permanent. Hair loss may happen suddenly or gradually. If you lose hair, you may lose it from your head, face, armpits, pubic area, chest, and/or legs. You may also notice your hair getting thin.   Swelling of your legs, ankles and/or feet (edema)   Flushing   Nausea and throwing up (vomiting)   Loose bowel movements (diarrhea)   Bone marrow depression. This is a decrease in the number of white blood cells, red blood cells, and platelets. This may raise your risk of infection, make you tired and weak (fatigue), and raise your risk of bleeding.   Effects on the nerves are called peripheral neuropathy. You may feel numbness, tingling, or pain in your hands and feet. It may be hard for you to button your clothes, open jars, or walk as usual. The effect on the nerves may get worse with more doses of the drug. These effects get better in some people after the drug is stopped but it does not get better in all people.   Changes in your liver function   Bone, joint and muscle pain   Abnormal EKG   Allergic reaction: Allergic reactions, including anaphylaxis  are rare but may happen in some patients. Signs of allergic reaction to this drug may be swelling of the face, feeling like your tongue or throat are swelling, trouble breathing, rash, itching, fever, chills, feeling dizzy, and/or feeling that your heart is beating in a fast or not normal way. If this happens, do not take another dose of this drug. You should get urgent medical treatment.   Infection   Changes in your kidney function.  Note: Each of the side effects above was reported in 20% or greater of  patients treated with paclitaxel. Not all possible side effects are included above.  Warnings and Precautions   Severe allergic reactions   Severe bone marrow depression  Treating Side Effects   To help with hair loss, wash with a mild shampoo and avoid washing your hair every day.   Avoid rubbing your scalp, instead, pat your hair or scalp dry   Avoid coloring your hair   Limit your use of hair spray, electric curlers, blow dryers, and curling irons.   If you are interested in getting a wig, talk to your nurse. You can also call the [employer] at 800-ACS-2345 to find out information about the "Look Good, Feel Better" program close to where you live. It is a free program where women getting chemotherapy can learn about wigs, turbans and scarves as well as makeup techniques and skin and nail care.   Ask your doctor or nurse about medicines that are available to help stop or lessen diarrhea and/or nausea.   To help with nausea and vomiting, eat small, frequent meals instead of three large meals a day. Choose foods and drinks that are at room temperature. Ask your nurse or doctor about other helpful tips and medicine that is available to help or stop lessen these symptoms.   If you get diarrhea, eat low-fiber foods that are high in protein and calories and avoid foods that can irritate your digestive tracts or lead to cramping. Ask your nurse or doctor about medicine that can lessen or stop your diarrhea.   Mouth care is very important. Your mouth care should consist of routine, gentle cleaning of your teeth or dentures and rinsing your mouth with a mixture of 1/2 teaspoon of salt in 8 ounces of water  or  teaspoon of baking soda in 8 ounces of water . This should be done at least after each meal and at bedtime.   If you have mouth sores, avoid mouthwash that has alcohol. Also avoid alcohol and smoking because they can bother your mouth and throat.   Drink plenty of fluids (a  minimum of eight glasses per day is recommended).   Take your temperature as your doctor or nurse tells you, and whenever you feel like you may have a fever.   Talk to your doctor or nurse about precautions you can take to avoid infections and bleeding.   Be careful when cooking, walking, and handling sharp objects and hot liquids.  Food and Drug Interactions   There are no known interactions of paclitaxel with food.   This drug may interact with other medicines. Tell your doctor and pharmacist about all the medicines and dietary supplements (vitamins, minerals, herbs and others) that you are taking at this time.   The safety and use of dietary supplements and alternative diets are often not known. Using these might affect your cancer or interfere with your treatment. Until more is known, you should not use dietary supplements or alternative  diets without your cancer doctor's help.  When to Call the Doctor  Call your doctor or nurse if you have any of the following symptoms and/or any new or unusual symptoms:   Fever of 100.4 F (38 C) or above   Chills   Redness, pain, warmth, or swelling at the IV site during the infusion   Signs of allergic reaction: swelling of the face, feeling like your tongue or throat are swelling, trouble breathing, rash, itching, fever, chills, feeling dizzy, and/or feeling that your heart is beating in a fast or not normal way   Feeling that your heart is beating in a fast or not normal way (palpitations)   Weight gain of 5 pounds in one week (fluid retention)   Decreased urine or very dark urine   Signs of liver problems: dark urine, pale bowel movements, bad stomach pain, feeling very tired and weak, unusual  itching, or yellowing of the eyes or skin   Heavy menstrual period that lasts longer than normal   Easy bruising or bleeding   Nausea that stops you from eating or drinking, and/or that is not relieved by prescribed medicines.   Loose  bowel movements (diarrhea) more than 4 times a day or diarrhea with weakness or lightheadedness   Pain in your mouth or throat that makes it hard to eat or drink   Lasting loss of appetite or rapid weight loss of five pounds in a week   Signs of peripheral neuropathy: numbness, tingling, or decreased feeling in fingers or toes; trouble walking or changes in the way you walk; or feeling clumsy when buttoning clothes, opening jars, or other routine activities   Joint and muscle pain that is not relieved by prescribed medicines   Extreme fatigue that interferes with normal activities   While you are getting this drug, please tell your nurse right away if you have any pain, redness, or swelling at the site of the IV infusion.   If you think you are pregnant.   Carboplatin (Paraplatin, CBDCA)  About This Drug  Carboplatin is used to treat cancer. It is given in the vein (IV).  It will take 30 minutes to infuse.   Possible Side Effects   Bone marrow suppression. This is a decrease in the number of white blood cells, red blood cells, and platelets. This may raise your risk of infection, make you tired and weak (fatigue), and raise your risk of bleeding.   Nausea and vomiting (throwing up)   Weakness   Changes in your liver function   Changes in your kidney function   Electrolyte changes   Pain  Note: Each of the side effects above was reported in 20% or greater of patients treated with carboplatin. Not all possible side effects are included above.   Warnings and Precautions   Severe bone marrow suppression   Allergic reactions, including anaphylaxis are rare but may happen in some patients. Signs of allergic reaction to this drug may be swelling of the face, feeling like your tongue or throat are swelling, trouble breathing, rash, itching, fever, chills, feeling dizzy, and/or feeling that your heart is beating in a fast or not normal way. If this happens, do not take another  dose of this drug. You should get urgent medical treatment.   Severe nausea and vomiting   Effects on the nerves are called peripheral neuropathy. This risk is increased if you are over the age of 65 or if you have received other medicine with  risk of peripheral neuropathy. You may feel numbness, tingling, or pain in your hands and feet. It may be hard for you to button your clothes, open jars, or walk as usual. The effect on the nerves may get worse with more doses of the drug. These effects get better in some people after the drug is stopped but it does not get better in all people.   Blurred vision, loss of vision or other changes in eyesight   Decreased hearing   - Skin and tissue irritation including redness, pain, warmth, or swelling at the IV site if the drug leaks out of the vein and into nearby tissue.   Severe changes in your kidney function, which can cause kidney failure   Severe changes in your liver function, which can cause liver failure  Note: Some of the side effects above are very rare. If you have concerns and/or questions, please discuss them with your medical team.   Important Information   This drug may be present in the saliva, tears, sweat, urine, stool, vomit, semen, and vaginal secretions. Talk to your doctor and/or your nurse about the necessary precautions to take during this time.   Treating Side Effects   Manage tiredness by pacing your activities for the day.   Be sure to include periods of rest between energy-draining activities.   To decrease the risk of infection, wash your hands regularly.   Avoid close contact with people who have a cold, the flu, or other infections.   Take your temperature as your doctor or nurse tells you, and whenever you feel like you may have a fever.   To help decrease the risk of bleeding, use a soft toothbrush. Check with your nurse before using dental floss.   Be very careful when using knives or tools.   Use an  electric shaver instead of a razor.   Drink plenty of fluids (a minimum of eight glasses per day is recommended).   If you throw up or have loose bowel movements, you should drink more fluids so that you do not become dehydrated (lack of water  in the body from losing too much fluid).   To help with nausea and vomiting, eat small, frequent meals instead of three large meals a day. Choose foods and drinks that are at room temperature. Ask your nurse or doctor about other helpful tips and medicine that is available to help stop or lessen these symptoms.   If you have numbness and tingling in your hands and feet, be careful when cooking, walking, and handling sharp objects and hot liquids.   Keeping your pain under control is important to your well-being. Please tell your doctor or nurse if you are experiencing pain.   Food and Drug Interactions   There are no known interactions of carboplatin with food.   This drug may interact with other medicines. Tell your doctor and pharmacist about all the prescription and over-the-counter medicines and dietary supplements (vitamins, minerals, herbs and others) that you are taking at this time. Also, check with your doctor or pharmacist before starting any new prescription or over-the-counter medicines, or dietary supplements to make sure that there are no interactions.   When to Call the Doctor  Call your doctor or nurse if you have any of these symptoms and/or any new or unusual symptoms:   Fever of 100.4 F (38 C) or higher   Chills   Tiredness that interferes with your daily activities   Feeling dizzy or lightheaded  Easy bleeding or bruising   Nausea that stops you from eating or drinking and/or is not relieved by prescribed medicines   Throwing up   Blurred vision or other changes in eyesight   Decrease in hearing or ringing in the ear   Signs of allergic reaction: swelling of the face, feeling like your tongue or throat are  swelling, trouble breathing, rash, itching, fever, chills, feeling dizzy, and/or feeling that your heart is beating in a fast or not normal way. If this happens, call 911 for emergency care.   Signs of possible liver problems: dark urine, pale bowel movements, bad stomach pain, feeling very tired and weak, unusual itching, or yellowing of the eyes or skin   Decreased urine, or very dark urine   Numbness, tingling, or pain in your hands and feet   Pain that does not go away or is not relieved by prescribed medicine   While you are getting this drug, please tell your nurse right away if you have any pain, redness, or swelling at the site of the IV infusion, or if you have any new onset of symptoms, or if you just feel different from before when the infusion was started.   Reproduction Warnings   Pregnancy warning: This drug may have harmful effects on the unborn baby. Women of child bearing potential should use effective methods of birth control during your cancer treatment. Let your doctor know right away if you think you may be pregnant.   Breastfeeding warning: It is not known if this drug passes into breast milk. For this reason, women should not breastfeed during treatment because this drug could enter the breast milk and cause harm to a breastfeeding baby.   Fertility warning: Human fertility studies have not been done with this drug. Talk with your doctor or nurse if you plan to have children. Ask for information on sperm or egg banking.     Nivolumab (Opdivo)   About This Drug   Nivolumab is used to treat cancer. It is given in the vein (IV).   It helps your immune system work properly to target and kill cancer cells. It will take 30 minutes to infuse.     These are some of the most common or important side effects:    Immune Reactions    This medication stimulates your immune system. Your immune system can attack normal organs and tissues in your body, leading to serious  or life threatening complications. It is important to notify your healthcare provider right away if you develop any of the following symptoms:    Diarrhea / Intestinal problems (colitis, inflammation of the bowel): Abdominal pain, diarrhea, cramping, mucus or blood in the stool, dark or tar-like stools, fever. Diarrhea means different things to different people. Any increase in your normal bowel patterns can be defined as diarrhea and should be reported to your healthcare team.   Skin reactions: Report rash, with or without itching (pruritis), sores in your mouth, blistering or peeling skin, as these can become severe and require treatment with corticosteroids.   Lung problems (pneumonitis, inflammation of the lung): New or worsening cough, shortness of breath, trouble breathing, or chest pain.   Liver problems (hepatitis, inflammation of the liver): Yellowing of the skin or eyes, your urine appears dark or brown, pain in your abdomen, bleeding or bruising more easily than normal, or severe nausea and vomiting.    Brain and/or nerve problems: Report any headache, drooping of eyelids, double vision, trouble swallowing,  weakness of arms, legs or face, or numbness or tingling in the hands or feet to your healthcare team.   Hormone abnormalities: Immune reactions can affect the pituitary, thyroid , pancreas and adrenal glands, resulting in inflammation of these glands, which can affect their production of certain hormones. Some hormone levels can be monitored with blood work. It is important that you report any changes in how you are feeling to your care team. Symptoms of these hormonal changes can include: headaches, nausea, vomiting, constipation, rapid heart rate, increased sweating, extreme fatigue, weakness, changes in your voice, changes in memory and concentration, increased hunger or thirst, increased urination, weight gain, hair loss, dizziness, feeling cold all the time, and changes in mood or  behavior (including irritability, forgetfulness and decreased sex drive).     Eye problems: Report any changes in vision, blurry or double vision, and eye pain or redness to your healthcare team.  Kidney problems (kidney inflammation or failure): Decreased urine output, blood in the urine, swelling in the ankles, loss of appetite.   Fatigue:  Fatigue is very common during cancer treatment and is an overwhelming feeling of exhaustion that is not usually relieved by rest. While on cancer treatment, and for a period after, you may need to adjust your schedule to manage fatigue. Plan times to rest during the day and conserve energy for more important activities. Exercise can help combat fatigue; a simple daily walk with a friend can help.  Talk to your healthcare team for helpful tips on dealing with this side effect.   Allergic Reactions:  In some cases, patients can have an allergic reaction to this medication. Signs of a reaction can include: shortness of breath or difficulty breathing, chest pain, rash, flushing or itching or a decrease in blood pressure. If you notice any changes in how you feel during the infusion, let your nurse know immediately. The infusion will be slowed or stopped if this occurs.    Less common, but important side effects can include:   Allogenic Stem Cell Transplant Reactions: Patients who receive this medication before or after having an allogenic stem cell transplant can be at an increased risk of graft vs. host disease, veno-occlusive disease and fever syndrome. You providers will monitor you closely for these side effects.    Reproductive Concerns  Exposure of an unborn child to this medication could cause birth defects, so you should not become pregnant or father a child while on this medication. Even if your menstrual cycle stops or you believe you are not producing sperm, effective birth control is necessary during treatment and for 5 months after stopping treatment. You  should not breastfeed while taking this medication or for five months after the end of treatment.    Important Information    This drug may be present in the saliva, tears, sweat, urine, stool, vomit, semen, and vaginal secretions. Talk to your doctor and/or your nurse about the necessary precautions to take during this time.   Treating Side Effects    To decrease infection, wash your hands regularly    Avoid close contact with people who have a cold, the flu, or other infections.    Take your temperature as your doctor or nurse tells you, and whenever you feel like you may have a fever    To help decrease bleeding, use a soft toothbrush. Check with your nurse before using dental floss.    Be very careful when using knives or tools    Use an electric  shaver instead of a razor    Ask your doctor or nurse about medicines that are available to help stop or lessen constipation, diarrhea and/or nausea.    Drink plenty of fluids (a minimum of eight glasses per day is recommended).    If you are not able to move your bowels, check with your doctor or nurse before you use any enemas, laxatives, or suppositories    To help with nausea and vomiting, eat small, frequent meals instead of three large meals a day. Choose foods and drinks that are at room temperature. Ask your nurse or doctor about other helpful tips and medicine that is available to help or stop lessen these symptoms.    If you get diarrhea, eat low-fiber foods that are high in protein and calories and avoid foods that can irritate your digestive tracts or lead to cramping. Ask your nurse or doctor about medicine that can lessen or stop your diarrhea.    To help with decreased appetite, eat small, frequent meals    Eat high caloric food such as pudding, ice cream, yogurt and milkshakes.    Manage tiredness by pacing your activities for the day. Be sure to include periods of rest between energy-draining activities    Keeping  your pain under control is important to your wellbeing. Please tell your doctor or nurse if you are experiencing pain.    If you have diabetes, keep good control of your blood sugar level. Tell your nurse or your doctor if your glucose levels are higher or lower than normal    If you get a rash do not put anything on it unless your doctor or nurse says you may. Keep the area around the rash clean and dry. Ask your doctor for medicine if your rash bothers you.    Infusion reactions may occur after your infusion. If this happens, call 911 for emergency care.   Food and Drug Interactions    There are no known interactions of nivolumab with food or other medications.    Tell your doctor and pharmacist about all the medicines and dietary supplements (vitamins, minerals, herbs and others) that you are taking at this time. The safety and use of dietary supplements and alternative diets are often not known. Using these might affect your cancer or interfere with your treatment. Until more is known, you should not use dietary supplements or alternative diets without your cancer doctor's help.   When to Call the Doctor   Call your doctor or nurse if you have any of these symptoms and/or any new or unusual symptoms:    Fever of 100.4 F (38 C) or higher    Chills    Easy bleeding or bruising    Wheezing or trouble breathing    Dry cough, or cough with yellow, green or bloody mucus    Confusion and/or agitation    Hallucinations    Trouble understanding or speaking    Blurry vision or changes in your eyesight    Numbness or lack of strength to your arms, legs, face, or body    Feeling dizzy or lightheaded    Loose bowel movements (diarrhea) more than 4 times a day or diarrhea with weakness or lightheadedness    Nausea that stops you from eating or drinking, and/or that is not relieved by prescribed medicines    Lasting loss of appetite or rapid weight loss of five pounds in a week     No bowel movement for 3  days or you feel uncomfortable    Bad abdominal pain, especially in upper right area    Fatigue or extreme weakness that interferes with normal activities    Decreased urine    Unusual thirst or passing urine often    Rash that is not relieved by prescribed medicines    Rash or itching    Flu-like symptoms: fever, headache, muscle and joint aches, and fatigue (low energy, feeling weak)    Signs of liver problems: dark urine, pale bowel movements, bad stomach pain, feeling very tired and weak, unusual itching, or yellowing of the eyes or skin.    Signs of infusion reaction: fever or shaking chills, flushing, facial swelling, feeling dizzy, headache, trouble breathing, rash, itching, chest tightness, or chest pain.    If you think you may be pregnant   Reproduction Warnings    Pregnancy warning: This drug can have harmful effects on the unborn baby. Women of child bearing potential should use effective methods of birth control during your cancer treatment and for at least 5 months after treatment. Let your doctor know right away if you think you may be pregnant.    Breastfeeding warning: It is not known if this drug passes into breast milk. For this reason, women should not breast feed during treatment because this drug could enter the breast milk and cause harm to a breast feeding baby.    Fertility warning: Human fertility studies have not been done with this drug. Talk with your doctor or nurse if you plan to have children. Ask for information on sperm or egg banking.     Euph[employer]Ferris        Generic name : Ipilimumab    How Yervoy  Is Given:  Berton is given as an intravenous injection through a vein (IV). It helps your immune system work properly to target and kill cancer cells.  It will take 30 minutes to infuse.    Immune Reactions  This medication stimulates your immune system. Your immune system can attack normal organs and tissues in your body, leading  to serious or life threatening complications. It is important to notify your healthcare provider right away if you develop any of the following symptoms:    Diarrhea / Intestinal problems (colitis, inflammation of the bowel): Abdominal pain, diarrhea, cramping, mucus or blood in the stool, dark or tar-like stools, fever. Diarrhea means different things to different people. Any increase in your normal bowel patterns can be defined as diarrhea and should be reported to your healthcare team.   Bowel obstruction or perforation: Abdominal pain, fever, constipation, bloating and cramping. Any decrease in your normal bowel patterns should be reported.    Skin reactions: Report rash, with or without itching (pruritis), sores in your mouth, blistering or peeling skin, as these can become severe and require treatment with corticosteroids.   Liver problems (hepatitis, inflammation of the liver): Yellowing of the skin or eyes, your urine appears dark or brown, pain in your abdomen, bleeding or bruising more easily than normal, or severe nausea and vomiting.   Brain and/or nerve problems: Report any headache, drooping of eyelids, double vision, trouble swallowing, weakness of arms, legs or face, or numbness or tingling in the hands or feet to your healthcare team.   Eye problems: Report any changes in vision, blurry or double vision, and eye pain or redness.    Lung problems (pneumonitis, inflammation of the lung): New or worsening cough, shortness of breath, trouble breathing, or chest pain.  Kidney problems: (kidney inflammation or failure):  Decreased urine output, blood in the urine, swelling in the ankles, loss of appetite.    Hormone abnormalities: Immune reactions can affect the pituitary, thyroid , pancreas and adrenal glands, resulting in inflammation of these glands, which can affect their production of certain hormones. Some hormone levels can be monitored with blood work. It is important that you  report any changes in how you are feeling to your care team. Symptoms of these hormonal changes can include: headaches, nausea, vomiting, constipation, rapid heart rate, increased sweating, extreme fatigue, weakness, changes in your voice, changes in memory and concentration, increased hunger or thirst, increased urination, weight gain, hair loss, dizziness, feeling cold all the time, and changes in mood or behavior (including irritability, forgetfulness and decreased sex drive).    Infusion-Related Side Effects: The infusion can cause a reaction that may lead to chills, fever, low blood pressure, dizziness, feeling like you are going to pass out and difficulty breathing. Let your nurse know right away if you are experiencing any changes in how you are feeling.    Fatigue  Fatigue is very common during cancer treatment and is an overwhelming feeling of exhaustion that is not usually relieved by rest. While on cancer treatment, and for a period after, you may need to adjust your schedule to manage fatigue. Plan times to rest during the day and conserve energy for more important activities. Exercise can help combat fatigue; a simple daily walk with a friend can help. Talk to your healthcare team for helpful tips on dealing with this side effect.    Reproductive Concerns  Exposure of an unborn child to this medication could cause birth defects, so you should not become pregnant or father a child while on this medication. Effective birth control is necessary during treatment and for 3 months after the last dose. Even if your menstrual cycle stops or you believe you are not producing sperm, you could still be fertile and conceive. You should not breastfeed while receiving this medication and for 3 months after the last dose.      When to Call the Doctor   Call your doctor or nurse if you have any of these symptoms and/or any new or unusual symptoms:    Fever of 100.4 F (38 C) or higher    Chills     Easy bleeding or bruising    Wheezing or trouble breathing    Dry cough, or cough with yellow, green or bloody mucus    Confusion and/or agitation    Hallucinations    Trouble understanding or speaking    Blurry vision or changes in your eyesight    Numbness or lack of strength to your arms, legs, face, or body    Feeling dizzy or lightheaded    Loose bowel movements (diarrhea) more than 4 times a day or diarrhea with weakness or lightheadedness    Nausea that stops you from eating or drinking, and/or that is not relieved by prescribed medicines    Lasting loss of appetite or rapid weight loss of five pounds in a week    No bowel movement for 3 days or you feel uncomfortable    Bad abdominal pain, especially in upper right area    Fatigue or extreme weakness that interferes with normal activities    Decreased urine    Unusual thirst or passing urine often    Rash that is not relieved by prescribed medicines    Rash or itching  Flu-like symptoms: fever, headache, muscle and joint aches, and fatigue (low energy, feeling weak)    Signs of liver problems: dark urine, pale bowel movements, bad stomach pain, feeling very tired and weak, unusual itching, or yellowing of the eyes or skin.    Signs of infusion reaction: fever or shaking chills, flushing, facial swelling, feeling dizzy, headache, trouble breathing, rash, itching, chest tightness, or chest pain.      SELF CARE ACTIVITIES WHILE ON IMMUNOTHERAPY:   Hydration Increase your fluid intake 48 hours prior to treatment and drink at least 8 to 12 cups (64 ounces) of water /decaffeinated beverages per day after treatment. You can still have your cup of coffee or soda but these beverages do not count as part of your 8 to 12 cups that you need to drink daily. No alcohol intake.   Medications Continue taking your normal prescription medication as prescribed.  If you start any new herbal or new supplements please let us   know first to make sure it is safe.   Infection Prevention Please wash your hands for at least 30 seconds using warm soapy water . Handwashing is the #1 way to prevent the spread of germs. Stay away from sick people or people who are getting over a cold. If you develop respiratory systems such as green/yellow mucus production or productive cough or persistent cough let us  know and we will see if you need an antibiotic. It is a good idea to keep a pair of gloves on when going into grocery stores/[employer] to decrease your risk of coming into contact with germs on the carts, etc. Carry alcohol hand gel with you at all times and use it frequently if out in public. If your temperature reaches 100.4 or higher please call the clinic and let us  know.  If it is after hours or on the weekend please go to the ER if your temperature is over 100.4.  Please have your own personal thermometer at home to use.     Sex and bodily fluids If you are going to have sex, a condom must be used to protect the person that isn't taking immunotherapy. For a few days after treatment, immunotherapy can be excreted through your bodily fluids.  When using the toilet please close the lid and flush the toilet twice.  Do this for a few day after you have had immunotherapy.    Contraception It is not known for sure whether or not immunotherapy drugs can be passed on through semen or secretions from the vagina. Because of this some doctors advise people to use a barrier method if you have sex during treatment. This applies to vaginal, anal or oral sex.   Generally, doctors advise a barrier method only for the time you are actually having the treatment and for about a week after your treatment.   Advice like this can be worrying, but this does not mean that you have to avoid being intimate with your partner. You can still have close contact with your partner and continue to enjoy sex.   Animals If you have cats or birds we just ask that  you not change the litter or change the cage.  Please have someone else do this for you while you are on immunotherapy.    Food Safety During and After Cancer Treatment Food safety is important for people both during and after cancer treatment. Cancer and cancer treatments, such as chemotherapy, radiation therapy, and stem cell/bone marrow transplantation, often weaken the immune system.  This makes it harder for your body to protect itself from foodborne illness, also called food poisoning. Foodborne illness is caused by eating food that contains harmful bacteria, parasites, or viruses.   Foods to avoid Some foods have a higher risk of becoming tainted with bacteria. These include: Unwashed fresh fruit and vegetables, especially leafy vegetables that can hide dirt and other contaminants Raw sprouts, such as alfalfa sprouts Raw or undercooked beef, especially ground beef, or other raw or undercooked meat and poultry Fatty, fried, or spicy foods immediately before or after treatment.  These can sit heavy on your stomach and make you feel nauseous. Raw or undercooked shellfish, such as oysters. Sushi and sashimi, which often contain raw fish.  Unpasteurized beverages, such as unpasteurized fruit juices, raw milk, raw yogurt, or cider Undercooked eggs, such as soft boiled, over easy, and poached; raw, unpasteurized eggs; or foods made with raw egg, such as homemade raw cookie dough and homemade mayonnaise   Simple steps for food safety   Shop smart. Do not buy food stored or displayed in an unclean area. Do not buy bruised or damaged fruits or vegetables. Do not buy cans that have cracks, dents, or bulges. Pick up foods that can spoil at the end of your shopping trip and store them in a cooler on the way home.   Prepare and clean up foods carefully. Rinse all fresh fruits and vegetables under running water , and dry them with a clean towel or paper towel. Clean the top of cans before opening  them. After preparing food, wash your hands for 20 seconds with hot water  and soap. Pay special attention to areas between fingers and under nails. Clean your utensils and dishes with hot water  and soap. Disinfect your kitchen and cutting boards using 1 teaspoon of liquid, unscented bleach mixed into 1 quart of water .     Dispose of old food. Eat canned and packaged food before its expiration date (the "use by" or "best before" date). Consume refrigerated leftovers within 3 to 4 days. After that time, throw out the food. Even if the food does not smell or look spoiled, it still may be unsafe. Some bacteria, such as Listeria, can grow even on foods stored in the refrigerator if they are kept for too long.   Take precautions when eating out. At restaurants, avoid buffets and salad bars where food sits out for a long time and comes in contact with many people. Food can become contaminated when someone with a virus, often a norovirus, or another "bug" handles it. Put any leftover food in a "to-go" container yourself, rather than having the server do it. And, refrigerate leftovers as soon as you get home. Choose restaurants that are clean and that are willing to prepare your food as you order it cooked.

## 2023-09-14 DIAGNOSIS — C3491 Malignant neoplasm of unspecified part of right bronchus or lung: Secondary | ICD-10-CM | POA: Diagnosis not present

## 2023-09-18 ENCOUNTER — Inpatient Hospital Stay

## 2023-09-18 ENCOUNTER — Inpatient Hospital Stay: Attending: Oncology

## 2023-09-18 DIAGNOSIS — Z79899 Other long term (current) drug therapy: Secondary | ICD-10-CM | POA: Insufficient documentation

## 2023-09-18 DIAGNOSIS — Z5111 Encounter for antineoplastic chemotherapy: Secondary | ICD-10-CM | POA: Insufficient documentation

## 2023-09-18 DIAGNOSIS — M2550 Pain in unspecified joint: Secondary | ICD-10-CM | POA: Insufficient documentation

## 2023-09-18 DIAGNOSIS — Z7952 Long term (current) use of systemic steroids: Secondary | ICD-10-CM | POA: Insufficient documentation

## 2023-09-18 DIAGNOSIS — T451X5A Adverse effect of antineoplastic and immunosuppressive drugs, initial encounter: Secondary | ICD-10-CM | POA: Diagnosis not present

## 2023-09-18 DIAGNOSIS — C3491 Malignant neoplasm of unspecified part of right bronchus or lung: Secondary | ICD-10-CM | POA: Insufficient documentation

## 2023-09-18 DIAGNOSIS — L271 Localized skin eruption due to drugs and medicaments taken internally: Secondary | ICD-10-CM | POA: Insufficient documentation

## 2023-09-18 DIAGNOSIS — Z79620 Long term (current) use of immunosuppressive biologic: Secondary | ICD-10-CM | POA: Insufficient documentation

## 2023-09-18 DIAGNOSIS — Z5189 Encounter for other specified aftercare: Secondary | ICD-10-CM | POA: Diagnosis not present

## 2023-09-18 DIAGNOSIS — Z5112 Encounter for antineoplastic immunotherapy: Secondary | ICD-10-CM | POA: Insufficient documentation

## 2023-09-18 DIAGNOSIS — R197 Diarrhea, unspecified: Secondary | ICD-10-CM | POA: Insufficient documentation

## 2023-09-18 DIAGNOSIS — M898X9 Other specified disorders of bone, unspecified site: Secondary | ICD-10-CM | POA: Diagnosis not present

## 2023-09-18 DIAGNOSIS — N3000 Acute cystitis without hematuria: Secondary | ICD-10-CM | POA: Diagnosis not present

## 2023-09-18 LAB — CBC WITH DIFFERENTIAL/PLATELET
Abs Immature Granulocytes: 0.03 K/uL (ref 0.00–0.07)
Basophils Absolute: 0.1 K/uL (ref 0.0–0.1)
Basophils Relative: 1 %
Eosinophils Absolute: 0.3 K/uL (ref 0.0–0.5)
Eosinophils Relative: 4 %
HCT: 36.9 % (ref 36.0–46.0)
Hemoglobin: 12.3 g/dL (ref 12.0–15.0)
Immature Granulocytes: 0 %
Lymphocytes Relative: 24 %
Lymphs Abs: 2.1 K/uL (ref 0.7–4.0)
MCH: 30.1 pg (ref 26.0–34.0)
MCHC: 33.3 g/dL (ref 30.0–36.0)
MCV: 90.4 fL (ref 80.0–100.0)
Monocytes Absolute: 0.8 K/uL (ref 0.1–1.0)
Monocytes Relative: 9 %
Neutro Abs: 5.3 K/uL (ref 1.7–7.7)
Neutrophils Relative %: 62 %
Platelets: 323 K/uL (ref 150–400)
RBC: 4.08 MIL/uL (ref 3.87–5.11)
RDW: 12.5 % (ref 11.5–15.5)
WBC: 8.5 K/uL (ref 4.0–10.5)
nRBC: 0 % (ref 0.0–0.2)

## 2023-09-18 LAB — TSH: TSH: 2.263 u[IU]/mL (ref 0.350–4.500)

## 2023-09-18 LAB — COMPREHENSIVE METABOLIC PANEL WITH GFR
ALT: 16 U/L (ref 0–44)
AST: 17 U/L (ref 15–41)
Albumin: 3.8 g/dL (ref 3.5–5.0)
Alkaline Phosphatase: 73 U/L (ref 38–126)
Anion gap: 9 (ref 5–15)
BUN: 15 mg/dL (ref 8–23)
CO2: 24 mmol/L (ref 22–32)
Calcium: 9.3 mg/dL (ref 8.9–10.3)
Chloride: 103 mmol/L (ref 98–111)
Creatinine, Ser: 0.65 mg/dL (ref 0.44–1.00)
GFR, Estimated: >60 mL/min (ref >60)
Glucose, Bld: 89 mg/dL (ref 70–99)
Potassium: 4.2 mmol/L (ref 3.5–5.1)
Sodium: 136 mmol/L (ref 135–145)
Total Bilirubin: 0.8 mg/dL (ref 0.0–1.2)
Total Protein: 7 g/dL (ref 6.5–8.1)

## 2023-09-18 IMAGING — MG MM DIGITAL SCREENING BILAT W/ TOMO AND CAD
8 series · 8 of 24 positions shown · non-contrast
Comparison: Previous exam(s).

ACR Breast Density Category a: The breast tissue is almost entirely
fatty.

CLINICAL DATA: Screening.

EXAM:
DIGITAL SCREENING BILATERAL MAMMOGRAM WITH TOMOSYNTHESIS AND CAD
TECHNIQUE: Bilateral screening digital craniocaudal and mediolateral oblique
mammograms were obtained. Bilateral screening digital breast
tomosynthesis was performed. The images were evaluated with
computer-aided detection.

[R MLO synth-2D]
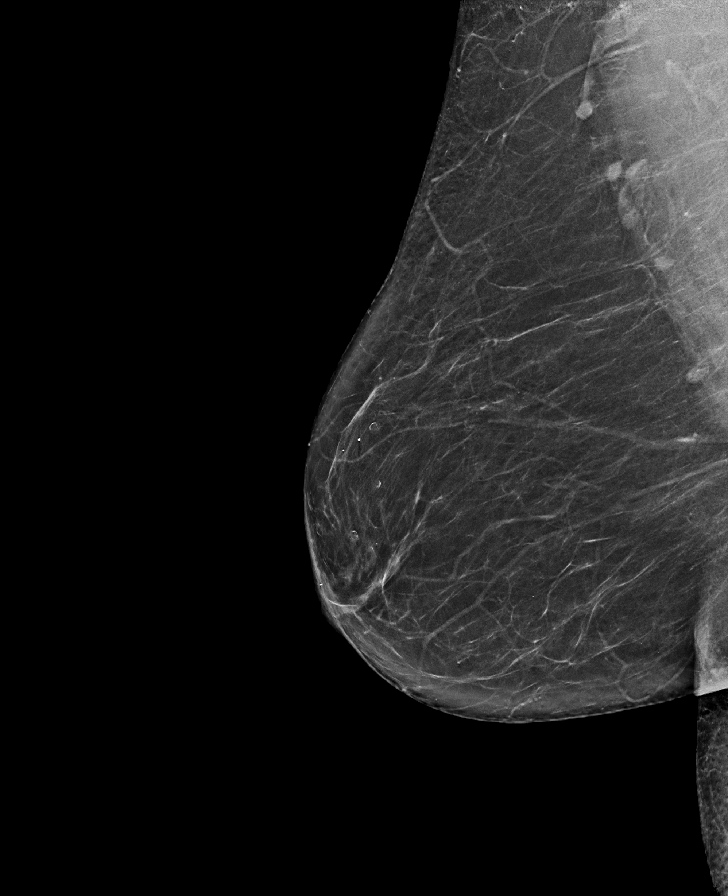

[L MLO synth-2D]
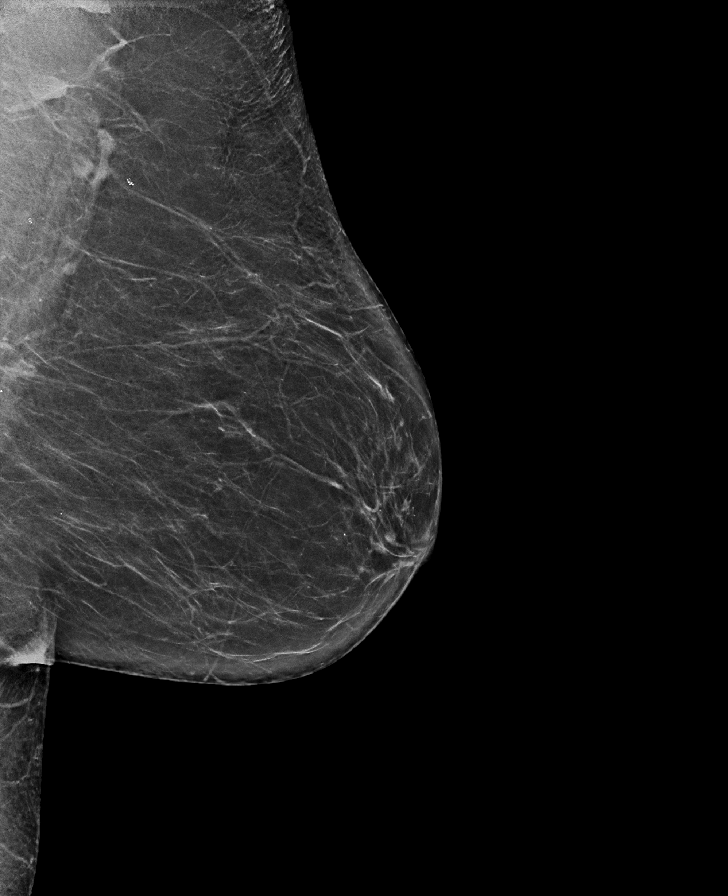

[L CC synth-2D]
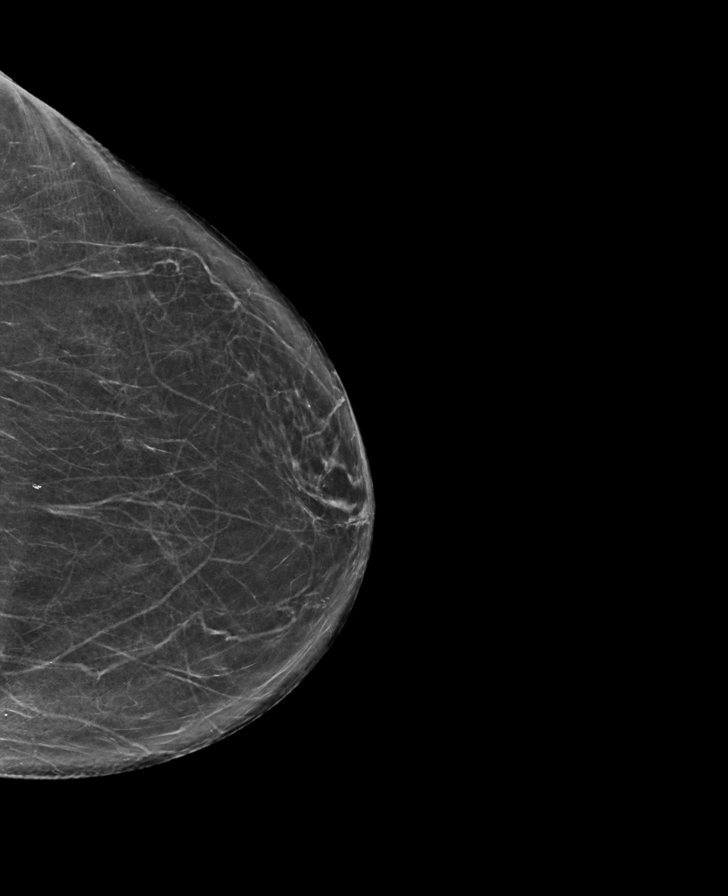

[R CC synth-2D]
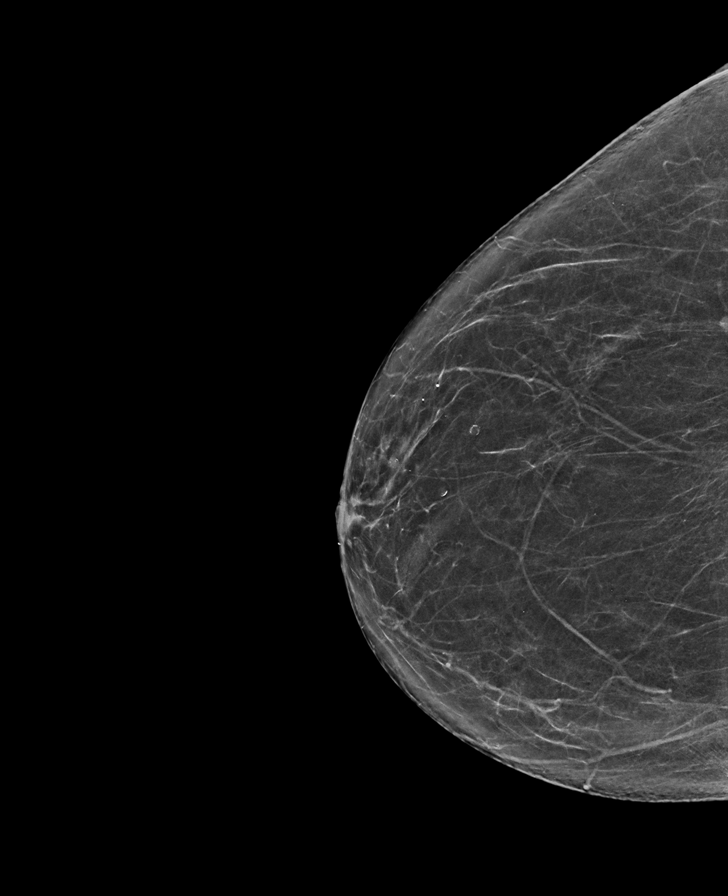

[R CC tomo · tomo slice 33/64.0]
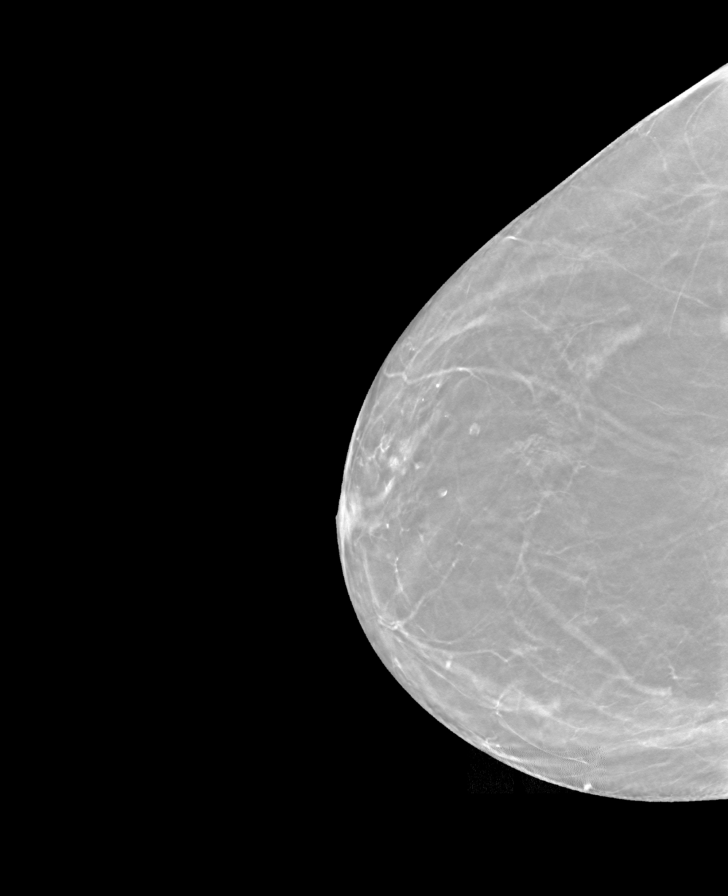

[R MLO tomo · tomo slice 37/72.0]
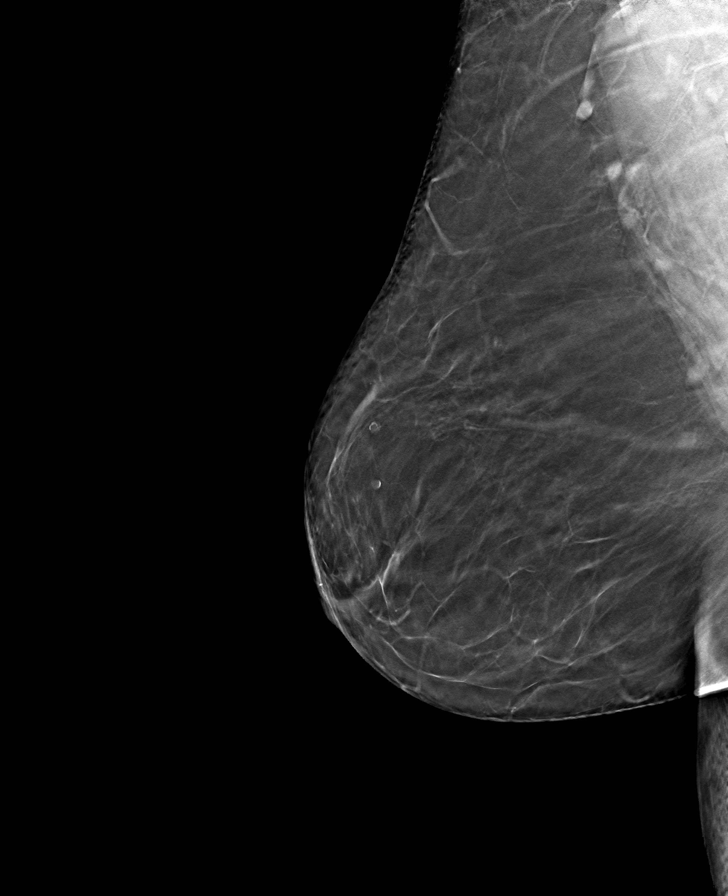

[L CC tomo · tomo slice 34/67.0]
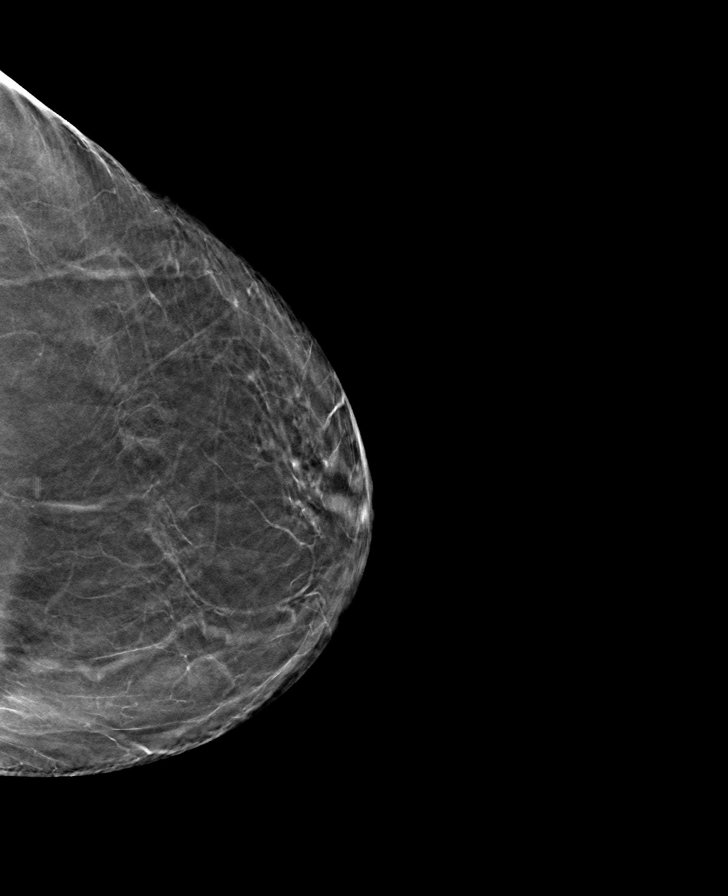

[L MLO tomo · tomo slice 35/70.0]
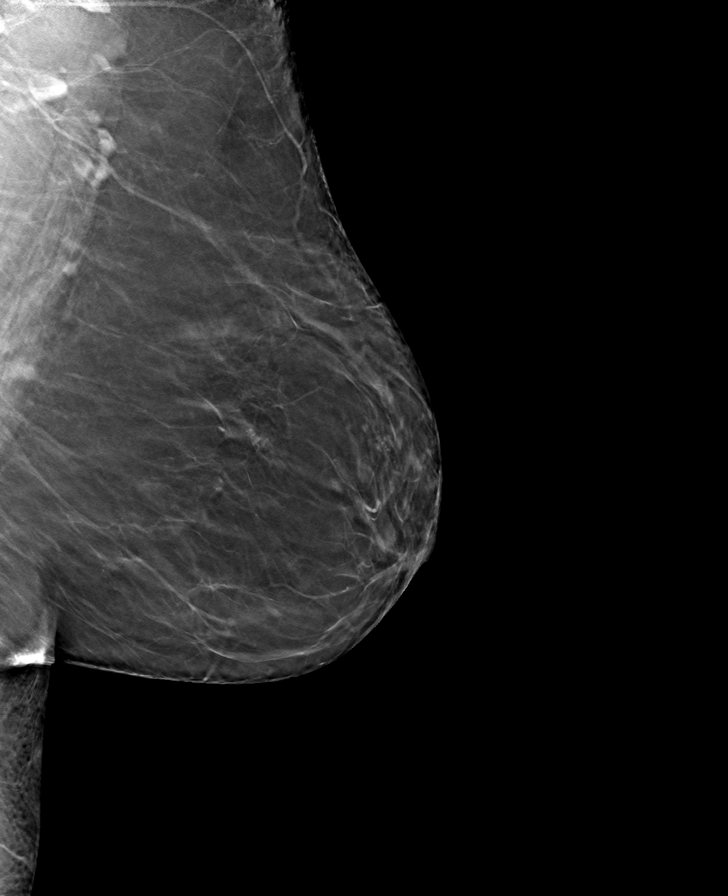

[8 of 24 positions shown; findings below may reference images not displayed]

FINDINGS: There are no findings suspicious for malignancy.
IMPRESSION: No mammographic evidence of malignancy. A result letter of this
screening mammogram will be mailed directly to the patient.

RECOMMENDATION:
Screening mammogram in one year. (Code:[id number]-[id number])

BI-RADS CATEGORY  1: Negative.

## 2023-09-18 NOTE — Progress Notes (Signed)

## 2023-09-18 NOTE — Progress Notes (Signed)
 Heather [name] presented for Portacath access and flush with lab work for [employer].   Portacath located right chest wall accessed with  H 20 needle.  Good blood return present. Portacath flushed with 20ml NS and needle removed intact.  Procedure tolerated well and without incident. Discharged from clinic ambulatory in stable condition. Alert and oriented x 3. F/U with [facility] as scheduled.

## 2023-09-19 LAB — T4: T4, Total: 9.8 ug/dL (ref 4.5–12.0)

## 2023-09-20 ENCOUNTER — Inpatient Hospital Stay

## 2023-09-20 VITALS — BP 128/70 | HR 76 | Temp 97.8°F | Resp 18 | Wt 175.0 lb

## 2023-09-20 DIAGNOSIS — Z5111 Encounter for antineoplastic chemotherapy: Secondary | ICD-10-CM | POA: Diagnosis not present

## 2023-09-20 DIAGNOSIS — C3491 Malignant neoplasm of unspecified part of right bronchus or lung: Secondary | ICD-10-CM

## 2023-09-20 DIAGNOSIS — M2550 Pain in unspecified joint: Secondary | ICD-10-CM | POA: Diagnosis not present

## 2023-09-20 MED ORDER — ACETAMINOPHEN 325 MG PO TABS
650.0000 mg | ORAL_TABLET | Freq: Once | ORAL | Status: AC
  Administered 2023-09-20: 650 mg via ORAL
  Filled 2023-09-20: qty 2

## 2023-09-20 MED ORDER — SODIUM CHLORIDE 0.9 % IV SOLN
1.0000 mg/kg | Freq: Once | INTRAVENOUS | Status: AC
  Administered 2023-09-20: 80 mg via INTRAVENOUS
  Filled 2023-09-20: qty 16

## 2023-09-20 MED ORDER — DEXAMETHASONE SODIUM PHOSPHATE 10 MG/ML IJ SOLN
10.0000 mg | Freq: Once | INTRAMUSCULAR | Status: AC
  Administered 2023-09-20: 10 mg via INTRAVENOUS
  Filled 2023-09-20: qty 1

## 2023-09-20 MED ORDER — APREPITANT 130 MG/18ML IV EMUL
130.0000 mg | Freq: Once | INTRAVENOUS | Status: AC
  Administered 2023-09-20: 130 mg via INTRAVENOUS
  Filled 2023-09-20: qty 18

## 2023-09-20 MED ORDER — PALONOSETRON HCL INJECTION 0.25 MG/5ML
0.2500 mg | Freq: Once | INTRAVENOUS | Status: AC
  Administered 2023-09-20: 0.25 mg via INTRAVENOUS
  Filled 2023-09-20: qty 5

## 2023-09-20 MED ORDER — SODIUM CHLORIDE 0.9 % IV SOLN
200.0000 mg/m2 | Freq: Once | INTRAVENOUS | Status: AC
  Administered 2023-09-20: 378 mg via INTRAVENOUS
  Filled 2023-09-20: qty 63

## 2023-09-20 MED ORDER — DIPHENHYDRAMINE HCL 50 MG/ML IJ SOLN
50.0000 mg | Freq: Once | INTRAMUSCULAR | Status: AC
  Administered 2023-09-20: 50 mg via INTRAVENOUS
  Filled 2023-09-20: qty 1

## 2023-09-20 MED ORDER — SODIUM CHLORIDE 0.9 % IV SOLN: INTRAVENOUS | Status: DC

## 2023-09-20 MED ORDER — FAMOTIDINE IN NACL 20-0.9 MG/50ML-% IV SOLN
20.0000 mg | Freq: Once | INTRAVENOUS | Status: AC
  Administered 2023-09-20: 20 mg via INTRAVENOUS
  Filled 2023-09-20: qty 50

## 2023-09-20 MED ORDER — SODIUM CHLORIDE 0.9 % IV SOLN
549.0000 mg | Freq: Once | INTRAVENOUS | Status: AC
  Administered 2023-09-20: 550 mg via INTRAVENOUS
  Filled 2023-09-20: qty 55

## 2023-09-20 MED ORDER — SODIUM CHLORIDE 0.9 % IV SOLN
360.0000 mg | Freq: Once | INTRAVENOUS | Status: AC
  Administered 2023-09-20: 360 mg via INTRAVENOUS
  Filled 2023-09-20: qty 24

## 2023-09-20 NOTE — Patient Instructions (Signed)
 [facility] - A DEPT OF [place]  Discharge Instructions: Thank you for choosing [facility] to provide your oncology and hematology care.  If you have a lab appointment with the Cancer Center - please note that after April 8th, 2024, all labs will be drawn in the cancer center.  You do not have to check in or register with the main entrance as you have in the past but will complete your check-in in the cancer center.  Wear comfortable clothing and clothing appropriate for easy access to any Portacath or PICC line.   We strive to give you quality time with your provider. You may need to reschedule your appointment if you arrive late (15 or more minutes).  Arriving late affects you and other patients whose appointments are after yours.  Also, if you miss three or more appointments without notifying the office, you may be dismissed from the clinic at the provider's discretion.      For prescription refill requests, have your pharmacy contact our office and allow 72 hours for refills to be completed.    Today you received the following chemotherapy and/or immunotherapy agents opdivo, yervoy, taxol , carboplatin      To help prevent nausea and vomiting after your treatment, we encourage you to take your nausea medication as directed.  BELOW ARE SYMPTOMS THAT SHOULD BE REPORTED IMMEDIATELY: *FEVER GREATER THAN 100.4 F (38 C) OR HIGHER *CHILLS OR SWEATING *NAUSEA AND VOMITING THAT IS NOT CONTROLLED WITH YOUR NAUSEA MEDICATION *UNUSUAL SHORTNESS OF BREATH *UNUSUAL BRUISING OR BLEEDING *URINARY PROBLEMS (pain or burning when urinating, or frequent urination) *BOWEL PROBLEMS (unusual diarrhea, constipation, pain near the anus) TENDERNESS IN MOUTH AND THROAT WITH OR WITHOUT PRESENCE OF ULCERS (sore throat, sores in mouth, or a toothache) UNUSUAL RASH, SWELLING OR PAIN  UNUSUAL VAGINAL DISCHARGE OR ITCHING   Items with * indicate a potential emergency  and should be followed up as soon as possible or go to the Emergency Department if any problems should occur.  Please show the CHEMOTHERAPY ALERT CARD or IMMUNOTHERAPY ALERT CARD at check-in to the Emergency Department and triage nurse.  Should you have questions after your visit or need to cancel or reschedule your appointment, please contact [facility]  - A DEPT OF [facility] [phone]  and follow the prompts.  Office hours are 8:00 a.m. to 4:30 p.m. Monday - Friday. Please note that voicemails left after 4:00 p.m. may not be returned until the following business day.  We are closed weekends and major holidays. You have access to a nurse at all times for urgent questions. Please call the main number to the clinic [phone] and follow the prompts.  For any non-urgent questions, you may also contact your provider using MyChart. We now offer e-Visits for anyone 74 and older to request care online for non-urgent symptoms. For details visit [email or web address].   Also download the MyChart app! Go to the app store, search "MyChart", open the app, select [place], and log in with your MyChart username and password.

## 2023-09-20 NOTE — Progress Notes (Signed)
 Patient presents today fro D1C1 Taxol /Carboplatin /Opdivo /Yervoy  per providers order.  Vital signs and labs within parameters for treatment.  Ipilimumab  (YERVOY ) Patient Monitoring Assessment   Is the patient experiencing any of the following general symptoms?:  [N]Difficulty performing normal activities [N]Feeling sluggish or cold all the time [N]Unusual weight gain [N]Constant or unusual headaches [N]Feeling dizzy or faint [N]Changes in eyesight (blurry vision, double vision, or other vision problems) [N]Changes in mood or behavior (ex: decreased sex drive, irritability, or forgetfulness) [N]Starting new medications (ex: steroids, other medications that lower immune response) [N]Patient is not experiencing any of the general symptoms above.   Gastrointestinal  Patient is having 1-2 bowel movements each day.  Is this different from baseline? [ ] Yes [X] No Are your stools watery or do they have a foul smell? [ ] Yes [X] No Have you seen blood in your stools? [ ] Yes [X] No Are your stools dark, tarry, or sticky? [ ] Yes [X] No Are you having pain or tenderness in your belly? [ ] Yes [X] No  Skin Does your skin itch? [ ] Yes [X] No Do you have a rash? [ ] Yes [X] No Has your skin blistered and/or peeled? [ ] Yes [X] No Do you have sores in your mouth? [ ] Yes [X] No  Hepatic Has your urine been dark or tea colored? [ ] Yes [X] No Have you noticed that your skin or the whites of your eyes are turning yellow? [ ] Yes [X] No Are you bleeding or bruising more easily than normal? [ ] Yes [X] No Are you nauseous and/or vomiting? [ ] Yes [X] No Do you have pain on the right side of your stomach? [ ] Yes [X] No  Neurologic  Are you having unusual weakness of legs, arms, or face? [ ] Yes [X] No Are you having numbness or tingling in your hands or feet? [ ] Yes [X] No  Heather [name], Heather [name]

## 2023-09-20 NOTE — Progress Notes (Signed)
 Pharmacist Chemotherapy Monitoring - Initial Assessment    Anticipated start date: 09/20/23   The following has been reviewed per standard work regarding the patient's treatment regimen: The patient's diagnosis, treatment plan and drug doses, and organ/hematologic function Lab orders and baseline tests specific to treatment regimen  The treatment plan start date, drug sequencing, and pre-medications Prior authorization status  Patient's documented medication list, including drug-drug interaction screen and prescriptions for anti-emetics and supportive care specific to the treatment regimen The drug concentrations, fluid compatibility, administration routes, and timing of the medications to be used The patient's access for treatment and lifetime cumulative dose history, if applicable  The patient's medication allergies and previous infusion related reactions, if applicable   Changes made to treatment plan:  New creatinine - adjust dose of carboplatin  AUC with capped creatinine.  Follow up needed:  N/A   Niels FORBES Molt, [facility], 09/20/2023  8:20 AM

## 2023-09-20 NOTE — Progress Notes (Signed)
Patient tolerated chemotherapy with no complaints voiced. Side effects with management reviewed understanding verbalized. Port site clean and dry with no bruising or swelling noted at site. Good blood return noted before and after administration of chemotherapy. Band aid applied. Patient left in satisfactory condition with VSS and no s/s of distress noted. 

## 2023-09-21 ENCOUNTER — Inpatient Hospital Stay

## 2023-09-21 VITALS — BP 157/63 | HR 69 | Temp 98.0°F | Resp 19

## 2023-09-21 DIAGNOSIS — C3491 Malignant neoplasm of unspecified part of right bronchus or lung: Secondary | ICD-10-CM

## 2023-09-21 DIAGNOSIS — L271 Localized skin eruption due to drugs and medicaments taken internally: Secondary | ICD-10-CM | POA: Diagnosis not present

## 2023-09-21 DIAGNOSIS — Z79899 Other long term (current) drug therapy: Secondary | ICD-10-CM | POA: Diagnosis not present

## 2023-09-21 DIAGNOSIS — M898X9 Other specified disorders of bone, unspecified site: Secondary | ICD-10-CM | POA: Diagnosis not present

## 2023-09-21 DIAGNOSIS — Z7952 Long term (current) use of systemic steroids: Secondary | ICD-10-CM | POA: Diagnosis not present

## 2023-09-21 DIAGNOSIS — N3000 Acute cystitis without hematuria: Secondary | ICD-10-CM | POA: Diagnosis not present

## 2023-09-21 DIAGNOSIS — Z5112 Encounter for antineoplastic immunotherapy: Secondary | ICD-10-CM | POA: Diagnosis not present

## 2023-09-21 DIAGNOSIS — Z5189 Encounter for other specified aftercare: Secondary | ICD-10-CM | POA: Diagnosis not present

## 2023-09-21 DIAGNOSIS — R197 Diarrhea, unspecified: Secondary | ICD-10-CM | POA: Diagnosis not present

## 2023-09-21 DIAGNOSIS — Z5111 Encounter for antineoplastic chemotherapy: Secondary | ICD-10-CM | POA: Diagnosis not present

## 2023-09-21 DIAGNOSIS — M2550 Pain in unspecified joint: Secondary | ICD-10-CM | POA: Diagnosis not present

## 2023-09-21 DIAGNOSIS — T451X5A Adverse effect of antineoplastic and immunosuppressive drugs, initial encounter: Secondary | ICD-10-CM | POA: Diagnosis not present

## 2023-09-21 DIAGNOSIS — Z79620 Long term (current) use of immunosuppressive biologic: Secondary | ICD-10-CM | POA: Diagnosis not present

## 2023-09-21 MED ORDER — PEGFILGRASTIM-JMDB 6 MG/0.6ML ~~LOC~~ SOSY
6.0000 mg | PREFILLED_SYRINGE | Freq: Once | SUBCUTANEOUS | Status: AC
  Administered 2023-09-21: 6 mg via SUBCUTANEOUS

## 2023-09-21 NOTE — Progress Notes (Signed)
Patient tolerated Fulphila injection with no complaints voiced.  Site clean and dry with no bruising or swelling noted.  No complaints of pain.  Discharged with vital signs stable and no signs or symptoms of distress noted.  

## 2023-09-21 NOTE — Patient Instructions (Signed)
 [facility] [place] - A DEPT OF [facility]  Discharge Instructions: Thank you for choosing [facility] to provide your oncology and hematology care.  If you have a lab appointment with the Cancer Center - please note that after April 8th, 2024, all labs will be drawn in the cancer center.  You do not have to check in or register with the main entrance as you have in the past but will complete your check-in in the cancer center.  Wear comfortable clothing and clothing appropriate for easy access to any Portacath or PICC line.   We strive to give you quality time with your provider. You may need to reschedule your appointment if you arrive late (15 or more minutes).  Arriving late affects you and other patients whose appointments are after yours.  Also, if you miss three or more appointments without notifying the office, you may be dismissed from the clinic at the provider's discretion.      For prescription refill requests, have your pharmacy contact our office and allow 72 hours for refills to be completed.    Today you received the following chemotherapy and/or immunotherapy agents Fulphila. Pegfilgrastim Injection What is this medication? PEGFILGRASTIM (PEG fil gra stim) lowers the risk of infection in people who are receiving chemotherapy. It works by Systems analyst make more white blood cells, which protects your body from infection. It may also be used to help people who have been exposed to high doses of radiation. This medicine may be used for other purposes; ask your health care provider or pharmacist if you have questions. COMMON BRAND NAME(S): Cherly Hensen, Neulasta, Nyvepria, Stimufend, UDENYCA, UDENYCA ONBODY, Ziextenzo What should I tell my care team before I take this medication? They need to know if you have any of these conditions: Kidney disease Latex allergy Ongoing radiation therapy Sickle cell disease Skin reactions to acrylic  adhesives (On-Body Injector only) An unusual or allergic reaction to pegfilgrastim, filgrastim, other medications, foods, dyes, or preservatives Pregnant or trying to get pregnant Breast-feeding How should I use this medication? This medication is for injection under the skin. If you get this medication at home, you will be taught how to prepare and give the pre-filled syringe or how to use the On-body Injector. Refer to the patient Instructions for Use for detailed instructions. Use exactly as directed. Tell your care team immediately if you suspect that the On-body Injector may not have performed as intended or if you suspect the use of the On-body Injector resulted in a missed or partial dose. It is important that you put your used needles and syringes in a special sharps container. Do not put them in a trash can. If you do not have a sharps container, call your pharmacist or care team to get one. Talk to your care team about the use of this medication in children. While this medication may be prescribed for selected conditions, precautions do apply. Overdosage: If you think you have taken too much of this medicine contact a poison control center or emergency room at once. NOTE: This medicine is only for you. Do not share this medicine with others. What if I miss a dose? It is important not to miss your dose. Call your care team if you miss your dose. If you miss a dose due to an On-body Injector failure or leakage, a new dose should be administered as soon as possible using a single prefilled syringe for manual  use. What may interact with this medication? Interactions have not been studied. This list may not describe all possible interactions. Give your health care provider a list of all the medicines, herbs, non-prescription drugs, or dietary supplements you use. Also tell them if you smoke, drink alcohol, or use illegal drugs. Some items may interact with your medicine. What should I watch for  while using this medication? Your condition will be monitored carefully while you are receiving this medication. You may need blood work done while you are taking this medication. Talk to your care team about your risk of cancer. You may be more at risk for certain types of cancer if you take this medication. If you are going to need a MRI, CT scan, or other procedure, tell your care team that you are using this medication (On-Body Injector only). What side effects may I notice from receiving this medication? Side effects that you should report to your care team as soon as possible: Allergic reactions--skin rash, itching, hives, swelling of the face, lips, tongue, or throat Capillary leak syndrome--stomach or muscle pain, unusual weakness or fatigue, feeling faint or lightheaded, decrease in the amount of urine, swelling of the ankles, hands, or feet, trouble breathing High white blood cell level--fever, fatigue, trouble breathing, night sweats, change in vision, weight loss Inflammation of the aorta--fever, fatigue, back, chest, or stomach pain, severe headache Kidney injury (glomerulonephritis)--decrease in the amount of urine, red or dark brown urine, foamy or bubbly urine, swelling of the ankles, hands, or feet Shortness of breath or trouble breathing Spleen injury--pain in upper left stomach or shoulder Unusual bruising or bleeding Side effects that usually do not require medical attention (report to your care team if they continue or are bothersome): Bone pain Pain in the hands or feet This list may not describe all possible side effects. Call your doctor for medical advice about side effects. You may report side effects to FDA at 1-800-FDA-1088. Where should I keep my medication? Keep out of the reach of children. If you are using this medication at home, you will be instructed on how to store it. Throw away any unused medication after the expiration date on the label. NOTE: This sheet  is a summary. It may not cover all possible information. If you have questions about this medicine, talk to your doctor, pharmacist, or health care provider.  2024 Elsevier/Gold Standard (2020-12-03 00:00:00)      To help prevent nausea and vomiting after your treatment, we encourage you to take your nausea medication as directed.  BELOW ARE SYMPTOMS THAT SHOULD BE REPORTED IMMEDIATELY: *FEVER GREATER THAN 100.4 F (38 C) OR HIGHER *CHILLS OR SWEATING *NAUSEA AND VOMITING THAT IS NOT CONTROLLED WITH YOUR NAUSEA MEDICATION *UNUSUAL SHORTNESS OF BREATH *UNUSUAL BRUISING OR BLEEDING *URINARY PROBLEMS (pain or burning when urinating, or frequent urination) *BOWEL PROBLEMS (unusual diarrhea, constipation, pain near the anus) TENDERNESS IN MOUTH AND THROAT WITH OR WITHOUT PRESENCE OF ULCERS (sore throat, sores in mouth, or a toothache) UNUSUAL RASH, SWELLING OR PAIN  UNUSUAL VAGINAL DISCHARGE OR ITCHING   Items with * indicate a potential emergency and should be followed up as soon as possible or go to the Emergency Department if any problems should occur.  Please show the CHEMOTHERAPY ALERT CARD or IMMUNOTHERAPY ALERT CARD at check-in to the Emergency Department and triage nurse.  Should you have questions after your visit or need to cancel or reschedule your appointment, please contact [facility] [place] - A DEPT  OF [facility] [phone]  and follow the prompts.  Office hours are 8:00 a.m. to 4:30 p.m. Monday - Friday. Please note that voicemails left after 4:00 p.m. may not be returned until the following business day.  We are closed weekends and major holidays. You have access to a nurse at all times for urgent questions. Please call the main number to the clinic [phone] and follow the prompts.  For any non-urgent questions, you may also contact your provider using MyChart. We now offer e-Visits for anyone 64 and older to request care online for non-urgent  symptoms. For details visit [email or web address].   Also download the MyChart app! Go to the app store, search "MyChart", open the app, select [place], and log in with your MyChart username and password.

## 2023-09-21 NOTE — Progress Notes (Signed)
 24 HOUR CHEMOTHERAPY CALL BACK:  Patient reports slight flushing, but feels it is likely due to steroids.  No other side effects or symptoms at this time.  Patient advised to call the office with any further questions or concerns.

## 2023-09-24 ENCOUNTER — Inpatient Hospital Stay: Admitting: Dietician

## 2023-09-24 ENCOUNTER — Telehealth: Payer: Self-pay | Admitting: Dietician

## 2023-09-24 NOTE — Telephone Encounter (Signed)
 Nutrition Assessment   Reason for Assessment: Referral - New NSCLC   ASSESSMENT: 68 year old female with newly diagnosed stage IV NSCLC with contralateral lymphadenopathy. She is receiving chemoimmunotherapy with carboplatin /taxol  + opdivo  q42d. Patient is under the care of Dr. Davonna.  Past medical history includes GERD, recurrent oral herpes simplex infection, hypothyroidism, osteopenia, HLD, GAD, vit D deficiency, tobacco use  Spoke with patient via telephone for nutrition assessment. She complains of aching from shot which started on Saturday. Yesterday was awful - in so much pain she cried. Used heating pad without relief. Patient asking if she can take motrin . Tylenol  and Claritin are not working. MD made aware.   Patient denies change in appetite or intake. Eats 2 meals usually. Breakfast is typically a bowl of Cheerios with whole milk and banana. Occasionally will have an egg. Biggest meal is supper. Last night had pork chops, black eyed peas, corn and potatoes. Patient does not drink much water , maybe 2 bottles. Has 3 cups of black coffee. Has one Mt. Dew zero, but not everyday.   Patient reports change in bowel movements from daily baseline. Had a small movement on Friday and this morning. She denies nausea or vomiting.    Nutrition Focused Physical Exam: unable to complete (telephone visit)   Medications: lipitor, buspar , decadron , D3, prozac , synthroid , prilosec, zofran , compazine , valtrex , ambien    Labs: reviewed    Anthropometrics:   Height: 5'4 Weight: 175 lb  UBW: 170-175 lb BMI: 30.04   NUTRITION DIAGNOSIS: Food and nutrition related knowledge deficit related to cancer as evidenced by no prior need for associated nutrition information   INTERVENTION:  Educated on importance of adequate calorie and protein energy intake to maintain strength/weights during treatment Educated on sources of protein, recommend protein foods at every meal Encourage high protein  snacks or ONS in the afternoon  Discussed importance of hydration, pt will work to increase intake of water  Suggested daily stool softener  Pt may take motrin  per triage RN - MD aware and will discuss with pt at f/u Handouts + contact information mailed  MONITORING, EVALUATION, GOAL: Pt will tolerate adequate energy intake to    Next Visit: Monday October 13 via telephone

## 2023-09-25 ENCOUNTER — Encounter: Payer: Self-pay | Admitting: *Deleted

## 2023-09-25 NOTE — Progress Notes (Signed)
 Patient called reporting severe pain at injection site following Fulphila  injection on 09/21/2023.  Has been taking tylenol  and ibuprofen  for discomfort.  Dr. Davonna made aware and will make changes to plan going forward.

## 2023-09-29 ENCOUNTER — Other Ambulatory Visit: Payer: Self-pay

## 2023-09-29 ENCOUNTER — Emergency Department (HOSPITAL_COMMUNITY)
Admission: EM | Admit: 2023-09-29 | Discharge: 2023-09-30 | Disposition: A | Discharge status: Home / Self Care | Attending: Emergency Medicine | Admitting: Emergency Medicine

## 2023-09-29 ENCOUNTER — Encounter (HOSPITAL_COMMUNITY): Payer: Self-pay

## 2023-09-29 DIAGNOSIS — R21 Rash and other nonspecific skin eruption: Secondary | ICD-10-CM | POA: Diagnosis not present

## 2023-09-29 DIAGNOSIS — L282 Other prurigo: Secondary | ICD-10-CM

## 2023-09-29 DIAGNOSIS — Z7982 Long term (current) use of aspirin: Secondary | ICD-10-CM | POA: Insufficient documentation

## 2023-09-29 DIAGNOSIS — D72829 Elevated white blood cell count, unspecified: Secondary | ICD-10-CM | POA: Insufficient documentation

## 2023-09-29 DIAGNOSIS — L299 Pruritus, unspecified: Secondary | ICD-10-CM | POA: Diagnosis not present

## 2023-09-29 DIAGNOSIS — Z85118 Personal history of other malignant neoplasm of bronchus and lung: Secondary | ICD-10-CM | POA: Insufficient documentation

## 2023-09-29 LAB — CBC WITH DIFFERENTIAL/PLATELET
Abs Immature Granulocytes: 1.82 K/uL — ABNORMAL HIGH (ref 0.00–0.07)
Basophils Absolute: 0.1 K/uL (ref 0.0–0.1)
Basophils Relative: 0 %
Eosinophils Absolute: 0.3 K/uL (ref 0.0–0.5)
Eosinophils Relative: 2 %
HCT: 35 % — ABNORMAL LOW (ref 36.0–46.0)
Hemoglobin: 11.8 g/dL — ABNORMAL LOW (ref 12.0–15.0)
Immature Granulocytes: 8 %
Lymphocytes Relative: 12 %
Lymphs Abs: 2.7 K/uL (ref 0.7–4.0)
MCH: 30.8 pg (ref 26.0–34.0)
MCHC: 33.7 g/dL (ref 30.0–36.0)
MCV: 91.4 fL (ref 80.0–100.0)
Monocytes Absolute: 1.4 K/uL — ABNORMAL HIGH (ref 0.1–1.0)
Monocytes Relative: 6 %
Neutro Abs: 16.3 K/uL — ABNORMAL HIGH (ref 1.7–7.7)
Neutrophils Relative %: 72 %
Platelets: 271 K/uL (ref 150–400)
RBC: 3.83 MIL/uL — ABNORMAL LOW (ref 3.87–5.11)
RDW: 13 % (ref 11.5–15.5)
WBC: 21.9 K/uL — ABNORMAL HIGH (ref 4.0–10.5)
nRBC: 0.1 % (ref 0.0–0.2)

## 2023-09-29 LAB — COMPREHENSIVE METABOLIC PANEL WITH GFR
ALT: 18 U/L (ref 0–44)
AST: 20 U/L (ref 15–41)
Albumin: 3.7 g/dL (ref 3.5–5.0)
Alkaline Phosphatase: 108 U/L (ref 38–126)
Anion gap: 11 (ref 5–15)
BUN: 11 mg/dL (ref 8–23)
CO2: 24 mmol/L (ref 22–32)
Calcium: 8.7 mg/dL — ABNORMAL LOW (ref 8.9–10.3)
Chloride: 100 mmol/L (ref 98–111)
Creatinine, Ser: 0.72 mg/dL (ref 0.44–1.00)
GFR, Estimated: >60 mL/min (ref >60)
Glucose, Bld: 106 mg/dL — ABNORMAL HIGH (ref 70–99)
Potassium: 3.6 mmol/L (ref 3.5–5.1)
Sodium: 135 mmol/L (ref 135–145)
Total Bilirubin: 0.5 mg/dL (ref 0.0–1.2)
Total Protein: 6.7 g/dL (ref 6.5–8.1)

## 2023-09-29 LAB — MAGNESIUM: Magnesium: 1.6 mg/dL — ABNORMAL LOW (ref 1.7–2.4)

## 2023-09-29 MED ORDER — MAGNESIUM SULFATE 2 GM/50ML IV SOLN
2.0000 g | Freq: Once | INTRAVENOUS | Status: AC
  Administered 2023-09-30: 2 g via INTRAVENOUS
  Filled 2023-09-29: qty 50

## 2023-09-29 MED ORDER — METHYLPREDNISOLONE SODIUM SUCC 125 MG IJ SOLR
125.0000 mg | Freq: Once | INTRAMUSCULAR | Status: AC
  Administered 2023-09-29: 125 mg via INTRAVENOUS
  Filled 2023-09-29: qty 2

## 2023-09-29 MED ORDER — DIPHENHYDRAMINE HCL 50 MG/ML IJ SOLN
25.0000 mg | Freq: Once | INTRAMUSCULAR | Status: AC
  Administered 2023-09-29: 25 mg via INTRAVENOUS
  Filled 2023-09-29: qty 1

## 2023-09-29 MED ORDER — LACTATED RINGERS IV BOLUS
1000.0000 mL | Freq: Once | INTRAVENOUS | Status: AC
  Administered 2023-09-30: 1000 mL via INTRAVENOUS

## 2023-09-29 MED ORDER — FAMOTIDINE IN NACL 20-0.9 MG/50ML-% IV SOLN
20.0000 mg | Freq: Once | INTRAVENOUS | Status: AC
  Administered 2023-09-29: 20 mg via INTRAVENOUS
  Filled 2023-09-29: qty 50

## 2023-09-29 NOTE — ED Triage Notes (Signed)
 Pt stated that she had her first round of chemo on 9/4 and is now having a reaction. Pt has rash on face and arms and is very itchy

## 2023-09-29 NOTE — ED Notes (Signed)
 Pt ambulated to restroom with steady gait.

## 2023-09-29 NOTE — ED Provider Notes (Signed)
 [place] EMERGENCY DEPARTMENT AT [facility] Provider Note   CSN: [id number] Arrival date & time: 09/29/23  2228     Patient presents with: Allergic Reaction   Heather [name] is a 68 y.o. female.  {Add pertinent medical, surgical, social history, OB history to YEP:67052}  Allergic Reaction Presenting symptoms: rash   Patient presents for pruritic rash.  Medical history includes HLD, GERD, anxiety, depression, fibromyalgia, lung cancer.  Lung cancer is recent diagnosis.  She initiated chemotherapy 1 week ago.  She also underwent shot of pegfilgrastim .  She denies any other new medications.  Yesterday, she developed a pruritic spot on the underside of her right mandible.  Today, rashes spread to areas of upper arms, face, as well as skin overlying her port.  She describes both a pruritus as well as a burning discomfort.  She denies any other associated symptoms.    Prior to Admission medications   Medication Sig Start Date End Date Taking? Authorizing Provider  Ascorbic Acid (VITAMIN C) 100 MG tablet Take 100 mg by mouth daily.    [provider]  aspirin  EC 81 MG tablet Take 1 tablet (81 mg total) by mouth daily. Swallow whole. 08/18/22   Lonni Slain, MD  atorvastatin  (LIPITOR) 40 MG tablet Take 1 tablet (40 mg total) by mouth daily. 06/15/23   [name], DO  busPIRone  (BUSPAR ) 30 MG tablet Take 1/2 (one-half) tablet by mouth twice daily 06/15/23   Jolinda Norene M, DO  Cholecalciferol (VITAMIN D -3) 25 MCG (1000 UT) CAPS Take by mouth.    [provider]  dexamethasone  (DECADRON ) 4 MG tablet Take 2 tablets (8mg ) by mouth daily starting the day after carboplatin  for 3 days. Take with food 09/13/23   Kandala, Hyndavi, MD  diclofenac  Sodium (VOLTAREN ) 1 % GEL Apply 1 Application topically as needed (pain). 08/13/23   Shelah Lamar RAMAN, MD  FLUoxetine  (PROZAC ) 40 MG capsule Take 1 capsule (40 mg total) by mouth daily. 06/15/23   [name],  DO  levothyroxine  (SYNTHROID ) 112 MCG tablet Take 1 tablet (112 mcg total) by mouth daily before breakfast. 06/15/23   Jolinda Norene M, DO  lidocaine -prilocaine  (EMLA ) cream Apply to affected area once 09/13/23   Kandala, Hyndavi, MD  omeprazole  (PRILOSEC) 40 MG capsule Take 1 capsule (40 mg total) by mouth daily. 06/15/23   [name], DO  ondansetron  (ZOFRAN ) 8 MG tablet Take 1 tablet (8 mg total) by mouth every 8 (eight) hours as needed for nausea or vomiting. Start on the third day after carboplatin . 09/13/23   Davonna Siad, MD  prochlorperazine  (COMPAZINE ) 10 MG tablet Take 1 tablet (10 mg total) by mouth every 6 (six) hours as needed for nausea or vomiting. 09/13/23   Kandala, Hyndavi, MD  triamcinolone  cream (KENALOG ) 0.1 % Apply 1 Application topically 2 (two) times daily. X7-10 days to patch on left side of face. 06/15/23   [name], DO  valACYclovir  (VALTREX ) 1000 MG tablet TAKE 2 TABLETS BY MOUTH TWICE DAILY FOR 1 DAY AT FEVER BLISTER ONSET 06/15/23   Jolinda Norene M, DO  zolpidem  (AMBIEN ) 5 MG tablet Take 1 tablet (5 mg total) by mouth at bedtime as needed for sleep. 07/26/23   Kandala, Hyndavi, MD    Allergies: Patient has no known allergies.    Review of Systems  Skin:  Positive for rash.  All other systems reviewed and are negative.   Updated Vital Signs BP (!) 173/80 (BP Location: Right Arm)  Pulse 86   Temp 98.4 F (36.9 C) (Oral)   Resp 20   Ht 5' 4 (1.626 m)   Wt 77.6 kg   SpO2 96%   BMI 29.35 kg/m   Physical Exam Vitals and nursing note reviewed.  Constitutional:      General: She is not in acute distress.    Appearance: Normal appearance. She is well-developed. She is not ill-appearing, toxic-appearing or diaphoretic.  HENT:     Head: Normocephalic and atraumatic.     Right Ear: External ear normal.     Left Ear: External ear normal.     Nose: Nose normal.     Mouth/Throat:     Mouth: Mucous membranes are moist.  Eyes:      Extraocular Movements: Extraocular movements intact.     Conjunctiva/sclera: Conjunctivae normal.  Cardiovascular:     Rate and Rhythm: Normal rate and regular rhythm.  Pulmonary:     Effort: Pulmonary effort is normal. No respiratory distress.  Abdominal:     General: There is no distension.     Palpations: Abdomen is soft.  Musculoskeletal:        General: No swelling. Normal range of motion.     Cervical back: Normal range of motion and neck supple.  Skin:    General: Skin is warm and dry.     Capillary Refill: Capillary refill takes less than 2 seconds.     Findings: Rash (Patchy erythematous rash on face, upper arms, and chest..) present.  Neurological:     General: No focal deficit present.     Mental Status: She is alert and oriented to person, place, and time.  Psychiatric:        Mood and Affect: Mood normal.        Behavior: Behavior normal.     (all labs ordered are listed, but only abnormal results are displayed) Labs Reviewed - No data to display  EKG: None  Radiology: No results found.  {Document cardiac monitor, telemetry assessment procedure when appropriate:32947} Procedures   Medications Ordered in the ED  diphenhydrAMINE  (BENADRYL ) injection 25 mg (has no administration in time range)  methylPREDNISolone  sodium succinate (SOLU-MEDROL ) 125 mg/2 mL injection 125 mg (has no administration in time range)  famotidine  (PEPCID ) IVPB 20 mg premix (has no administration in time range)      {Click here for ABCD2, HEART and other calculators REFRESH Note before signing:1}                              Medical Decision Making Amount and/or Complexity of Data Reviewed Labs: ordered.  Risk Prescription drug management.   This patient presents to the ED for concern of ***, this involves an extensive number of treatment options, and is a complaint that carries with it a high risk of complications and morbidity.  The differential diagnosis includes ***   Co  morbidities / Chronic conditions that complicate the patient evaluation  ***   Additional history obtained:  Additional history obtained from EMR External records from outside source obtained and reviewed including ***   Lab Tests:  I Ordered, and personally interpreted labs.  The pertinent results include:  ***   Imaging Studies ordered:  I ordered imaging studies including ***  I independently visualized and interpreted imaging which showed *** I agree with the radiologist interpretation   Cardiac Monitoring: / EKG:  The patient was maintained on a cardiac monitor.  I  personally viewed and interpreted the cardiac monitored which showed an underlying rhythm of: ***   Problem List / ED Course / Critical interventions / Medication management  Patient presenting for pruritic rash, first noticed yesterday but worsened today.  Vital signs on arrival notable for moderate hypertension.  Patient is overall well-appearing on exam.  She has patchy erythematous areas which she describes as mostly itchy.  Some areas on her face having associated burning discomfort.  Patient was treated in the ED with Solu-Medrol , Benadryl , Pepcid .  Lab work shows a leukocytosis, which is expected following pegfilgrastim  injection.  Her magnesium  is low.  Replacement was ordered.  Lab work is otherwise unremarkable.  On reassessment,*** I ordered medication including ***   Reevaluation of the patient after these medicines showed that the patient *** I have reviewed the patients home medicines and have made adjustments as needed   Consultations Obtained:  I requested consultation with the ***,  and discussed lab and imaging findings as well as pertinent plan - they recommend: ***   Social Determinants of Health:  ***   Test / Admission - Considered:  ***   {Document critical care time when appropriate  Document review of labs and clinical decision tools ie CHADS2VASC2, etc  Document your  independent review of radiology images and any outside records  Document your discussion with family members, caretakers and with consultants  Document social determinants of health affecting pt's care  Document your decision making why or why not admission, treatments were needed:32947:::1}   Final diagnoses:  None    ED Discharge Orders     None

## 2023-09-30 MED ORDER — DIPHENHYDRAMINE HCL 25 MG PO TABS: 25.0000 mg | ORAL_TABLET | Freq: Four times a day (QID) | ORAL | 0 refills | Status: AC

## 2023-09-30 MED ORDER — PREDNISONE 10 MG PO TABS: 40.0000 mg | ORAL_TABLET | Freq: Every day | ORAL | 0 refills | Status: AC

## 2023-09-30 MED ORDER — FAMOTIDINE 20 MG PO TABS: 20.0000 mg | ORAL_TABLET | Freq: Two times a day (BID) | ORAL | 0 refills | Status: AC

## 2023-09-30 NOTE — Discharge Instructions (Addendum)
 Prescriptions for medications were sent to your pharmacy.  Take as prescribed for the next 3 days.  Follow-up with your oncologist.  Ensure that they are aware of your recent rash.  Return to the emergency department for any new or worsening symptoms of concern.

## 2023-10-02 ENCOUNTER — Inpatient Hospital Stay: Admitting: Oncology

## 2023-10-02 VITALS — BP 176/74 | HR 68 | Resp 20 | Wt 176.1 lb

## 2023-10-02 DIAGNOSIS — T451X5A Adverse effect of antineoplastic and immunosuppressive drugs, initial encounter: Secondary | ICD-10-CM | POA: Diagnosis not present

## 2023-10-02 DIAGNOSIS — Z79620 Long term (current) use of immunosuppressive biologic: Secondary | ICD-10-CM | POA: Diagnosis not present

## 2023-10-02 DIAGNOSIS — M2550 Pain in unspecified joint: Secondary | ICD-10-CM | POA: Insufficient documentation

## 2023-10-02 DIAGNOSIS — M898X9 Other specified disorders of bone, unspecified site: Secondary | ICD-10-CM | POA: Diagnosis not present

## 2023-10-02 DIAGNOSIS — R21 Rash and other nonspecific skin eruption: Secondary | ICD-10-CM | POA: Diagnosis not present

## 2023-10-02 DIAGNOSIS — L271 Localized skin eruption due to drugs and medicaments taken internally: Secondary | ICD-10-CM | POA: Diagnosis not present

## 2023-10-02 DIAGNOSIS — Z5111 Encounter for antineoplastic chemotherapy: Secondary | ICD-10-CM | POA: Diagnosis not present

## 2023-10-02 DIAGNOSIS — Z5189 Encounter for other specified aftercare: Secondary | ICD-10-CM | POA: Diagnosis not present

## 2023-10-02 DIAGNOSIS — C3491 Malignant neoplasm of unspecified part of right bronchus or lung: Secondary | ICD-10-CM

## 2023-10-02 DIAGNOSIS — L309 Dermatitis, unspecified: Secondary | ICD-10-CM

## 2023-10-02 DIAGNOSIS — Z7952 Long term (current) use of systemic steroids: Secondary | ICD-10-CM | POA: Diagnosis not present

## 2023-10-02 DIAGNOSIS — R197 Diarrhea, unspecified: Secondary | ICD-10-CM | POA: Diagnosis not present

## 2023-10-02 DIAGNOSIS — Z5112 Encounter for antineoplastic immunotherapy: Secondary | ICD-10-CM | POA: Diagnosis not present

## 2023-10-02 DIAGNOSIS — Z79899 Other long term (current) drug therapy: Secondary | ICD-10-CM | POA: Diagnosis not present

## 2023-10-02 DIAGNOSIS — N3000 Acute cystitis without hematuria: Secondary | ICD-10-CM | POA: Diagnosis not present

## 2023-10-02 MED ORDER — TRIAMCINOLONE ACETONIDE 0.1 % EX CREA: 1.0000 | TOPICAL_CREAM | Freq: Two times a day (BID) | CUTANEOUS | 0 refills | Status: DC

## 2023-10-02 NOTE — Assessment & Plan Note (Deleted)
 Low magnesium  levels while in the emergency room. She was given IV mag. Discussed trying magnesium  OTC although if it causes diarrhea, she was instructed to stop.

## 2023-10-02 NOTE — Assessment & Plan Note (Deleted)
-  Tolerated cycle 1 fair.  -Developed a rash, diarrhea and severe joint pain secondary to fulphila .  -Was seen in the ED for rash and was given prednisone  and took her last dose today. - Labs from ED are what we anticipated given recent treatment and myelosuppression. -She is scheduled to return to clinic next week for additional lab work and cycle 2.

## 2023-10-02 NOTE — Progress Notes (Signed)
 [facility] OFFICE PROGRESS NOTE  Heather Norene HERO, DO  ASSESSMENT & PLAN:    Assessment & Plan Non-small cell lung cancer, right (HCC) Status post 1 cycle of chemotherapy. Plan is for 4 cycles and then repeat imaging. She will be due for her second cycle on 10/10/23.  She will follow-up with Dr. Davonna prior. Dermatitis of face Continue prednisone .  She may use as needed for itching. Etiology secondary to chemo. Improving. Arthralgia, unspecified joint Secondary to growth factor. Discussed with Dr. Davonna who mentioned she may not need this in the future should her counts be stable. Diarrhea, unspecified type Secondary to chemo.  Lasted 3 days. Having normal bowel movements now. Hypomagnesemia This was replaced in the emergency room. We discussed trying OTC magnesium  but if it causes diarrhea, recommend stopping. Rash and nonspecific skin eruption Etiology secondary to chemo. She was evaluated in the emergency room and given prednisone  x 3 days.  She will complete prednisone  today. Rash is less itchy although it still visible. Recommend Benadryl  at bedtime. Will send prescription for Kenalog  cream twice daily to affected areas.   No orders of the defined types were placed in this encounter.   INTERVAL HISTORY: Patient returns for follow-up after recent chemotherapy.  Reports tolerating treatment fair.  Developed joint pain after she received growth factor support.  Reports she started taking Tylenol  and Claritin the next day which did not help.  She also added Motrin  and a heating blanket which seemed to help.  She had 3 days worth of diarrhea following her chemo but that is since resolved.  She was seen in the ED for a rash on her upper extremities, chest and face.  She was given prednisone  which she will complete today and has been taking Benadryl  at bedtime.  This has helped tremendously with the itching.  We reviewed most recent labs.  SUMMARY OF  HEMATOLOGIC HISTORY: Oncology History  Non-small cell lung cancer, right (HCC)  06/26/2023 Imaging   CT chest low dose without contrast:  IMPRESSION: 1. Lung-RADS 4B, suspicious. Additional imaging evaluation or consultation with Pulmonology or Thoracic Surgery recommended. Irregular spiculated partially cavitary solid 3.6 cm peripheral apical right upper lobe lung mass, highly suspicious for primary bronchogenic carcinoma. At least 7 additional solid pulmonary nodules scattered in the right greater than left lungs, largest 8.6 mm in the posterior right lower lobe, and including a cavitary 8 mm left lower lobe nodule, suspicious for bilateral pulmonary metastases. Mildly enlarged right paratracheal lymph node, suspicious for nodal metastasis.    07/19/2023 PET scan   IMPRESSION: Dominant right apical cavitary lesion is diffusely hypermetabolic consistent with area of malignancy. There are several metastatic nodes identified involving the right hilum, thoracic inlet, supraclavicular region as well as along the mediastinum, right greater than left. There are a few other small lung nodules bilaterally which have low-level uptake. These have a differential and could represent other areas of neoplastic disease versus infectious/inflammatory.  No specific abnormal uptake below the diaphragm    08/07/2023 Imaging   MRI brain W WO contrast:   IMPRESSION: There are several small T2 hyperintensities within the cerebral white matter. These abnormalities are nonspecific and of uncertain etiology or clinical significance. They are more common in older patients and patients with risk factors for vascular disease.   No evidence of metastatic disease.   08/08/2023 Imaging   CT chest super D without contrast:  1. Spiculated, partially cavitary and FDG avid mass of the peripheral right  apex measures 3.8 x 2.4 cm, consistent with primary bronchogenic malignancy. 2. Multiple bilateral pulmonary nodules are  unchanged, including a partially cavitary subpleural nodule of the dependent superior segment left lower lobe. 3. Redemonstrated enlarged mediastinal and right hilar lymph nodes, previously FDG avid, consistent with nodal metastatic disease.   08/13/2023 Pathology Results   FINAL MICROSCOPIC DIAGNOSIS:  A. LUNG, RUL, NEEDLE ASPIRATION:  - Non-small cell carcinoma, favor squamous cell carcinoma.   B. LYMPH NODE, 4R, FINE NEEDLE ASPIRATION:  - Positive for malignancy.  - Metastatic non-small cell carcinoma is present.   C. LYMPH NODE, 7, FINE NEEDLE ASPIRATION:  - Positive for malignancy.  - Metastatic non-small cell carcinoma is present.     08/23/2023 Cancer Staging   Staging form: Lung, AJCC V9 - Pathologic stage from 08/23/2023: Stage IVA (pT2, pN3, cM1a) - Signed by Davonna Siad, MD on 08/23/2023 Stage prefix: Initial diagnosis   09/05/2023 Procedure   IR guided port placement   09/07/2023 Pathology Results   [facility]:   ALK, BRAF, EGFR, KRAS, MET, RET, ROS1: Negative  RB1, TP53: Positive, MSI-Stable, PDL1: Negative, 0%, TMB: low   09/20/2023 -  Chemotherapy   Patient is on Treatment Plan : LUNG NSCLC SQUAMOUS Nivolumab  + Ipilimumab  + Carboplatin  + Paclitaxel  q42d X 1 cycle / Nivolumab  + Ipilimumab  q42d        CBC    Component Value Date/Time   WBC 21.9 (H) 09/29/2023 2319   RBC 3.83 (L) 09/29/2023 2319   HGB 11.8 (L) 09/29/2023 2319   HGB 13.1 06/15/2023 0934   HCT 35.0 (L) 09/29/2023 2319   HCT 40.4 06/15/2023 0934   PLT 271 09/29/2023 2319   PLT 323 06/15/2023 0934   MCV 91.4 09/29/2023 2319   MCV 92 06/15/2023 0934   MCH 30.8 09/29/2023 2319   MCHC 33.7 09/29/2023 2319   RDW 13.0 09/29/2023 2319   RDW 12.1 06/15/2023 0934   LYMPHSABS 2.7 09/29/2023 2319   LYMPHSABS 1.1 04/10/2017 1035   MONOABS 1.4 (H) 09/29/2023 2319   EOSABS 0.3 09/29/2023 2319   EOSABS 0.2 04/10/2017 1035   BASOSABS 0.1 09/29/2023 2319   BASOSABS 0.0 04/10/2017 1035       Latest Ref Rng  & Units 09/29/2023   11:19 PM 09/18/2023    9:45 AM 06/15/2023    9:34 AM  CMP  Glucose 70 - 99 mg/dL 893  89  84   BUN 8 - 23 mg/dL 11  15  11    Creatinine 0.44 - 1.00 mg/dL 9.27  9.34  9.37   Sodium 135 - 145 mmol/L 135  136  138   Potassium 3.5 - 5.1 mmol/L 3.6  4.2  5.4   Chloride 98 - 111 mmol/L 100  103  102   CO2 22 - 32 mmol/L 24  24  23    Calcium  8.9 - 10.3 mg/dL 8.7  9.3  89.7   Total Protein 6.5 - 8.1 g/dL 6.7  7.0  6.9   Total Bilirubin 0.0 - 1.2 mg/dL 0.5  0.8  0.4   Alkaline Phos 38 - 126 U/L 108  73  94   AST 15 - 41 U/L 20  17  19    ALT 0 - 44 U/L 18  16  13       No results found for: FERRITIN, VITAMINB12  Vitals:   10/02/23 1109 10/02/23 1114  BP: (!) 195/85 (!) 176/74  Pulse: 68   Resp: 20   SpO2: 99%  Review of System:  Review of Systems  Gastrointestinal:  Positive for diarrhea.  Musculoskeletal:  Positive for joint pain.  Skin:  Positive for itching and rash.  Psychiatric/Behavioral:  The patient has insomnia.     Physical Exam: Physical Exam Skin:    Findings: Rash present. Rash is macular and papular.     Comments: To chest, upper extremities and face.      I spent 25 minutes dedicated to the care of this patient (face-to-face and non-face-to-face) on the date of the encounter to include what is described in the assessment and plan.,  Delon Hope, NP 10/02/2023 3:36 PM

## 2023-10-02 NOTE — Assessment & Plan Note (Signed)
 This was replaced in the emergency room. We discussed trying OTC magnesium  but if it causes diarrhea, recommend stopping.

## 2023-10-02 NOTE — Assessment & Plan Note (Signed)
 Secondary to chemo.  Lasted 3 days. Having normal bowel movements now.

## 2023-10-02 NOTE — Assessment & Plan Note (Deleted)
 Arthralgias mainly in knees ankles and feet. Etiology thought to be secondary to growth factor. Reports she did take Claritin and Tylenol  beginning the day after her injection. Given severity of her joint pain, Dr. Davonna recommends potentially skipping it with her next chemo as long as her counts are okay prior to second cycle.

## 2023-10-02 NOTE — Assessment & Plan Note (Addendum)
 Etiology secondary to chemo. She was evaluated in the emergency room and given prednisone  x 3 days.  She will complete prednisone  today. Rash is less itchy although it still visible. Recommend Benadryl  at bedtime. Will send prescription for Kenalog  cream twice daily to affected areas.

## 2023-10-02 NOTE — Assessment & Plan Note (Addendum)
 Status post 1 cycle of chemotherapy. Plan is for 4 cycles and then repeat imaging. She will be due for her second cycle on 10/10/23.  She will follow-up with Dr. Davonna prior.

## 2023-10-02 NOTE — Assessment & Plan Note (Deleted)
 Secondary to chemo. Reports diarrhea for 3 days following chemo. She is now having normal bowel movements. Continue to monitor.

## 2023-10-02 NOTE — Assessment & Plan Note (Signed)
 Secondary to growth factor. Discussed with Dr. Davonna who mentioned she may not need this in the future should her counts be stable.

## 2023-10-02 NOTE — Addendum Note (Signed)
 Addended by: Abcde Oneil on: 10/02/2023 06:44 PM   Modules accepted: Level of Service

## 2023-10-09 ENCOUNTER — Other Ambulatory Visit: Payer: Self-pay | Admitting: *Deleted

## 2023-10-09 ENCOUNTER — Inpatient Hospital Stay

## 2023-10-09 ENCOUNTER — Telehealth: Payer: Self-pay | Admitting: *Deleted

## 2023-10-09 VITALS — BP 139/70 | HR 82 | Temp 97.8°F | Resp 18

## 2023-10-09 DIAGNOSIS — R350 Frequency of micturition: Secondary | ICD-10-CM

## 2023-10-09 DIAGNOSIS — Z5111 Encounter for antineoplastic chemotherapy: Secondary | ICD-10-CM | POA: Diagnosis not present

## 2023-10-09 DIAGNOSIS — C3491 Malignant neoplasm of unspecified part of right bronchus or lung: Secondary | ICD-10-CM

## 2023-10-09 LAB — CBC WITH DIFFERENTIAL/PLATELET
Abs Immature Granulocytes: 0.03 K/uL (ref 0.00–0.07)
Basophils Absolute: 0.1 K/uL (ref 0.0–0.1)
Basophils Relative: 1 %
Eosinophils Absolute: 0.2 K/uL (ref 0.0–0.5)
Eosinophils Relative: 2 %
HCT: 35 % — ABNORMAL LOW (ref 36.0–46.0)
Hemoglobin: 11.7 g/dL — ABNORMAL LOW (ref 12.0–15.0)
Immature Granulocytes: 0 %
Lymphocytes Relative: 18 %
Lymphs Abs: 1.5 K/uL (ref 0.7–4.0)
MCH: 30.5 pg (ref 26.0–34.0)
MCHC: 33.4 g/dL (ref 30.0–36.0)
MCV: 91.4 fL (ref 80.0–100.0)
Monocytes Absolute: 0.9 K/uL (ref 0.1–1.0)
Monocytes Relative: 11 %
Neutro Abs: 5.6 K/uL (ref 1.7–7.7)
Neutrophils Relative %: 68 %
Platelets: 434 K/uL — ABNORMAL HIGH (ref 150–400)
RBC: 3.83 MIL/uL — ABNORMAL LOW (ref 3.87–5.11)
RDW: 13.3 % (ref 11.5–15.5)
WBC: 8.4 K/uL (ref 4.0–10.5)
nRBC: 0 % (ref 0.0–0.2)

## 2023-10-09 LAB — COMPREHENSIVE METABOLIC PANEL WITH GFR
ALT: 16 U/L (ref 0–44)
AST: 14 U/L — ABNORMAL LOW (ref 15–41)
Albumin: 3.6 g/dL (ref 3.5–5.0)
Alkaline Phosphatase: 81 U/L (ref 38–126)
Anion gap: 12 (ref 5–15)
BUN: 15 mg/dL (ref 8–23)
CO2: 22 mmol/L (ref 22–32)
Calcium: 9.2 mg/dL (ref 8.9–10.3)
Chloride: 102 mmol/L (ref 98–111)
Creatinine, Ser: 0.6 mg/dL (ref 0.44–1.00)
GFR, Estimated: >60 mL/min (ref >60)
Glucose, Bld: 94 mg/dL (ref 70–99)
Potassium: 4.2 mmol/L (ref 3.5–5.1)
Sodium: 136 mmol/L (ref 135–145)
Total Bilirubin: 0.7 mg/dL (ref 0.0–1.2)
Total Protein: 7.5 g/dL (ref 6.5–8.1)

## 2023-10-09 LAB — URINALYSIS, ROUTINE W REFLEX MICROSCOPIC
Bilirubin Urine: NEGATIVE
Glucose, UA: NEGATIVE mg/dL
Hgb urine dipstick: NEGATIVE
Ketones, ur: NEGATIVE mg/dL
Nitrite: POSITIVE — AB
Protein, ur: NEGATIVE mg/dL
Specific Gravity, Urine: 1.018 (ref 1.005–1.030)
WBC, UA: >50 WBC/hpf (ref 0–5)
pH: 6 (ref 5.0–8.0)

## 2023-10-09 LAB — MAGNESIUM: Magnesium: 2.1 mg/dL (ref 1.7–2.4)

## 2023-10-09 MED ORDER — NITROFURANTOIN MONOHYD MACRO 100 MG PO CAPS: 100.0000 mg | ORAL_CAPSULE | Freq: Two times a day (BID) | ORAL | 0 refills | Status: AC

## 2023-10-09 NOTE — Progress Notes (Signed)
 Port flushed with good blood return noted. No bruising or swelling at site. Bandaid applied and patient discharged in satisfactory condition. VVS stable with no signs or symptoms of distressed noted.

## 2023-10-09 NOTE — Patient Instructions (Signed)
 [facility] - A DEPT OF . [facility]  Discharge Instructions: Thank you for choosing [facility] to provide your oncology and hematology care.  If you have a lab appointment with the Cancer Center - please note that after April 8th, 2024, all labs will be drawn in the cancer center.  You do not have to check in or register with the main entrance as you have in the past but will complete your check-in in the cancer center.  Wear comfortable clothing and clothing appropriate for easy access to any Portacath or PICC line.   We strive to give you quality time with your provider. You may need to reschedule your appointment if you arrive late (15 or more minutes).  Arriving late affects you and other patients whose appointments are after yours.  Also, if you miss three or more appointments without notifying the office, you may be dismissed from the clinic at the provider's discretion.      For prescription refill requests, have your pharmacy contact our office and allow 72 hours for refills to be completed.    Today you received the following port flush, with lab draw, return as scheduled.   To help prevent nausea and vomiting after your treatment, we encourage you to take your nausea medication as directed.  BELOW ARE SYMPTOMS THAT SHOULD BE REPORTED IMMEDIATELY: *FEVER GREATER THAN 100.4 F (38 C) OR HIGHER *CHILLS OR SWEATING *NAUSEA AND VOMITING THAT IS NOT CONTROLLED WITH YOUR NAUSEA MEDICATION *UNUSUAL SHORTNESS OF BREATH *UNUSUAL BRUISING OR BLEEDING *URINARY PROBLEMS (pain or burning when urinating, or frequent urination) *BOWEL PROBLEMS (unusual diarrhea, constipation, pain near the anus) TENDERNESS IN MOUTH AND THROAT WITH OR WITHOUT PRESENCE OF ULCERS (sore throat, sores in mouth, or a toothache) UNUSUAL RASH, SWELLING OR PAIN  UNUSUAL VAGINAL DISCHARGE OR ITCHING   Items with * indicate a potential emergency and should be followed up as  soon as possible or go to the Emergency Department if any problems should occur.  Please show the CHEMOTHERAPY ALERT CARD or IMMUNOTHERAPY ALERT CARD at check-in to the Emergency Department and triage nurse.  Should you have questions after your visit or need to cancel or reschedule your appointment, please contact [facility] [place] - A DEPT OF [facility] [phone]  and follow the prompts.  Office hours are 8:00 a.m. to 4:30 p.m. Monday - Friday. Please note that voicemails left after 4:00 p.m. may not be returned until the following business day.  We are closed weekends and major holidays. You have access to a nurse at all times for urgent questions. Please call the main number to the clinic [phone] and follow the prompts.  For any non-urgent questions, you may also contact your provider using MyChart. We now offer e-Visits for anyone 71 and older to request care online for non-urgent symptoms. For details visit [email or web address].   Also download the MyChart app! Go to the app store, search MyChart, open the app, select [place], and log in with your MyChart username and password.

## 2023-10-09 NOTE — Telephone Encounter (Signed)
 Patient expressed symptoms of frequent urination to nursing staff.  UA collected.  Per Dr. Davonna recommended macrobid  100 mg bid x 5 days.  Patient made aware and verbalized understanding.

## 2023-10-10 ENCOUNTER — Inpatient Hospital Stay (HOSPITAL_BASED_OUTPATIENT_CLINIC_OR_DEPARTMENT_OTHER): Admitting: Oncology

## 2023-10-10 ENCOUNTER — Inpatient Hospital Stay

## 2023-10-10 ENCOUNTER — Other Ambulatory Visit

## 2023-10-10 VITALS — BP 136/79 | HR 78 | Temp 98.0°F | Resp 18 | Wt 172.8 lb

## 2023-10-10 VITALS — BP 125/57 | HR 94 | Temp 98.5°F | Resp 18

## 2023-10-10 DIAGNOSIS — F419 Anxiety disorder, unspecified: Secondary | ICD-10-CM

## 2023-10-10 DIAGNOSIS — C3491 Malignant neoplasm of unspecified part of right bronchus or lung: Secondary | ICD-10-CM

## 2023-10-10 DIAGNOSIS — N390 Urinary tract infection, site not specified: Secondary | ICD-10-CM | POA: Insufficient documentation

## 2023-10-10 DIAGNOSIS — N3000 Acute cystitis without hematuria: Secondary | ICD-10-CM | POA: Diagnosis not present

## 2023-10-10 DIAGNOSIS — Z720 Tobacco use: Secondary | ICD-10-CM | POA: Diagnosis not present

## 2023-10-10 DIAGNOSIS — M898X9 Other specified disorders of bone, unspecified site: Secondary | ICD-10-CM | POA: Insufficient documentation

## 2023-10-10 DIAGNOSIS — L271 Localized skin eruption due to drugs and medicaments taken internally: Secondary | ICD-10-CM | POA: Diagnosis not present

## 2023-10-10 DIAGNOSIS — Z5111 Encounter for antineoplastic chemotherapy: Secondary | ICD-10-CM | POA: Diagnosis not present

## 2023-10-10 MED ORDER — SODIUM CHLORIDE 0.9 % IV SOLN
200.0000 mg/m2 | Freq: Once | INTRAVENOUS | Status: AC
  Administered 2023-10-10: 378 mg via INTRAVENOUS
  Filled 2023-10-10: qty 63

## 2023-10-10 MED ORDER — DEXAMETHASONE SODIUM PHOSPHATE 10 MG/ML IJ SOLN
10.0000 mg | Freq: Once | INTRAMUSCULAR | Status: AC
  Administered 2023-10-10: 10 mg via INTRAVENOUS
  Filled 2023-10-10: qty 1

## 2023-10-10 MED ORDER — APREPITANT 130 MG/18ML IV EMUL
130.0000 mg | Freq: Once | INTRAVENOUS | Status: AC
  Administered 2023-10-10: 130 mg via INTRAVENOUS
  Filled 2023-10-10: qty 18

## 2023-10-10 MED ORDER — DIPHENHYDRAMINE HCL 50 MG/ML IJ SOLN
50.0000 mg | Freq: Once | INTRAMUSCULAR | Status: AC
  Administered 2023-10-10: 50 mg via INTRAVENOUS
  Filled 2023-10-10: qty 1

## 2023-10-10 MED ORDER — SODIUM CHLORIDE 0.9 % IV SOLN
360.0000 mg | Freq: Once | INTRAVENOUS | Status: AC
  Administered 2023-10-10: 360 mg via INTRAVENOUS
  Filled 2023-10-10: qty 24

## 2023-10-10 MED ORDER — PALONOSETRON HCL INJECTION 0.25 MG/5ML
0.2500 mg | Freq: Once | INTRAVENOUS | Status: AC
  Administered 2023-10-10: 0.25 mg via INTRAVENOUS
  Filled 2023-10-10: qty 5

## 2023-10-10 MED ORDER — SODIUM CHLORIDE 0.9 % IV SOLN
549.0000 mg | Freq: Once | INTRAVENOUS | Status: AC
  Administered 2023-10-10: 550 mg via INTRAVENOUS
  Filled 2023-10-10: qty 55

## 2023-10-10 MED ORDER — SODIUM CHLORIDE 0.9 % IV SOLN: INTRAVENOUS | Status: DC

## 2023-10-10 MED ORDER — FAMOTIDINE IN NACL 20-0.9 MG/50ML-% IV SOLN
20.0000 mg | Freq: Once | INTRAVENOUS | Status: AC
  Administered 2023-10-10: 20 mg via INTRAVENOUS
  Filled 2023-10-10: qty 50

## 2023-10-10 NOTE — Progress Notes (Signed)
 Patient Care Team: [name], DO as PCP - General (Family Medicine) Lonni Slain, MD as PCP - Cardiology (Cardiology) [facility] (Ophthalmology) Ladora, Ross Lacy Cohn, MD as Referring Physician (Optometry) Armbruster, Elspeth SQUIBB, MD as Consulting Physician (Gastroenterology)  Clinic Day:  10/10/2023  Referring physician: [name], DO   CHIEF COMPLAINT:  CC: Right lung mass    ASSESSMENT & PLAN:   Assessment & Plan: Heather [name]  is a 68 y.o. 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  Considering the extent of the disease and bilateral nature of, chemoRT is not an option for the patient.  Recommended systemic chemoimmunotherapy Patient had port placed Caris: No targetable mutations, PD-L1: 0 Guardant360: ERBB2 amplification present, CHEK2 present, other alterations: TP 53 and RB1 Started on carboplatin  plus paclitaxel  plus ipilimumab  plus nivolumab  on 09/20/2023  -C1D22 today. Patient tolerated first treatment well but reported bone pain from G-CSF. -Labs reviewed today: CMP: WNL, CBC: WBC: Normal, hemoglobin: 11.7, platelets: 434, recent TSH: 2.263, T4: 9.8 -Physical exam stable.  Proceed with the chemoimmunotherapy today. - Will repeat PET scan prior to next cycle.  Return to clinic in 3 weeks with a PET scan Anxiety Significant anxiety exacerbated by cancer diagnosis and personal losses. Current medication, Buspar , is inadequate.  - Prescribed Ambien  for sleep disturbances related to anxiety.  Bone pain due to G-CSF Patient reported significant bone pain after G-CSF.  - We will not give G-CSF this  cycle.-Continue to monitor Acute cystitis without hematuria Patient reports increased frequency in the night.  Denies any dysuria. Denies fever, chills, abdominal pain UA consistent with UTI  - Urine culture pending at this time but patient has no history of previous UTIs - Prescribed nitrofurantoin  100 mg twice daily for 5 days  Tobacco use 40-year smoking history with recent cessation attempt.  Patient quit smoking on 08/08/2023   - Encouraged attempts to abstain from smoking  The patient understands the plans discussed today and is in agreement with them.  She knows to contact our office if she develops concerns prior to her next appointment.  I provided 30 minutes of face-to-face time during this encounter and > 50% was spent counseling as documented under my assessment and plan.    Mickiel Dry, MD  [facility] [place] - A DEPT OF [facility] [address] [place] [address] Dept: [phone] Dept Fax: [phone]   Orders Placed This Encounter  Procedures   NM PET Image Restag (PS) Skull Base To Thigh    Standing Status:   Future    Expected Date:   11/09/2023    Expiration Date:   10/09/2024    If indicated for the ordered procedure, I authorize the administration of a radiopharmaceutical per Radiology protocol:   Yes    Preferred imaging location?:   [facility]     ONCOLOGY HISTORY:   Oncology History  Non-small cell lung cancer, right (HCC)  06/26/2023 Imaging   CT chest low dose without contrast:  IMPRESSION: 1. Lung-RADS 4B, suspicious. Additional imaging evaluation or consultation with Pulmonology or Thoracic Surgery recommended. Irregular spiculated partially cavitary solid 3.6 cm peripheral apical right upper  lobe lung mass, highly suspicious for primary bronchogenic carcinoma. At least 7 additional solid pulmonary nodules scattered in the right greater than left lungs, largest 8.6 mm in the  posterior right lower lobe, and including a cavitary 8 mm left lower lobe nodule, suspicious for bilateral pulmonary metastases. Mildly enlarged right paratracheal lymph node, suspicious for nodal metastasis.    07/19/2023 PET scan   IMPRESSION: Dominant right apical cavitary lesion is diffusely hypermetabolic consistent with area of malignancy. There are several metastatic nodes identified involving the right hilum, thoracic inlet, supraclavicular region as well as along the mediastinum, right greater than left. There are a few other small lung nodules bilaterally which have low-level uptake. These have a differential and could represent other areas of neoplastic disease versus infectious/inflammatory.  No specific abnormal uptake below the diaphragm    08/07/2023 Imaging   MRI brain W WO contrast:   IMPRESSION: There are several small T2 hyperintensities within the cerebral white matter. These abnormalities are nonspecific and of uncertain etiology or clinical significance. They are more common in older patients and patients with risk factors for vascular disease.   No evidence of metastatic disease.   08/08/2023 Imaging   CT chest super D without contrast:  1. Spiculated, partially cavitary and FDG avid mass of the peripheral right apex measures 3.8 x 2.4 cm, consistent with primary bronchogenic malignancy. 2. Multiple bilateral pulmonary nodules are unchanged, including a partially cavitary subpleural nodule of the dependent superior segment left lower lobe. 3. Redemonstrated enlarged mediastinal and right hilar lymph nodes, previously FDG avid, consistent with nodal metastatic disease.   08/13/2023 Pathology Results   FINAL MICROSCOPIC DIAGNOSIS:  A. LUNG, RUL, NEEDLE ASPIRATION:  - Non-small cell carcinoma, favor squamous cell carcinoma.   B. LYMPH NODE, 4R, FINE NEEDLE ASPIRATION:  - Positive for malignancy.  - Metastatic non-small cell carcinoma is present.   C. LYMPH NODE, 7,  FINE NEEDLE ASPIRATION:  - Positive for malignancy.  - Metastatic non-small cell carcinoma is present.     08/23/2023 Cancer Staging   Staging form: Lung, AJCC V9 - Pathologic stage from 08/23/2023: Stage IVA (pT2, pN3, cM1a) - Signed by Davonna Siad, MD on 08/23/2023 Stage prefix: Initial diagnosis   09/05/2023 Procedure   IR guided port placement   09/07/2023 Pathology Results   Caris:   ALK, BRAF, EGFR, KRAS, MET, RET, ROS1: Negative  RB1, TP53: Positive, MSI-Stable, PDL1: Negative, 0%, TMB: low   09/14/2023 Pathology Results   Guardant360: ERBB2 amplification present, CHEK2 present, other alterations: TP 53 and RB1   09/20/2023 -  Chemotherapy   Patient is on Treatment Plan : LUNG NSCLC SQUAMOUS Nivolumab  + Ipilimumab  + Carboplatin  + Paclitaxel  q42d X 1 cycle / Nivolumab  + Ipilimumab  q42d         Current Treatment: Carboplatin  + paclitaxel + ipilimumab  +nivolumab   INTERVAL HISTORY:  Heather [name] is here today for follow up. Patient is accompanied by her husband.   Patient reported that she had increased urination overnight for the past couple of days.  She has no dysuria, fever or chills.  She also reported that after her bone shot she had significant pain in her bones.  She denies nausea, vomiting, loss of appetite or weight loss.  She has significant hair loss and she shaved her head.  I have reviewed the past medical history, past surgical history, social history and family history with the patient and they are unchanged from previous note.  ALLERGIES:  has no known allergies.  MEDICATIONS:  Current Outpatient Medications  Medication Sig Dispense Refill   Ascorbic Acid (VITAMIN C) 100 MG tablet Take 100 mg by mouth daily.     aspirin  EC 81 MG tablet Take 1 tablet (81 mg total) by mouth daily. Swallow whole.     atorvastatin  (LIPITOR) 40 MG tablet Take 1 tablet (40 mg total) by mouth daily. 100 tablet 3   busPIRone  (BUSPAR ) 30 MG tablet Take 1/2 (one-half) tablet by mouth  twice daily 100 tablet 3   Cholecalciferol (VITAMIN D -3) 25 MCG (1000 UT) CAPS Take by mouth.     dexamethasone  (DECADRON ) 4 MG tablet Take 2 tablets (8mg ) by mouth daily starting the day after carboplatin  for 3 days. Take with food 30 tablet 1   diclofenac  Sodium (VOLTAREN ) 1 % GEL Apply 1 Application topically as needed (pain).     diphenhydrAMINE  (BENADRYL ) 25 MG tablet Take 1 tablet (25 mg total) by mouth every 6 (six) hours for 3 days. 12 tablet 0   famotidine  (PEPCID ) 20 MG tablet Take 1 tablet (20 mg total) by mouth 2 (two) times daily for 3 days. 6 tablet 0   FLUoxetine  (PROZAC ) 40 MG capsule Take 1 capsule (40 mg total) by mouth daily. 100 capsule 3   levothyroxine  (SYNTHROID ) 112 MCG tablet Take 1 tablet (112 mcg total) by mouth daily before breakfast. 100 tablet 3   lidocaine -prilocaine  (EMLA ) cream Apply to affected area once 30 g 3   nitrofurantoin , macrocrystal-monohydrate, (MACROBID ) 100 MG capsule Take 1 capsule (100 mg total) by mouth 2 (two) times daily for 5 days. 10 capsule 0   omeprazole  (PRILOSEC) 40 MG capsule Take 1 capsule (40 mg total) by mouth daily. 100 capsule 3   ondansetron  (ZOFRAN ) 8 MG tablet Take 1 tablet (8 mg total) by mouth every 8 (eight) hours as needed for nausea or vomiting. Start on the third day after carboplatin . 30 tablet 1   prochlorperazine  (COMPAZINE ) 10 MG tablet Take 1 tablet (10 mg total) by mouth every 6 (six) hours as needed for nausea or vomiting. 30 tablet 1   triamcinolone  cream (KENALOG ) 0.1 % Apply 1 Application topically 2 (two) times daily. X7-10 days to patch on left side of face. 30 g 0   valACYclovir  (VALTREX ) 1000 MG tablet TAKE 2 TABLETS BY MOUTH TWICE DAILY FOR 1 DAY AT FEVER BLISTER ONSET 20 tablet 2   zolpidem  (AMBIEN ) 5 MG tablet Take 1 tablet (5 mg total) by mouth at bedtime as needed for sleep. 30 tablet 0   No current facility-administered medications for this visit.   Facility-Administered Medications Ordered in Other Visits   Medication Dose Route Frequency Provider Last Rate Last Admin   0.9 %  sodium chloride  infusion   Intravenous Continuous Camdon Saetern, MD 10 mL/hr at 10/10/23 0840 New Bag at 10/10/23 0840   CARBOplatin  (PARAPLATIN ) 550 mg in sodium chloride  0.9 % 250 mL chemo infusion  550 mg Intravenous Once Beyza Bellino, MD       PACLitaxel  (TAXOL ) 378 mg in sodium chloride  0.9 % 500 mL chemo infusion (> 80mg /m2)  200 mg/m2 (Treatment Plan Recorded) Intravenous Once Mercedies Ganesh, MD 188 mL/hr at 10/10/23 1045 378 mg at 10/10/23 1045    REVIEW OF SYSTEMS:   Constitutional: Denies fevers, chills or abnormal weight loss Eyes: Denies blurriness of vision Ears, nose, mouth, throat, and face: Denies mucositis or sore throat Respiratory: Denies cough, dyspnea or wheezes Cardiovascular: Denies palpitation, chest discomfort or lower extremity swelling Gastrointestinal:  Denies nausea, heartburn or  change in bowel habits Skin: Denies abnormal skin rashes Lymphatics: Denies new lymphadenopathy or easy bruising Neurological:Denies numbness, tingling or new weaknesses Behavioral/Psych: Mood is stable, no new changes  All other systems were reviewed with the patient and are negative.   VITALS:  Blood pressure 136/79, pulse 78, temperature 98 F (36.7 C), temperature source Oral, resp. rate 18, weight 172 lb 13.5 oz (78.4 kg), SpO2 98%.  Wt Readings from Last 3 Encounters:  10/10/23 172 lb 13.5 oz (78.4 kg)  10/02/23 176 lb 2.4 oz (79.9 kg)  09/29/23 171 lb (77.6 kg)    Body mass index is 29.67 kg/m.  Performance status (ECOG): 1 - Symptomatic but completely ambulatory  PHYSICAL EXAM:   GENERAL:alert, no distress and comfortable SKIN: skin color, texture, turgor are normal, no rashes or significant lesions EYES: normal, Conjunctiva are pink and non-injected, sclera clear LUNGS: clear to auscultation and percussion with normal breathing effort HEART: regular rate & rhythm and no murmurs and  no lower extremity edema ABDOMEN:abdomen soft, non-tender and normal bowel sounds Musculoskeletal:no cyanosis of digits and no clubbing  NEURO: alert & oriented x 3 with fluent speech  LABORATORY DATA:  I have reviewed the data as listed    Component Value Date/Time   NA 136 10/09/2023 1024   NA 138 06/15/2023 0934   K 4.2 10/09/2023 1024   CL 102 10/09/2023 1024   CO2 22 10/09/2023 1024   GLUCOSE 94 10/09/2023 1024   BUN 15 10/09/2023 1024   BUN 11 06/15/2023 0934   CREATININE 0.60 10/09/2023 1024   CALCIUM  9.2 10/09/2023 1024   PROT 7.5 10/09/2023 1024   PROT 6.9 06/15/2023 0934   ALBUMIN 3.6 10/09/2023 1024   ALBUMIN 4.4 06/15/2023 0934   AST 14 (L) 10/09/2023 1024   ALT 16 10/09/2023 1024   ALKPHOS 81 10/09/2023 1024   BILITOT 0.7 10/09/2023 1024   BILITOT 0.4 06/15/2023 0934   GFRNONAA >60 10/09/2023 1024   GFRAA 105 01/21/2020 1615    Lab Results  Component Value Date   WBC 8.4 10/09/2023   NEUTROABS 5.6 10/09/2023   HGB 11.7 (L) 10/09/2023   HCT 35.0 (L) 10/09/2023   MCV 91.4 10/09/2023   PLT 434 (H) 10/09/2023      Chemistry      Component Value Date/Time   NA 136 10/09/2023 1024   NA 138 06/15/2023 0934   K 4.2 10/09/2023 1024   CL 102 10/09/2023 1024   CO2 22 10/09/2023 1024   BUN 15 10/09/2023 1024   BUN 11 06/15/2023 0934   CREATININE 0.60 10/09/2023 1024      Component Value Date/Time   CALCIUM  9.2 10/09/2023 1024   ALKPHOS 81 10/09/2023 1024   AST 14 (L) 10/09/2023 1024   ALT 16 10/09/2023 1024   BILITOT 0.7 10/09/2023 1024   BILITOT 0.4 06/15/2023 0934      Latest Reference Range & Units 09/18/23 09:45  TSH 0.350 - 4.500 uIU/mL 2.263  Thyroxine (T4) 4.5 - 12.0 ug/dL 9.8    RADIOGRAPHIC STUDIES: I have personally reviewed the radiological images as listed and agreed with the findings in the report.  None new to review

## 2023-10-10 NOTE — Assessment & Plan Note (Addendum)
 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  Considering the extent of the disease and bilateral nature of, chemoRT is not an option for the patient.  Recommended systemic chemoimmunotherapy Patient had port placed Caris: No targetable mutations, PD-L1: 0 Guardant360: ERBB2 amplification present, CHEK2 present, other alterations: TP 53 and RB1 Started on carboplatin  plus paclitaxel  plus ipilimumab  plus nivolumab  on 09/20/2023  -C1D22 today. Patient tolerated first treatment well but reported bone pain from G-CSF. -Labs reviewed today: CMP: WNL, CBC: WBC: Normal, hemoglobin: 11.7, platelets: 434, recent TSH: 2.263, T4: 9.8 -Physical exam stable.  Proceed with the chemoimmunotherapy today. - Will repeat PET scan prior to next cycle.  Return to clinic in 3 weeks with a PET scan

## 2023-10-10 NOTE — Progress Notes (Signed)

## 2023-10-10 NOTE — Assessment & Plan Note (Addendum)
 Patient reports increased frequency in the night.  Denies any dysuria. Denies fever, chills, abdominal pain UA consistent with UTI  - Urine culture pending at this time but patient has no history of previous UTIs - Prescribed nitrofurantoin  100 mg twice daily for 5 days

## 2023-10-10 NOTE — Patient Instructions (Signed)

## 2023-10-10 NOTE — Assessment & Plan Note (Addendum)
 Significant anxiety exacerbated by cancer diagnosis and personal losses. Current medication, Buspar , is inadequate.  - Prescribed Ambien  for sleep disturbances related to anxiety.

## 2023-10-10 NOTE — Assessment & Plan Note (Signed)
 40-year smoking history with recent cessation attempt.  Patient quit smoking on 08/08/2023  - Encouraged attempts to abstain from smoking

## 2023-10-10 NOTE — Progress Notes (Signed)
 Patient okay for treatment today per Dr. Davonna.

## 2023-10-10 NOTE — Assessment & Plan Note (Signed)
 Patient reported significant bone pain after G-CSF.  - We will not give G-CSF this cycle.-Continue to monitor

## 2023-10-10 NOTE — Patient Instructions (Signed)
 [facility] [place] - A DEPT OF [facility]  Discharge Instructions: Thank you for choosing [facility] to provide your oncology and hematology care.  If you have a lab appointment with the Cancer Center - please note that after April 8th, 2024, all labs will be drawn in the cancer center.  You do not have to check in or register with the main entrance as you have in the past but will complete your check-in in the cancer center.  Wear comfortable clothing and clothing appropriate for easy access to any Portacath or PICC line.   We strive to give you quality time with your provider. You may need to reschedule your appointment if you arrive late (15 or more minutes).  Arriving late affects you and other patients whose appointments are after yours.  Also, if you miss three or more appointments without notifying the office, you may be dismissed from the clinic at the provider's discretion.      For prescription refill requests, have your pharmacy contact our office and allow 72 hours for refills to be completed.    Today you received the following chemotherapy and/or immunotherapy agents opdivo , taxol , and carboplatin .       To help prevent nausea and vomiting after your treatment, we encourage you to take your nausea medication as directed.  BELOW ARE SYMPTOMS THAT SHOULD BE REPORTED IMMEDIATELY: *FEVER GREATER THAN 100.4 F (38 C) OR HIGHER *CHILLS OR SWEATING *NAUSEA AND VOMITING THAT IS NOT CONTROLLED WITH YOUR NAUSEA MEDICATION *UNUSUAL SHORTNESS OF BREATH *UNUSUAL BRUISING OR BLEEDING *URINARY PROBLEMS (pain or burning when urinating, or frequent urination) *BOWEL PROBLEMS (unusual diarrhea, constipation, pain near the anus) TENDERNESS IN MOUTH AND THROAT WITH OR WITHOUT PRESENCE OF ULCERS (sore throat, sores in mouth, or a toothache) UNUSUAL RASH, SWELLING OR PAIN  UNUSUAL VAGINAL DISCHARGE OR ITCHING   Items with * indicate a potential emergency  and should be followed up as soon as possible or go to the Emergency Department if any problems should occur.  Please show the CHEMOTHERAPY ALERT CARD or IMMUNOTHERAPY ALERT CARD at check-in to the Emergency Department and triage nurse.  Should you have questions after your visit or need to cancel or reschedule your appointment, please contact [facility]  - A DEPT OF [facility] [phone]  and follow the prompts.  Office hours are 8:00 a.m. to 4:30 p.m. Monday - Friday. Please note that voicemails left after 4:00 p.m. may not be returned until the following business day.  We are closed weekends and major holidays. You have access to a nurse at all times for urgent questions. Please call the main number to the clinic [phone] and follow the prompts.  For any non-urgent questions, you may also contact your provider using MyChart. We now offer e-Visits for anyone 67 and older to request care online for non-urgent symptoms. For details visit [email or web address].   Also download the MyChart app! Go to the app store, search MyChart, open the app, select [facility], and log in with your MyChart username and password.

## 2023-10-10 NOTE — Progress Notes (Signed)
 Patient has been examined by Dr. Davonna. Vital signs and labs have been reviewed by MD - ANC, Creatinine, LFTs, hemoglobin, and platelets have been reviewed by M.D. - pt may proceed with treatment.  Primary RN and pharmacy notified.

## 2023-10-10 NOTE — Progress Notes (Signed)
 DC GCSF per [facility] from treatment plan.  V.O. Dr Ivery Molt, PharmD

## 2023-10-11 ENCOUNTER — Other Ambulatory Visit: Payer: Self-pay

## 2023-10-11 LAB — URINE CULTURE: Culture: >=100000 — AB

## 2023-10-12 ENCOUNTER — Inpatient Hospital Stay

## 2023-10-16 ENCOUNTER — Encounter: Payer: Self-pay | Admitting: Oncology

## 2023-10-23 ENCOUNTER — Ambulatory Visit

## 2023-10-23 VITALS — BP 136/79 | HR 78 | Ht 64.0 in | Wt 172.0 lb

## 2023-10-23 DIAGNOSIS — Z0000 Encounter for general adult medical examination without abnormal findings: Secondary | ICD-10-CM

## 2023-10-23 NOTE — Progress Notes (Signed)
 Subjective:   Heather [name] is a 68 y.o. who presents for a Medicare Wellness preventive visit.  As a reminder, Annual Wellness Visits don't include a physical exam, and some assessments may be limited, especially if this visit is performed virtually. We may recommend an in-person follow-up visit with your provider if needed.  Visit Complete: Virtual I connected with  Heather [name] on 10/23/23 by a audio enabled telemedicine application and verified that I am speaking with the correct person using two identifiers.  Patient Location: Home  Provider Location: Home Office  I discussed the limitations of evaluation and management by telemedicine. The patient expressed understanding and agreed to proceed.  Vital Signs: Because this visit was a virtual/telehealth visit, some criteria may be missing or patient reported. Any vitals not documented were not able to be obtained and vitals that have been documented are patient reported.  VideoDeclined- This patient declined Heather [name]. Therefore the visit was completed with audio only.  Persons Participating in Visit: Patient.  AWV Questionnaire: No: Patient Medicare AWV questionnaire was not completed prior to this visit.  Cardiac Risk Factors include: advanced age (>38men, >83 women);obesity (BMI >30kg/m2);smoking/ tobacco exposure     Objective:    Today's Vitals   10/23/23 0821  BP: 136/79  Pulse: 78  Weight: 172 lb (78 kg)  Height: 5' 4 (1.626 m)   Body mass index is 29.52 kg/m.     10/23/2023    8:27 AM 10/10/2023    8:04 AM 10/09/2023   10:37 AM 10/02/2023   11:16 AM 09/29/2023   10:36 PM 09/20/2023    8:58 AM 09/18/2023    9:51 AM  Advanced Directives  Does Patient Have a Medical Advance Directive? No No No No No No No  Would patient like information on creating a medical advance directive?  No - Patient declined No - Patient declined No - Patient declined  No - Patient declined No - Patient  declined    Current Medications (verified) Outpatient Encounter Medications as of 10/23/2023  Medication Sig   Ascorbic Acid (VITAMIN C) 100 MG tablet Take 100 mg by mouth daily.   aspirin  EC 81 MG tablet Take 1 tablet (81 mg total) by mouth daily. Swallow whole.   atorvastatin  (LIPITOR) 40 MG tablet Take 1 tablet (40 mg total) by mouth daily.   busPIRone  (BUSPAR ) 30 MG tablet Take 1/2 (one-half) tablet by mouth twice daily   Cholecalciferol (VITAMIN D -3) 25 MCG (1000 UT) CAPS Take by mouth.   dexamethasone  (DECADRON ) 4 MG tablet Take 2 tablets (8mg ) by mouth daily starting the day after carboplatin  for 3 days. Take with food   diclofenac  Sodium (VOLTAREN ) 1 % GEL Apply 1 Application topically as needed (pain).   diphenhydrAMINE  (BENADRYL ) 25 MG tablet Take 1 tablet (25 mg total) by mouth every 6 (six) hours for 3 days.   famotidine  (PEPCID ) 20 MG tablet Take 1 tablet (20 mg total) by mouth 2 (two) times daily for 3 days.   FLUoxetine  (PROZAC ) 40 MG capsule Take 1 capsule (40 mg total) by mouth daily.   levothyroxine  (SYNTHROID ) 112 MCG tablet Take 1 tablet (112 mcg total) by mouth daily before breakfast.   lidocaine -prilocaine  (EMLA ) cream Apply to affected area once   omeprazole  (PRILOSEC) 40 MG capsule Take 1 capsule (40 mg total) by mouth daily.   ondansetron  (ZOFRAN ) 8 MG tablet Take 1 tablet (8 mg total) by mouth every 8 (eight) hours as needed for nausea  or vomiting. Start on the third day after carboplatin .   prochlorperazine  (COMPAZINE ) 10 MG tablet Take 1 tablet (10 mg total) by mouth every 6 (six) hours as needed for nausea or vomiting.   triamcinolone  cream (KENALOG ) 0.1 % Apply 1 Application topically 2 (two) times daily. X7-10 days to patch on left side of face.   valACYclovir  (VALTREX ) 1000 MG tablet TAKE 2 TABLETS BY MOUTH TWICE DAILY FOR 1 DAY AT FEVER BLISTER ONSET   zolpidem  (AMBIEN ) 5 MG tablet Take 1 tablet (5 mg total) by mouth at bedtime as needed for sleep.   No  facility-administered encounter medications on file as of 10/23/2023.    Allergies (verified) Patient has no known allergies.   History: Past Medical History:  Diagnosis Date   Anxiety    Arthritis    Cataract    Depression    Fibromyalgia    GERD (gastroesophageal reflux disease)    History of hiatal hernia    Hyperlipidemia    no meds currently   Hypothyroidism    PONV (postoperative nausea and vomiting)    Thyroid  disease    hypo   Past Surgical History:  Procedure Laterality Date   BRONCHIAL BIOPSY  08/13/2023   Procedure: BRONCHOSCOPY, WITH BIOPSY;  Surgeon: Shelah Lamar RAMAN, MD;  Location: [facility] ENDOSCOPY;  Service: Pulmonary;;   BRONCHIAL NEEDLE ASPIRATION BIOPSY  08/13/2023   Procedure: BRONCHOSCOPY, WITH NEEDLE ASPIRATION BIOPSY;  Surgeon: Shelah Lamar RAMAN, MD;  Location: [facility] ENDOSCOPY;  Service: Pulmonary;;   CATARACT EXTRACTION Left    CATARACT EXTRACTION W/PHACO Right 12/29/2013   Procedure: CATARACT EXTRACTION PHACO AND INTRAOCULAR LENS PLACEMENT (IOC);  Surgeon: Cherene Mania, MD;  Location: [facility] ORS;  Service: Ophthalmology;  Laterality: Right;  CDE:4.84   CHOLECYSTECTOMY N/A 02/26/2023   Procedure: LAPAROSCOPIC CHOLECYSTECTOMY;  Surgeon: Stevie Herlene Righter, MD;  Location: [facility] ORS;  Service: General;  Laterality: N/A;   COLONOSCOPY     EYE SURGERY     cleaned debris from behind eye.   FOOT FRACTURE SURGERY Left 2017   heel   IR IMAGING GUIDED PORT INSERTION  09/05/2023   KNEE SURGERY Right 2018   torn meniscus   TUBAL LIGATION     tubal ligation reversal     VAGINAL HYSTERECTOMY     VIDEO BRONCHOSCOPY WITH ENDOBRONCHIAL NAVIGATION Bilateral 08/13/2023   Procedure: VIDEO BRONCHOSCOPY WITH ENDOBRONCHIAL NAVIGATION;  Surgeon: Shelah Lamar RAMAN, MD;  Location: [facility] ENDOSCOPY;  Service: Pulmonary;  Laterality: Bilateral;   VIDEO BRONCHOSCOPY WITH ENDOBRONCHIAL ULTRASOUND  08/13/2023   Procedure: BRONCHOSCOPY, WITH EBUS;  Surgeon: Shelah Lamar RAMAN, MD;  Location: [facility] ENDOSCOPY;   Service: Pulmonary;;   Family History  Problem Relation Age of Onset   Colon polyps Mother    Colon cancer Mother 88   Cancer Mother    Cancer - Colon Mother    Diabetes Father    Heart attack Father 17   Heart disease Father    Diabetes Sister    Leukemia Sister    Heart disease Sister    Acute lymphoblastic leukemia Sister    Diabetes Sister    Heart disease Sister    Thyroid  disease Sister    Lung cancer Sister    Cancer - Lung Sister    Diabetes Brother    Heart attack Brother 66   Hyperlipidemia Brother    Diabetes Brother    Mental illness Brother    Graves' disease Brother    Heart disease Brother    CAD Brother  Diabetes Brother    CAD Brother    Heart disease Brother    Healthy Son    Rectal cancer Other    Colon cancer Other 48   Esophageal cancer Neg Hx    Stomach cancer Neg Hx    Breast cancer Neg Hx    Social History   Socioeconomic History   Marital status: Married    Spouse name: Quinton   Number of children: 1   Years of education: Not on file   Highest education level: Never attended school  Occupational History   Occupation: retired    Comment: Scientist, water quality and [employer]  Tobacco Use   Smoking status: Former    Current packs/day: 0.00    Average packs/day: 1 pack/day for 39.0 years (39.0 ttl pk-yrs)    Types: Cigarettes    Quit date: 08/08/2023    Years since quitting: 0.2    Passive exposure: Past   Smokeless tobacco: Never  Vaping Use   Vaping status: Never Used  Substance and Sexual Activity   Alcohol use: Not Currently    Comment: occasionally   Drug use: Never   Sexual activity: Not Currently    Birth control/protection: Surgical  Other Topics Concern   Not on file  Social History Narrative   Lives home with husband one level home   One son by first husband who lives in [place]   Social Drivers of Health   Financial Resource Strain: Low Risk  (10/23/2023)   Overall Financial Resource Strain (CARDIA)    Difficulty of Paying  Living Expenses: Not hard at all  Food Insecurity: No Food Insecurity (10/23/2023)   Hunger Vital Sign    Worried About Running Out of Food in the Last Year: Never true    Ran Out of Food in the Last Year: Never true  Transportation Needs: No Transportation Needs (10/23/2023)   PRAPARE - Administrator, Civil Service (Medical): No    Lack of Transportation (Non-Medical): No  Physical Activity: Inactive (10/23/2023)   Exercise Vital Sign    Days of Exercise per Week: 0 days    Minutes of Exercise per Session: 0 min  Stress: No Stress Concern Present (10/23/2023)   [employer] of Occupational Health - Occupational Stress Questionnaire    Feeling of Stress: Not at all  Social Connections: Moderately Integrated (10/23/2023)   Social Connection and Isolation Panel    Frequency of Communication with Friends and Family: More than three times a week    Frequency of Social Gatherings with Friends and Family: Twice a week    Attends Religious Services: 1 to 4 times per year    Active Member of [employer] or Organizations: No    Attends Engineer, structural: Never    Marital Status: Married    Tobacco Counseling Counseling given: Yes    Clinical Intake:  Pre-visit preparation completed: Yes  Pain : No/denies pain     BMI - recorded: 29.52 Nutritional Status: BMI 25 -29 Overweight Nutritional Risks: None Diabetes: No  Lab Results  Component Value Date   HGBA1C 5.6 06/15/2023   HGBA1C 5.4 09/20/2021   HGBA1C 5.6 11/19/2017     How often do you need to have someone help you when you read instructions, pamphlets, or other written materials from your doctor or pharmacy?: 1 - Never  Interpreter Needed?: No  Information entered by :: alia t/cma   Activities of Daily Living     10/23/2023    8:25  AM 02/19/2023    2:01 PM  In your present state of health, do you have any difficulty performing the following activities:  Hearing? 1   Vision? 0   Difficulty  concentrating or making decisions? 0   Walking or climbing stairs? 0   Dressing or bathing? 0   Doing errands, shopping? 0 0  Preparing Food and eating ? N   Using the Toilet? N   In the past six months, have you accidently leaked urine? Y   Do you have problems with loss of bowel control? N   Managing your Medications? N   Managing your Finances? N   Housekeeping or managing your Housekeeping? N     Patient Care Team: [name], DO as PCP - General (Family Medicine) Lonni Slain, MD as PCP - Cardiology ([facility] (Ophthalmology) Ladora Ross Lacy Phebe, MD as Referring Physician (Optometry) Armbruster, Elspeth SQUIBB, MD as Consulting Physician (Gastroenterology)  I have updated your Care Teams any recent Medical Services you may have received from other providers in the past year.     Assessment:   This is a routine wellness examination for Heather [name].  Hearing/Vision screen Hearing Screening - Comments:: Pt have a dif of hearing  Vision Screening - Comments:: Pt denies vision dif/ pt goes to [facility] in [place]-Dr. Lee/last ov 2 yrs prob   Goals Addressed             This Visit's Progress    Exercise 3x per week (30 min per time)   On track      Depression Screen     10/23/2023    8:28 AM 10/10/2023    8:02 AM 10/02/2023   11:12 AM 09/20/2023    8:58 AM 09/18/2023    9:51 AM 09/11/2023   11:06 AM 08/23/2023    1:36 PM  PHQ 2/9 Scores  PHQ - 2 Score 0 0 0 0 0 0 0    Fall Risk     10/23/2023    8:23 AM 06/15/2023    9:32 AM 06/09/2022    8:58 AM 03/13/2022    2:00 PM 03/12/2022    7:39 PM  Fall Risk   Falls in the past year? 0 0 0 0 0  Number falls in past yr: 0 0 0 0 0  Injury with Fall? 0 0 0 0 0  Risk for fall due to : No Fall Risks No Fall Risks No Fall Risks    Follow up Falls evaluation completed Falls evaluation completed Falls evaluation completed Falls prevention discussed;Education provided;Falls evaluation completed      MEDICARE RISK AT HOME:  Medicare Risk at Home Any stairs in or around the home?: Yes If so, are there any without handrails?: Yes Home free of loose throw rugs in walkways, pet beds, electrical cords, etc?: Yes Adequate lighting in your home to reduce risk of falls?: Yes Life alert?: No Use of a cane, walker or w/c?: No Grab bars in the bathroom?: No Shower chair or bench in shower?: Yes Elevated toilet seat or a handicapped toilet?: No  TIMED UP AND GO:  Was the test performed?  no  Cognitive Function: 6CIT completed    03/10/2020   11:05 AM  MMSE - Mini Mental State Exam  Orientation to time 5  Orientation to Place 3  Registration 3  Attention/ Calculation 5  Recall 3  Language- name 2 objects 2  Language- repeat 1  Language- follow 3 step command 3  Language- read & follow direction 1  Write a sentence 1  Copy design 1  Total score 28        10/23/2023    8:27 AM 03/13/2022    2:01 PM 03/11/2021   11:18 AM  6CIT Screen  What Year? 0 points 0 points 0 points  What month? 0 points 0 points 0 points  What time? 0 points 0 points 0 points  Count back from 20 0 points 0 points 0 points  Months in reverse 0 points 0 points 0 points  Repeat phrase 0 points 0 points 0 points  Total Score 0 points 0 points 0 points    Immunizations Immunization History  Administered Date(s) Administered   Influenza,inj,Quad PF,6+ Mos 10/26/2014, 11/19/2017, 10/01/2018   Influenza-Unspecified 11/18/2019, 10/23/2020   Moderna Sars-Covid-2 Vaccination 04/17/2019, 05/16/2019, 11/18/2019   PNEUMOCOCCAL CONJUGATE-20 03/14/2021   Td 09/15/2010   Tdap 03/14/2021   Zoster Recombinant(Shingrix) 05/11/2016, 08/31/2016   Zoster, Live 05/12/2015    Screening Tests Health Maintenance  Topic Date Due   COVID-19 Vaccine (4 - 2025-26 season) 09/17/2023   Influenza Vaccine  11/16/2023 (Originally 08/17/2023)   Mammogram  01/05/2024   DEXA SCAN  06/08/2024   Medicare Annual Wellness (AWV)   10/22/2024   Colonoscopy  09/02/2026   DTaP/Tdap/Td (3 - Td or Tdap) 03/15/2031   Pneumococcal Vaccine: 50+ Years  Completed   Hepatitis C Screening  Completed   Zoster Vaccines- Shingrix  Completed   Meningococcal B Vaccine  Aged Out   Lung Cancer Screening  Discontinued    Health Maintenance Items Addressed: See Nurse Notes at the end of this note  Additional Screening:  Vision Screening: Recommended annual ophthalmology exams for early detection of glaucoma and other disorders of the eye. Is the patient up to date with their annual eye exam?  Yes  Who is the provider or what is the name of the office in which the patient attends annual eye exams? Dr. Jama in [facility] in [place]  Dental Screening: Recommended annual dental exams for proper oral hygiene  Community Resource Referral / Chronic Care Management: CRR required this visit?  No   CCM required this visit?  No   Plan:    I have personally reviewed and noted the following in the patient's chart:   Medical and social history Use of alcohol, tobacco or illicit drugs  Current medications and supplements including opioid prescriptions. Patient is not currently taking opioid prescriptions. Functional ability and status Nutritional status Physical activity Advanced directives List of other physicians Hospitalizations, surgeries, and ER visits in previous 12 months Vitals Screenings to include cognitive, depression, and falls Referrals and appointments  In addition, I have reviewed and discussed with patient certain preventive protocols, quality metrics, and best practice recommendations. A written personalized care plan for preventive services as well as general preventive health recommendations were provided to patient.   Ozie Ned, CMA   10/23/2023   After Visit Summary: (MyChart) Due to this being a telephonic visit, the after visit summary with patients personalized plan was offered to patient via MyChart    Notes: Nothing significant to report at this time.

## 2023-10-23 NOTE — Patient Instructions (Signed)
 [name],  Thank you for taking the time for your Medicare Wellness Visit. I appreciate your continued commitment to your health goals. Please review the care plan we discussed, and feel free to reach out if I can assist you further.  Medicare recommends these wellness visits once per year to help you and your care team stay ahead of potential health issues. These visits are designed to focus on prevention, allowing your provider to concentrate on managing your acute and chronic conditions during your regular appointments.  Please note that Annual Wellness Visits do not include a physical exam. Some assessments may be limited, especially if the visit was conducted virtually. If needed, we may recommend a separate in-person follow-up with your provider.  Ongoing Care Seeing your primary care provider every 3 to 6 months helps us  monitor your health and provide consistent, personalized care.   Referrals If a referral was made during today's visit and you haven't received any updates within two weeks, please contact the referred provider directly to check on the status.  Recommended Screenings:  Health Maintenance  Topic Date Due   COVID-19 Vaccine (4 - 2025-26 season) 09/17/2023   Flu Shot  11/16/2023*   Breast Cancer Screening  01/05/2024   DEXA scan (bone density measurement)  06/08/2024   Medicare Annual Wellness Visit  10/22/2024   Colon Cancer Screening  09/02/2026   DTaP/Tdap/Td vaccine (3 - Td or Tdap) 03/15/2031   Pneumococcal Vaccine for age over 63  Completed   Hepatitis C Screening  Completed   Zoster (Shingles) Vaccine  Completed   Meningitis B Vaccine  Aged Out   Screening for Lung Cancer  Discontinued  *Topic was postponed. The date shown is not the original due date.       10/23/2023    8:27 AM  Advanced Directives  Does Patient Have a Medical Advance Directive? No   Advance Care Planning is important because it: Ensures you receive medical care that aligns with  your values, goals, and preferences. Provides guidance to your family and loved ones, reducing the emotional burden of decision-making during critical moments.  Vision: Annual vision screenings are recommended for early detection of glaucoma, cataracts, and diabetic retinopathy. These exams can also reveal signs of chronic conditions such as diabetes and high blood pressure.  Dental: Annual dental screenings help detect early signs of oral cancer, gum disease, and other conditions linked to overall health, including heart disease and diabetes.  Please see the attached documents for additional preventive care recommendations.

## 2023-10-25 ENCOUNTER — Other Ambulatory Visit: Payer: Self-pay | Admitting: Oncology

## 2023-10-25 DIAGNOSIS — C3491 Malignant neoplasm of unspecified part of right bronchus or lung: Secondary | ICD-10-CM

## 2023-10-26 ENCOUNTER — Other Ambulatory Visit: Payer: Self-pay

## 2023-10-29 ENCOUNTER — Inpatient Hospital Stay: Attending: Oncology | Admitting: Dietician

## 2023-10-29 ENCOUNTER — Telehealth: Payer: Self-pay | Admitting: Dietician

## 2023-10-29 DIAGNOSIS — C3491 Malignant neoplasm of unspecified part of right bronchus or lung: Secondary | ICD-10-CM | POA: Insufficient documentation

## 2023-10-29 DIAGNOSIS — Z5112 Encounter for antineoplastic immunotherapy: Secondary | ICD-10-CM | POA: Insufficient documentation

## 2023-10-29 DIAGNOSIS — Z87891 Personal history of nicotine dependence: Secondary | ICD-10-CM | POA: Insufficient documentation

## 2023-10-29 DIAGNOSIS — Z79620 Long term (current) use of immunosuppressive biologic: Secondary | ICD-10-CM | POA: Insufficient documentation

## 2023-10-29 NOTE — Telephone Encounter (Signed)
 Nutrition Follow-up:  Pt with newly diagnosed stage IV NSCLC with contralateral lymphadenopathy. She is receiving chemoimmunotherapy with carboplatin /taxol  + opdivo  q42d. Patient is under the care of Dr. Davonna.   Spoke with patient via telephone. She reports doing okay. Her appetite is good. Reports eating well despite altered taste. Says nothing taste right. Denies nutrition impact symptoms at this time.   Patient has been more emotional and unsure of why. Says nothing is wrong, but sometimes she just cries. Patient does not feel this way everyday. She has discussed this with Dr. Davonna hoping to hear being emotional was a side effect of chemo treatment.   Patient has lost her hair. This was hard. She wears a wig when going out and wears a hat at home. Husband tells patient she does not need to cover her head while at home. He is supportive, but patient does not feel he understands what she is going through.    Medications: reviewed  Labs: reviewed  Anthropometrics: Wt 172 lb on 10/7 - stable   9/24 - 172 lb 13.5 oz 9/13 - 171 lb  8/26 - 174 lb 12.8 oz 7/21 - 172 lb 3.2 oz   NUTRITION DIAGNOSIS: Food and nutrition related knowledge deficit improved    INTERVENTION:  Continue eating well, including good sources of protein at every meal Support and encouragement  Request LCSW f/u at next infusion - pt appreciative     MONITORING, EVALUATION, GOAL: wt trends, intake    NEXT VISIT: Monday November 17 via telephone

## 2023-10-31 ENCOUNTER — Ambulatory Visit: Admitting: Oncology

## 2023-10-31 ENCOUNTER — Inpatient Hospital Stay

## 2023-10-31 VITALS — BP 151/86 | HR 80 | Temp 96.8°F | Resp 18

## 2023-10-31 DIAGNOSIS — C3491 Malignant neoplasm of unspecified part of right bronchus or lung: Secondary | ICD-10-CM

## 2023-10-31 DIAGNOSIS — Z79620 Long term (current) use of immunosuppressive biologic: Secondary | ICD-10-CM | POA: Diagnosis not present

## 2023-10-31 DIAGNOSIS — Z87891 Personal history of nicotine dependence: Secondary | ICD-10-CM | POA: Diagnosis not present

## 2023-10-31 DIAGNOSIS — Z5112 Encounter for antineoplastic immunotherapy: Secondary | ICD-10-CM | POA: Diagnosis present

## 2023-10-31 LAB — CBC WITH DIFFERENTIAL/PLATELET
Abs Immature Granulocytes: 0.02 K/uL (ref 0.00–0.07)
Basophils Absolute: 0.1 K/uL (ref 0.0–0.1)
Basophils Relative: 1 %
Eosinophils Absolute: 0.2 K/uL (ref 0.0–0.5)
Eosinophils Relative: 2 %
HCT: 34.8 % — ABNORMAL LOW (ref 36.0–46.0)
Hemoglobin: 11.6 g/dL — ABNORMAL LOW (ref 12.0–15.0)
Immature Granulocytes: 0 %
Lymphocytes Relative: 28 %
Lymphs Abs: 1.9 K/uL (ref 0.7–4.0)
MCH: 30.4 pg (ref 26.0–34.0)
MCHC: 33.3 g/dL (ref 30.0–36.0)
MCV: 91.1 fL (ref 80.0–100.0)
Monocytes Absolute: 0.8 K/uL (ref 0.1–1.0)
Monocytes Relative: 12 %
Neutro Abs: 3.8 K/uL (ref 1.7–7.7)
Neutrophils Relative %: 57 %
Platelets: 302 K/uL (ref 150–400)
RBC: 3.82 MIL/uL — ABNORMAL LOW (ref 3.87–5.11)
RDW: 14.3 % (ref 11.5–15.5)
WBC: 6.7 K/uL (ref 4.0–10.5)
nRBC: 0 % (ref 0.0–0.2)

## 2023-10-31 LAB — COMPREHENSIVE METABOLIC PANEL WITH GFR
ALT: 16 U/L (ref 0–44)
AST: 22 U/L (ref 15–41)
Albumin: 4.3 g/dL (ref 3.5–5.0)
Alkaline Phosphatase: 90 U/L (ref 38–126)
Anion gap: 10 (ref 5–15)
BUN: 13 mg/dL (ref 8–23)
CO2: 24 mmol/L (ref 22–32)
Calcium: 9.5 mg/dL (ref 8.9–10.3)
Chloride: 103 mmol/L (ref 98–111)
Creatinine, Ser: 0.64 mg/dL (ref 0.44–1.00)
GFR, Estimated: >60 mL/min (ref >60)
Glucose, Bld: 85 mg/dL (ref 70–99)
Potassium: 4.1 mmol/L (ref 3.5–5.1)
Sodium: 137 mmol/L (ref 135–145)
Total Bilirubin: 0.5 mg/dL (ref 0.0–1.2)
Total Protein: 7.2 g/dL (ref 6.5–8.1)

## 2023-10-31 LAB — MAGNESIUM: Magnesium: 1.9 mg/dL (ref 1.7–2.4)

## 2023-10-31 LAB — TSH: TSH: 3.19 u[IU]/mL (ref 0.350–4.500)

## 2023-10-31 MED ORDER — DIPHENHYDRAMINE HCL 50 MG/ML IJ SOLN
25.0000 mg | Freq: Once | INTRAMUSCULAR | Status: AC
  Administered 2023-10-31: 25 mg via INTRAVENOUS
  Filled 2023-10-31: qty 1

## 2023-10-31 MED ORDER — SODIUM CHLORIDE 0.9 % IV SOLN: INTRAVENOUS | Status: DC

## 2023-10-31 MED ORDER — FAMOTIDINE IN NACL 20-0.9 MG/50ML-% IV SOLN
20.0000 mg | Freq: Once | INTRAVENOUS | Status: AC
  Administered 2023-10-31: 20 mg via INTRAVENOUS
  Filled 2023-10-31: qty 50

## 2023-10-31 MED ORDER — SODIUM CHLORIDE 0.9 % IV SOLN
1.0000 mg/kg | Freq: Once | INTRAVENOUS | Status: AC
  Administered 2023-10-31: 80 mg via INTRAVENOUS
  Filled 2023-10-31: qty 16

## 2023-10-31 MED ORDER — SODIUM CHLORIDE 0.9 % IV SOLN
360.0000 mg | Freq: Once | INTRAVENOUS | Status: AC
  Administered 2023-10-31: 360 mg via INTRAVENOUS
  Filled 2023-10-31: qty 24

## 2023-10-31 NOTE — Progress Notes (Signed)
 SPIRITUAL CARE AND COUNSELING CONSULT NOTE   VISIT SUMMARY Reason for Visit: Chaplain identified Pt on the schedule as a Pt I had not connected with yet and visited to deliver introduction to Spiritual Care  Description of Visit: Upon arrival I found Heather [name] seated in the recliner receiving treatment, and her husband, Heather [name] was there as a support person.  I introduced myself as the chaplain for the cancer center and offered a brief education on the role of a chaplain and the support we can offer to our patients, caregivers, and staff.      Heather [name] shared with me that this was her third cancer treatment today and she is encouraged by the way she has been treated in this clinic- she reflected that personal care is not common in medical treatment today.   IN exploring Heather [name]'s spiritual, emotional, and relational needs and resources I find that presents as emotionally intelligent, well supported at home and in the community, and has solid relationships in her life.  She does not claim any particular religion and says, I'm open to all prayers and good thoughts. She was agreeable to me offering prayer.    Husband is main caregiver.  She is capable of doing most thins for herself at this point.  First chemo was rough, she states, but has gotten better since.  Plan of Care: I will continue to follow up with Heather [name] on a monthly basis.   SPIRITUAL ENCOUNTER                                                                                                                                                                      Type of Visit: Initial Care provided to:: Patient, Significant other (Husband Heather [name]) Referral source: Chaplain assessment Reason for visit:  (Introduction to Spiritual Care)   SPIRITUAL FRAMEWORK  Presenting Themes: Significant life change, Caregiving needs Values/beliefs: Heather [name] apreciates that this clinic makes her feel important and seen. She finds Dr. Davonna to be compassionate and  attentive Community/Connection: Family   GOALS       INTERVENTIONS   Spiritual Care Interventions Made: Established relationship of care and support, Compassionate presence, Reflective listening, Prayer    INTERVENTION OUTCOMES   Outcomes: Connection to spiritual care, Awareness of support  SPIRITUAL CARE PLAN   Spiritual Care Issues Still Outstanding: Chaplain will continue to follow    If immediate needs arise, call chaplain at [phone], if after hours call please consult Heather [name] AMION schedule for chaplain coverage   Heather [name], MDiv Chaplain, [facility] [name] [phone]  10/31/2023 2:15 PM

## 2023-10-31 NOTE — Patient Instructions (Signed)
 [facility] [place] - A DEPT OF [facility]  Discharge Instructions: Thank you for choosing [facility] to provide your oncology and hematology care.  If you have a lab appointment with the Cancer Center - please note that after April 8th, 2024, all labs will be drawn in the cancer center.  You do not have to check in or register with the main entrance as you have in the past but will complete your check-in in the cancer center.  Wear comfortable clothing and clothing appropriate for easy access to any Portacath or PICC line.   We strive to give you quality time with your provider. You may need to reschedule your appointment if you arrive late (15 or more minutes).  Arriving late affects you and other patients whose appointments are after yours.  Also, if you miss three or more appointments without notifying the office, you may be dismissed from the clinic at the provider's discretion.      For prescription refill requests, have your pharmacy contact our office and allow 72 hours for refills to be completed.    Today you received the following chemotherapy and/or immunotherapy agents opdivo and yervoy       To help prevent nausea and vomiting after your treatment, we encourage you to take your nausea medication as directed.  BELOW ARE SYMPTOMS THAT SHOULD BE REPORTED IMMEDIATELY: *FEVER GREATER THAN 100.4 F (38 C) OR HIGHER *CHILLS OR SWEATING *NAUSEA AND VOMITING THAT IS NOT CONTROLLED WITH YOUR NAUSEA MEDICATION *UNUSUAL SHORTNESS OF BREATH *UNUSUAL BRUISING OR BLEEDING *URINARY PROBLEMS (pain or burning when urinating, or frequent urination) *BOWEL PROBLEMS (unusual diarrhea, constipation, pain near the anus) TENDERNESS IN MOUTH AND THROAT WITH OR WITHOUT PRESENCE OF ULCERS (sore throat, sores in mouth, or a toothache) UNUSUAL RASH, SWELLING OR PAIN  UNUSUAL VAGINAL DISCHARGE OR ITCHING   Items with * indicate a potential emergency and should be  followed up as soon as possible or go to the Emergency Department if any problems should occur.  Please show the CHEMOTHERAPY ALERT CARD or IMMUNOTHERAPY ALERT CARD at check-in to the Emergency Department and triage nurse.  Should you have questions after your visit or need to cancel or reschedule your appointment, please contact [facility] [place] - A DEPT OF [facility] [phone]  and follow the prompts.  Office hours are 8:00 a.m. to 4:30 p.m. Monday - Friday. Please note that voicemails left after 4:00 p.m. may not be returned until the following business day.  We are closed weekends and major holidays. You have access to a nurse at all times for urgent questions. Please call the main number to the clinic [phone] and follow the prompts.  For any non-urgent questions, you may also contact your provider using MyChart. We now offer e-Visits for anyone 76 and older to request care online for non-urgent symptoms. For details visit [email or web address].   Also download the MyChart app! Go to the app store, search "MyChart", open the app, select [place], and log in with your MyChart username and password.

## 2023-10-31 NOTE — Progress Notes (Signed)
 Patient stated she  has a knot on her right anterior leg. The  knot is non tender, no streaks or redness.  She feels she had some swelling last week for a couple of days.   Denied SOB or cough.  She also stated she can feel a blood vessel on her posterior right leg but denied pain with touch.  Denied redness, heat, or streaks.  Providers made aware.    Patient seen by Dr. Davonna with verbal order ok to treat.    Ipilimumab  (YERVOY ) Patient Monitoring Assessment   Is the patient experiencing any of the following general symptoms?:  [ ] Difficulty performing normal activities [ ] Feeling sluggish or cold all the time [ ] Unusual weight gain [ ] Constant or unusual headaches [ ] Feeling dizzy or faint [ ] Changes in eyesight (blurry vision, double vision, or other vision problems) [ ] Changes in mood or behavior (ex: decreased sex drive, irritability, or forgetfulness) [ ] Starting new medications (ex: steroids, other medications that lower immune response) [x ]Patient is not experiencing any of the general symptoms above.   Gastrointestinal  Patient is having normal bowel movements each day.  Is this different from baseline? [ ] Yes [x ]No Are your stools watery or do they have a foul smell? [ ] Yes [x ]No Have you seen blood in your stools? [ ] Yes [ x]No Are your stools dark, tarry, or sticky? [ ] Yes [ x]No Are you having pain or tenderness in your belly? [ ] Yes [x ]No  Skin Does your skin itch? [ ] Yes [x ]No Do you have a rash? [ ] Yes [x ]No Has your skin blistered and/or peeled? [ ] Yes [ x]No Do you have sores in your mouth? [ ] Yes [ x]No  Hepatic Has your urine been dark or tea colored? [ ] Yes [x ]No Have you noticed that your skin or the whites of your eyes are turning yellow? [ ] Yes [x ]No Are you bleeding or bruising more easily than normal? [ ] Yes [x ]No Are you nauseous and/or vomiting? [ ] Yes [x ]No Do you have pain on the right side of your stomach? [ ] Yes [x ]No  Neurologic   Are you having unusual weakness of legs, arms, or face? [ ] Yes [ x]No Are you having numbness or tingling in your hands or feet? [ ] Yes [ x]No  Heather [name]   Patient tolerated chemotherapy with no complaints voiced.  Side effects with management reviewed with understanding verbalized.  Port site clean and dry with no bruising or swelling noted at site.  Good blood return noted before and after administration of chemotherapy.  Band aid applied.  Patient left in satisfactory condition with VSS and no s/s of distress noted.

## 2023-11-01 LAB — T4: T4, Total: 11.2 ug/dL (ref 4.5–12.0)

## 2023-11-13 ENCOUNTER — Telehealth: Payer: Self-pay | Admitting: *Deleted

## 2023-11-13 NOTE — Telephone Encounter (Signed)
 Called c/o severe dry mouth. Has tried Biotene and has not been effective.  Per Dr. Armanda recommendation, patient advised to try saltwater and baking soda rinses.  Educated on how to use.  Will let us  know if she does not have good results.  Made her aware that she can use Biotene in between as well.  Verbalized understanding.

## 2023-11-15 ENCOUNTER — Ambulatory Visit (HOSPITAL_COMMUNITY)
Admission: RE | Admit: 2023-11-15 | Discharge: 2023-11-15 | Disposition: A | Discharge status: Home / Self Care | Source: Ambulatory Visit | Attending: Oncology | Admitting: Oncology

## 2023-11-15 DIAGNOSIS — C3491 Malignant neoplasm of unspecified part of right bronchus or lung: Secondary | ICD-10-CM | POA: Diagnosis present

## 2023-11-15 MED ORDER — FLUDEOXYGLUCOSE F - 18 (FDG) INJECTION
8.4100 | Freq: Once | INTRAVENOUS | Status: AC | PRN
  Administered 2023-11-15: 8.41 via INTRAVENOUS

## 2023-11-21 ENCOUNTER — Inpatient Hospital Stay: Attending: Oncology

## 2023-11-21 ENCOUNTER — Inpatient Hospital Stay

## 2023-11-22 ENCOUNTER — Inpatient Hospital Stay

## 2023-11-22 ENCOUNTER — Other Ambulatory Visit: Payer: Self-pay | Admitting: *Deleted

## 2023-11-22 ENCOUNTER — Inpatient Hospital Stay: Attending: Oncology | Admitting: Oncology

## 2023-11-22 VITALS — BP 133/59 | HR 88 | Temp 98.4°F | Resp 18

## 2023-11-22 VITALS — BP 145/74

## 2023-11-22 DIAGNOSIS — G4700 Insomnia, unspecified: Secondary | ICD-10-CM | POA: Diagnosis not present

## 2023-11-22 DIAGNOSIS — Z87891 Personal history of nicotine dependence: Secondary | ICD-10-CM | POA: Insufficient documentation

## 2023-11-22 DIAGNOSIS — C3491 Malignant neoplasm of unspecified part of right bronchus or lung: Secondary | ICD-10-CM

## 2023-11-22 DIAGNOSIS — R682 Dry mouth, unspecified: Secondary | ICD-10-CM | POA: Insufficient documentation

## 2023-11-22 DIAGNOSIS — Z79620 Long term (current) use of immunosuppressive biologic: Secondary | ICD-10-CM | POA: Diagnosis not present

## 2023-11-22 DIAGNOSIS — F419 Anxiety disorder, unspecified: Secondary | ICD-10-CM | POA: Diagnosis not present

## 2023-11-22 DIAGNOSIS — Z23 Encounter for immunization: Secondary | ICD-10-CM | POA: Insufficient documentation

## 2023-11-22 DIAGNOSIS — Z5112 Encounter for antineoplastic immunotherapy: Secondary | ICD-10-CM | POA: Diagnosis present

## 2023-11-22 DIAGNOSIS — C3411 Malignant neoplasm of upper lobe, right bronchus or lung: Secondary | ICD-10-CM | POA: Diagnosis not present

## 2023-11-22 DIAGNOSIS — R21 Rash and other nonspecific skin eruption: Secondary | ICD-10-CM | POA: Diagnosis not present

## 2023-11-22 LAB — CBC WITH DIFFERENTIAL/PLATELET
Abs Immature Granulocytes: 0.02 K/uL (ref 0.00–0.07)
Basophils Absolute: 0.1 K/uL (ref 0.0–0.1)
Basophils Relative: 1 %
Eosinophils Absolute: 0.5 K/uL (ref 0.0–0.5)
Eosinophils Relative: 6 %
HCT: 33.7 % — ABNORMAL LOW (ref 36.0–46.0)
Hemoglobin: 11.3 g/dL — ABNORMAL LOW (ref 12.0–15.0)
Immature Granulocytes: 0 %
Lymphocytes Relative: 18 %
Lymphs Abs: 1.4 K/uL (ref 0.7–4.0)
MCH: 30.2 pg (ref 26.0–34.0)
MCHC: 33.5 g/dL (ref 30.0–36.0)
MCV: 90.1 fL (ref 80.0–100.0)
Monocytes Absolute: 0.8 K/uL (ref 0.1–1.0)
Monocytes Relative: 10 %
Neutro Abs: 5.1 K/uL (ref 1.7–7.7)
Neutrophils Relative %: 65 %
Platelets: 401 K/uL — ABNORMAL HIGH (ref 150–400)
RBC: 3.74 MIL/uL — ABNORMAL LOW (ref 3.87–5.11)
RDW: 13 % (ref 11.5–15.5)
WBC: 7.9 K/uL (ref 4.0–10.5)
nRBC: 0 % (ref 0.0–0.2)

## 2023-11-22 LAB — COMPREHENSIVE METABOLIC PANEL WITH GFR
ALT: 22 U/L (ref 0–44)
AST: 28 U/L (ref 15–41)
Albumin: 3.7 g/dL (ref 3.5–5.0)
Alkaline Phosphatase: 74 U/L (ref 38–126)
Anion gap: 10 (ref 5–15)
BUN: 9 mg/dL (ref 8–23)
CO2: 25 mmol/L (ref 22–32)
Calcium: 9.2 mg/dL (ref 8.9–10.3)
Chloride: 101 mmol/L (ref 98–111)
Creatinine, Ser: 0.53 mg/dL (ref 0.44–1.00)
GFR, Estimated: >60 mL/min (ref >60)
Glucose, Bld: 91 mg/dL (ref 70–99)
Potassium: 3.9 mmol/L (ref 3.5–5.1)
Sodium: 136 mmol/L (ref 135–145)
Total Bilirubin: 0.5 mg/dL (ref 0.0–1.2)
Total Protein: 6.8 g/dL (ref 6.5–8.1)

## 2023-11-22 LAB — MAGNESIUM: Magnesium: 1.6 mg/dL — ABNORMAL LOW (ref 1.7–2.4)

## 2023-11-22 MED ORDER — CEVIMELINE HCL 30 MG PO CAPS: 30.0000 mg | ORAL_CAPSULE | Freq: Three times a day (TID) | ORAL | 0 refills | Status: AC

## 2023-11-22 MED ORDER — INFLUENZA VAC SPLIT HIGH-DOSE 0.5 ML IM SUSY
0.5000 mL | PREFILLED_SYRINGE | Freq: Once | INTRAMUSCULAR | Status: AC
  Administered 2023-11-22: 0.5 mL via INTRAMUSCULAR
  Filled 2023-11-22: qty 0.5

## 2023-11-22 MED ORDER — SODIUM CHLORIDE 0.9 % IV SOLN: INTRAVENOUS | Status: DC

## 2023-11-22 MED ORDER — MAGNESIUM SULFATE 2 GM/50ML IV SOLN
2.0000 g | Freq: Once | INTRAVENOUS | Status: AC
  Administered 2023-11-22: 2 g via INTRAVENOUS
  Filled 2023-11-22: qty 50

## 2023-11-22 MED ORDER — SODIUM CHLORIDE 0.9 % IV SOLN
360.0000 mg | Freq: Once | INTRAVENOUS | Status: AC
  Administered 2023-11-22: 360 mg via INTRAVENOUS
  Filled 2023-11-22: qty 24

## 2023-11-22 NOTE — Patient Instructions (Signed)

## 2023-11-22 NOTE — Progress Notes (Signed)
 Patient presents today for Opdivo  infusion per providers order.  Vital signs and labs reviewed by MD.  Message received from Isaiah Janina Dry patient okay for treatment.  Patient will also receive the influenza vaccination and 2 gram of IV Magnesium  per providers order.  Treatment given today per MD orders.  Stable during infusion without adverse affects.  Vital signs stable.  No complaints at this time.  Discharge from clinic ambulatory in stable condition.  Alert and oriented X 3.  Follow up with [facility] as scheduled.

## 2023-11-22 NOTE — Progress Notes (Signed)
 Patient Care Team: Heather Norene HERO, DO as PCP - General (Family Medicine) Heather Slain, [name] as PCP - Cardiology ([facility] (Ophthalmology) Heather Ross Lacy Cohn, [name] as Referring Physician (Optometry) Heather [name], Heather SQUIBB, [name] as Consulting Physician (Gastroenterology)  Clinic Day:  11/22/2023  Referring physician: [name], DO   CHIEF COMPLAINT:  CC: Non-small cell lung carcinoma  ASSESSMENT & PLAN:   Assessment & Plan: Heather [name]  is a 68 y.o. 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 PET scan showed no distant metastatic disease MRI brain negative for brain metastasis  Discussed at tumor board today.  Considering the extent of the disease and bilateral nature of, chemoRT is not an option for the patient.  Recommended systemic chemoimmunotherapy Patient had port placed Caris: No targetable mutations, PD-L1: 0 Guardant360: ERBB2 amplification present, CHEK2 present, other alterations: TP 53 and RB1 Started on carboplatin  plus paclitaxel  plus ipilimumab  plus nivolumab  on 09/20/2023   -C2D22 today. Patient tolerated first cycle well.  She completed chemotherapy at this time. - We reviewed the recent PET scan findings together done after completion of chemotherapy.  She has mixed response with significant improvement in the right upper lobe mass and mediastinal and hilar adenopathy.  She also has enlarging hypermetabolic nodes in the subcarinal and infrahilar region. -Discussed the above findings with the patient and that there is a chance that this can be pseudoprogression.  Recommended continuing treatment with immunotherapy at this time but with a closer monitoring with a repeat PET scan in 6 weeks to assess for response. -If  there is progression on PET scan at that time, will consider changing treatment. -Labs reviewed today: CMP: Stable, magnesium : 1.6, CBC: WBC: Normal, hemoglobin: 11.3, platelets: 401, recent TSH: 3.190, T4: 11.2 -Physical exam stable.  No immunotherapy related side effects reported today.  Proceed with the chemoimmunotherapy today.   Return to clinic in 6 weeks with a PET scan  [name] mouth Patient reported significant [name] mouth from chemotherapy. Did not improve with lozenges, salt and sodium bicarb rinses and mouthwash.  Patient is adequately hydrated.  - Will send a prescription for cevimeline 30 mg to be taken 3 times daily  Anxiety Significant anxiety exacerbated by cancer diagnosis and personal losses.  Buspar , is inadequate.   - Continue Ambien  for sleep disturbances related to anxiety.  Tobacco use 40-year smoking history with recent cessation attempt.  Patient quit smoking on 08/08/2023  - Encouraged attempts to abstain from smoking  Rash Erythematous papular rash very sparsely. Improved with hydrocortisone cream.  - Recommended taking Benadryl  if patient has itchiness.  The patient understands the plans discussed today and is in agreement with them.  She knows to contact our office if she develops concerns prior to her next appointment.  The total time spent in the appointment was 30 minutes for the encounter with patient, including review of chart and various tests results, discussions about plan of care and coordination of care plan    I,Heather [name],acting as a scribe for Heather Dry, [name].,have documented all relevant documentation on the behalf of Heather Dry, [name],as directed by  Heather Dry, [name] while in the presence of Heather Dry, [name].  Heather Dry, [name] Hematology/Oncology [facility]   [place]  [place] [facility] [place]  [place] - A DEPT OF JOLYNN HUNT [facility] [address]  [place] [address] Dept: [phone] Dept Fax: [phone]    ONCOLOGY HISTORY:   Diagnosis: Right lung squamous cell carcinoma  -06/26/2023: CT lung cancer screening: Lung-RADS 4B, suspicious. Irregular spiculated partially cavitary solid 3.6 cm peripheral apical right upper lobe lung mass, highly suspicious for primary bronchogenic carcinoma. At least 7 additional solid pulmonary nodules scattered in the right greater than left lungs, largest 8.6 mm in the posterior right lower lobe, and including a cavitary 8 mm left lower lobe nodule, suspicious for bilateral pulmonary metastases. Mildly enlarged right paratracheal lymph node, suspicious for nodal metastasis. -07/19/2023: Initial PET: Dominant right apical cavitary lesion is diffusely hypermetabolic consistent with area of malignancy. There are several metastatic nodes identified involving the right hilum, thoracic inlet, supraclavicular region as well as along the mediastinum, right greater than left. There are a few other small lung nodules bilaterally which have low-level uptake. These have a differential and could represent other areas of neoplastic disease versus infectious/inflammatory. -08/07/2023: MRI Brain: NED -08/13/2023: Bronchoscopy with RUL and lymph node (4R and 7) FNA's.  Pathology: Non-small cell carcinoma, favor squamous cell carcinoma. Lymph nodes positive for metastatic carcinoma (2/2).  Caris NGS: ALK, BRAF, EGFR, KRAS, MET, RET, ROS1: Negative -RB1, TP53: Positive, MSI-Stable, PDL1: Negative, 0%, TMB: low FoundationOne: CHEK2, SOX2 amplification, ASXL1, RB1,TP53 -08/23/2023: Gaurdant 360: ERBB2 amplification present, CHEK2 present, other alterations: TP 53 and RB1  -09/20/2023-current: Carboplatin , ipilimumab , nivolumab ,  and paclitaxel  -11/15/2023: PET: Mixed response of mediastinal and hilar adenopathy with partial improvement but some new/enlarging hypermetabolic nodes, including a new right eccentric  subcarinal node and a new right infrahilar node. Improved right upper lobe mass. New hypermetabolic hazy subsolid and ground-glass pulmonary opacities in both lungs demonstrating moderate accentuated metabolic activity, which may reflect therapy-related change, atypical infection, or lymphangitic spread. Mildly increased metabolic activity in small portacaval lymph nodes; indeterminate and may warrant surveillance.   Current Treatment: Carboplatin  + paclitaxel + ipilimumab  +nivolumab   INTERVAL HISTORY:  Heather [name] is here today for follow up. Patient is accompanied by her husband.   Patient is very anxious about the PET scan results.  We discussed the results in detail and that it showed mixed response.  We discussed continuing treatment at this time and repeating a closer PET scan to assess for response.  Patient reported significant [name] mouth that is not allowing her to even talk.  She has tried Biotene, salt water  and baking soda rinses.  She is requesting for something stronger.  She also reports for rash around her port area that is erythematous and papular.  She is applying hydrocortisone cream to the send has some improvement.  She has no other complaints at this time.  Denies diarrhea, abdominal pain, shortness of breath. I have reviewed the past medical history, past surgical history, social history and family history with the patient and they are unchanged from previous note.  ALLERGIES:  has no known allergies.  MEDICATIONS:  Current Outpatient Medications  Medication Sig Dispense Refill   Ascorbic Acid (VITAMIN C) 100 MG tablet Take 100 mg by mouth daily.     aspirin  EC 81 MG tablet Take 1 tablet (81 mg total) by mouth daily. Swallow whole.     atorvastatin  (LIPITOR) 40 MG tablet Take 1 tablet (40 mg total) by mouth daily. 100 tablet 3   busPIRone  (BUSPAR ) 30 MG tablet Take 1/2 (one-half) tablet by mouth twice daily 100 tablet 3  Cholecalciferol (VITAMIN D -3) 25 MCG (1000 UT)  CAPS Take by mouth.     dexamethasone  (DECADRON ) 4 MG tablet Take 2 tablets (8mg ) by mouth daily starting the day after carboplatin  for 3 days. Take with food 30 tablet 1   diclofenac  Sodium (VOLTAREN ) 1 % GEL Apply 1 Application topically as needed (pain).     diphenhydrAMINE  (BENADRYL ) 25 MG tablet Take 1 tablet (25 mg total) by mouth every 6 (six) hours for 3 days. 12 tablet 0   famotidine  (PEPCID ) 20 MG tablet Take 1 tablet (20 mg total) by mouth 2 (two) times daily for 3 days. 6 tablet 0   FLUoxetine  (PROZAC ) 40 MG capsule Take 1 capsule (40 mg total) by mouth daily. 100 capsule 3   levothyroxine  (SYNTHROID ) 112 MCG tablet Take 1 tablet (112 mcg total) by mouth daily before breakfast. 100 tablet 3   lidocaine -prilocaine  (EMLA ) cream Apply to affected area once 30 g 3   omeprazole  (PRILOSEC) 40 MG capsule Take 1 capsule (40 mg total) by mouth daily. 100 capsule 3   ondansetron  (ZOFRAN ) 8 MG tablet Take 1 tablet (8 mg total) by mouth every 8 (eight) hours as needed for nausea or vomiting. Start on the third day after carboplatin . 30 tablet 1   prochlorperazine  (COMPAZINE ) 10 MG tablet Take 1 tablet (10 mg total) by mouth every 6 (six) hours as needed for nausea or vomiting. 30 tablet 1   triamcinolone  cream (KENALOG ) 0.1 % Apply 1 Application topically 2 (two) times daily. X7-10 days to patch on left side of face. 30 g 0   valACYclovir  (VALTREX ) 1000 MG tablet TAKE 2 TABLETS BY MOUTH TWICE DAILY FOR 1 DAY AT FEVER BLISTER ONSET 20 tablet 2   zolpidem  (AMBIEN ) 5 MG tablet Take 1 tablet (5 mg total) by mouth at bedtime as needed for sleep. 30 tablet 0   No current facility-administered medications for this visit.   Facility-Administered Medications Ordered in Other Visits  Medication Dose Route Frequency Provider Last Rate Last Admin   0.9 %  sodium chloride  infusion   Intravenous Continuous Fallyn Munnerlyn, [name]       Influenza vac split trivalent PF (FLUZONE HIGH-DOSE) injection 0.5 mL  0.5 mL  Intramuscular Once Felix Pratt, [name]       magnesium  sulfate IVPB 2 g 50 mL  2 g Intravenous Once Eymi Lipuma, [name]       nivolumab  (OPDIVO ) 360 mg in sodium chloride  0.9 % 100 mL chemo infusion  360 mg Intravenous Once Chester Romero, [name]        REVIEW OF SYSTEMS:   Constitutional: Denies fevers, chills or abnormal weight loss Eyes: Denies blurriness of vision Ears, nose, mouth, throat, and face: Denies mucositis or sore throat Respiratory: Denies cough, dyspnea or wheezes Cardiovascular: Denies palpitation, chest discomfort or lower extremity swelling Gastrointestinal:  Denies nausea, heartburn or change in bowel habits Skin: Denies abnormal skin rashes Lymphatics: Denies new lymphadenopathy or easy bruising Neurological:Denies numbness, tingling or new weaknesses Behavioral/Psych: Mood is stable, no new changes  All other systems were reviewed with the patient and are negative.   VITALS:  Blood pressure (!) 145/74.  Wt Readings from Last 3 Encounters:  11/22/23 169 lb 12.1 oz (77 kg)  10/31/23 172 lb 9.9 oz (78.3 kg)  10/23/23 172 lb (78 kg)    There is no height or weight on file to calculate BMI.  Performance status (ECOG): 1 - Symptomatic but completely ambulatory  PHYSICAL EXAM:   GENERAL:alert, no  distress and comfortable SKIN: Erythematous papular rash very sparsely on the chest area. EYES: normal, Conjunctiva are pink and non-injected, sclera clear LUNGS: clear to auscultation and percussion with normal breathing effort HEART: regular rate & rhythm and no murmurs and no lower extremity edema ABDOMEN:abdomen soft, non-tender and normal bowel sounds Musculoskeletal:no cyanosis of digits and no clubbing  NEURO: alert & oriented x 3 with fluent speech  LABORATORY DATA:  I have reviewed the data as listed    Component Value Date/Time   NA 136 11/22/2023 0908   NA 138 06/15/2023 0934   K 3.9 11/22/2023 0908   CL 101 11/22/2023 0908   CO2 25 11/22/2023 0908    GLUCOSE 91 11/22/2023 0908   BUN 9 11/22/2023 0908   BUN 11 06/15/2023 0934   CREATININE 0.53 11/22/2023 0908   CALCIUM  9.2 11/22/2023 0908   PROT 6.8 11/22/2023 0908   PROT 6.9 06/15/2023 0934   ALBUMIN 3.7 11/22/2023 0908   ALBUMIN 4.4 06/15/2023 0934   AST 28 11/22/2023 0908   ALT 22 11/22/2023 0908   ALKPHOS 74 11/22/2023 0908   BILITOT 0.5 11/22/2023 0908   BILITOT 0.4 06/15/2023 0934   GFRNONAA >60 11/22/2023 0908   GFRAA 105 01/21/2020 1615    Lab Results  Component Value Date   WBC 7.9 11/22/2023   NEUTROABS 5.1 11/22/2023   HGB 11.3 (L) 11/22/2023   HCT 33.7 (L) 11/22/2023   MCV 90.1 11/22/2023   PLT 401 (H) 11/22/2023      Chemistry      Component Value Date/Time   NA 136 11/22/2023 0908   NA 138 06/15/2023 0934   K 3.9 11/22/2023 0908   CL 101 11/22/2023 0908   CO2 25 11/22/2023 0908   BUN 9 11/22/2023 0908   BUN 11 06/15/2023 0934   CREATININE 0.53 11/22/2023 0908      Component Value Date/Time   CALCIUM  9.2 11/22/2023 0908   ALKPHOS 74 11/22/2023 0908   AST 28 11/22/2023 0908   ALT 22 11/22/2023 0908   BILITOT 0.5 11/22/2023 0908   BILITOT 0.4 06/15/2023 0934      Latest Reference Range & Units 10/31/23 12:05  TSH 0.350 - 4.500 uIU/mL 3.190  Thyroxine (T4) 4.5 - 12.0 ug/dL 88.7    RADIOGRAPHIC STUDIES: I have personally reviewed the radiological images as listed and agreed with the findings in the report.  NM PET Image Restag (PS) Skull Base To Thigh EXAM: PET AND CT SKULL BASE TO MID THIGH 11/15/2023 03:42:19 PM  TECHNIQUE:  RADIOPHARMACEUTICAL: 8.41 mCi F-18 FDG Uptake time 60 minutes. Glucose level 109 mg/dl. Blood pool SUV 2.0.  PET imaging was acquired from the base of the skull to the mid thighs. Non-contrast enhanced computed tomography was obtained for attenuation correction and anatomic localization.  COMPARISON: 07/19/2023  CLINICAL HISTORY: Non small cell lung cancer restaging.  FINDINGS:  HEAD AND  NECK: Bilateral common carotid atheromatous vascular calcification in the neck. No metabolically active cervical lymphadenopathy.  CHEST: Mixed appearance of mediastinum and hilar adenopathy with resolution of some of the prior prevascular adenopathy and some reduced metabolic activity, but also with some areas of increased adenopathy. Index right paratracheal node 0.6 cm in short axis with maximum SUV 8.2, previously 1.2 cm in short axis with maximum SUV 10.5. Subcarinal node maximum SUV 6.8, previously 9.3. On the other hand, a new right eccentric subcarinal lymph node has a maximum SUV of 6.5 and a new right infrahilar node has a maximum SUV  of 7.4.  The previous right upper lobe mass currently measures 2.4 x 1.2 cm on image 42 series 202 with maximum SUV 7.6, previously 3.7 x 2.2 cm with previous maximum SUV of 18.2, compatible with improvement. However, there are scattered hazy subsolid opacities and interstitial accentuation in both upper lobes with associated metabolic activity, new on the left with maximum SUV 6.0. Possibilities include therapy related findings, atypical infection, or an unusual pattern of lymphangitic malignant spread. Likewise, scattered ground glass densities in the lower lobes and right middle lobe have moderately accentuated metabolic activity, meriting surveillance. The previous small right lower lobe nodule is no longer well seen.  Right Porta-Cath tip in the right atrium. Thoracic aortic, coronary artery, and branch vessel atheromatous vascular calcifications.  ABDOMEN AND PELVIS: Mildly accentuated metabolic activity in small portacaval lymph nodes, current maximum SUV 3.9, previous 2.3. Significance uncertain. No metabolically active intraperitoneal mass. No metabolically active lymphadenopathy. Physiologic activity within the gastrointestinal and genitourinary systems.  BONES AND SOFT TISSUE: Systemic atherosclerosis is present, including the  aorta and iliac arteries. No abnormal FDG activity localizes to the bones. No metabolically active aggressive osseous lesion.  IMPRESSION: 1. Mixed response of mediastinal and hilar adenopathy with partial improvement but some new/enlarging hypermetabolic nodes, including a new right eccentric subcarinal node and a new right infrahilar node. 2. Improved right upper lobe mass. 3. New hypermetabolic hazy subsolid and ground-glass pulmonary opacities in both lungs demonstrating moderate accentuated metabolic activity , which may reflect therapy-related change, atypical infection, or lymphangitic spread; recommend close imaging follow-up. 4. Mildly increased metabolic activity in small portacaval lymph nodes; indeterminate and may warrant surveillance.  Electronically signed by: Ryan Salvage [name] 11/16/2023 01:23 PM EDT RP Workstation: [id number]

## 2023-11-22 NOTE — Patient Instructions (Signed)
 [facility] [place] - A DEPT OF . [facility]  Discharge Instructions: Thank you for choosing [facility] to provide your oncology and hematology care.  If you have a lab appointment with the Cancer Center - please note that after April 8th, 2024, all labs will be drawn in the cancer center.  You do not have to check in or register with the main entrance as you have in the past but will complete your check-in in the cancer center.  Wear comfortable clothing and clothing appropriate for easy access to any Portacath or PICC line.   We strive to give you quality time with your provider. You may need to reschedule your appointment if you arrive late (15 or more minutes).  Arriving late affects you and other patients whose appointments are after yours.  Also, if you miss three or more appointments without notifying the office, you may be dismissed from the clinic at the provider's discretion.      For prescription refill requests, have your pharmacy contact our office and allow 72 hours for refills to be completed.    Today you received the following chemotherapy and/or immunotherapy agents Opdivo  Magnesium       To help prevent nausea and vomiting after your treatment, we encourage you to take your nausea medication as directed.  BELOW ARE SYMPTOMS THAT SHOULD BE REPORTED IMMEDIATELY: *FEVER GREATER THAN 100.4 F (38 C) OR HIGHER *CHILLS OR SWEATING *NAUSEA AND VOMITING THAT IS NOT CONTROLLED WITH YOUR NAUSEA MEDICATION *UNUSUAL SHORTNESS OF BREATH *UNUSUAL BRUISING OR BLEEDING *URINARY PROBLEMS (pain or burning when urinating, or frequent urination) *BOWEL PROBLEMS (unusual diarrhea, constipation, pain near the anus) TENDERNESS IN MOUTH AND THROAT WITH OR WITHOUT PRESENCE OF ULCERS (sore throat, sores in mouth, or a toothache) UNUSUAL RASH, SWELLING OR PAIN  UNUSUAL VAGINAL DISCHARGE OR ITCHING   Items with * indicate a potential emergency and should be  followed up as soon as possible or go to the Emergency Department if any problems should occur.  Please show the CHEMOTHERAPY ALERT CARD or IMMUNOTHERAPY ALERT CARD at check-in to the Emergency Department and triage nurse.  Should you have questions after your visit or need to cancel or reschedule your appointment, please contact [facility] [place] - A DEPT OF [facility] [phone]  and follow the prompts.  Office hours are 8:00 a.m. to 4:30 p.m. Monday - Friday. Please note that voicemails left after 4:00 p.m. may not be returned until the following business day.  We are closed weekends and major holidays. You have access to a nurse at all times for urgent questions. Please call the main number to the clinic [phone] and follow the prompts.  For any non-urgent questions, you may also contact your provider using MyChart. We now offer e-Visits for anyone 21 and older to request care online for non-urgent symptoms. For details visit [email or web address].   Also download the MyChart app! Go to the app store, search MyChart, open the app, select [place], and log in with your MyChart username and password.

## 2023-11-22 NOTE — Progress Notes (Signed)
 SPIRITUAL CARE AND COUNSELING CONSULT NOTE   VISIT SUMMARY   Reason for Visit: Chaplain making scheduled follow-up with Pt I previously connected with.  Description of Visit: I entered the room and found Terrie sitting in the chair receiving her treatment, with her husband Bud there as her support person.  Bud left to go get biscuits and Ivette and I talked for approx 30 minutes   I explored Freja's treatment journey and her emotions around it and she mostly expressed positive feelings and an optimistic outlook.  Minerva's care giving needs appear to be fully met and she seems to have good coping skills at her disposal.   In my previous assessment I mentioned that Linnell did not adhere to any one particular faith.  In this visit, she spoke often of heaven and Jesus leading me to believe she was probably raised in a religous home.  This may be an area for exploration in future visits.  Plan of Care: I will continue to follow Nickola on a Monthly basis.   Maude Roll, MDiv  Chaplain, [facility] [email or web address] [phone]   SPIRITUAL ENCOUNTER                                                                                                                                                                      Type of Visit: Follow up Care provided to:: Patient, Significant other (Husband Bud) Conversation partners present during encounter: Nurse Referral source: Chaplain assessment Reason for visit: Routine spiritual support OnCall Visit: No   SPIRITUAL FRAMEWORK  Presenting Themes: Meaning/purpose/sources of inspiration, Values and beliefs, Caregiving needs, Coping tools Values/beliefs: Irelyn loves her husband, Bud- they have a close bond.  She loves time with her son and grandson as well. Community/Connection: Family Strengths: Zest for life, Appreciation of beauty (particually the beauty of life), Hope Needs/Challenges/Barriers: None identified at this time Patient  Stress Factors: None identified Family Stress Factors: None identified   GOALS   Self/Personal Goals: To enjoy life with her family Clinical Care Goals: Provide a space and a relationship of care and support   INTERVENTIONS   Spiritual Care Interventions Made: Compassionate presence, Reflective listening, Narrative/life review, Explored values/beliefs/practices/strengths, Prayer    INTERVENTION OUTCOMES   Outcomes: Connection to spiritual care, Reduced anxiety, Awareness of support  SPIRITUAL CARE PLAN   Spiritual Care Issues Still Outstanding: Chaplain will continue to follow   Maude Roll, MDiv Chaplain, [facility] [email or web address] [phone]  11/22/2023 11:11 AM

## 2023-12-03 ENCOUNTER — Telehealth: Payer: Self-pay | Admitting: Dietician

## 2023-12-03 ENCOUNTER — Inpatient Hospital Stay: Admitting: Dietician

## 2023-12-03 NOTE — Telephone Encounter (Signed)
 Nutrition Follow-up:  Pt with stage IV NSCLC with contralateral lymphadenopathy. She is receiving chemoimmunotherapy with carboplatin /taxol  + opdivo  q42d. Patient is under the care of Dr. Davonna.   Spoke with patient via telephone. Reports doing okay overall. Says dry mouth is so bad she can't stand it. Taking cevimeline as prescribed without improvement. Drinking 3-4 bottles of water  as well as half of a zero drink. Appetite is pretty good despite off taste. Having to eat when not hungry at times. Had pintos and cornbread for dinner which tasted good. Denies nausea or vomiting. Taking metamucil which helps keeps her regular.   Medications: reviewed   Labs: Mg 1.6  Anthropometrics: Last wt 169 lb 12.1 oz on 11/6  10/15 - 172 lb 9.9 oz 9/24 - 172 lb 13.5 oz  8/26 - 174 lb 12.8 oz   NUTRITION DIAGNOSIS: Food and nutrition related knowledge deficit improving    INTERVENTION:  Educated on strategies for taste changes and dry mouth, suggested trying xylitol melts, running cool mist humidifier overnight as well as baking soda salt water  gargles several times daily - will mail handouts Encourage soft moist foods for ease of intake     MONITORING, EVALUATION, GOAL: wt trends, intake   NEXT VISIT: Monday December 15 via telephone

## 2023-12-12 ENCOUNTER — Inpatient Hospital Stay

## 2023-12-12 ENCOUNTER — Other Ambulatory Visit: Payer: Self-pay | Admitting: *Deleted

## 2023-12-12 VITALS — BP 125/63 | HR 93 | Temp 97.4°F | Resp 18

## 2023-12-12 DIAGNOSIS — C3491 Malignant neoplasm of unspecified part of right bronchus or lung: Secondary | ICD-10-CM

## 2023-12-12 DIAGNOSIS — Z5112 Encounter for antineoplastic immunotherapy: Secondary | ICD-10-CM | POA: Diagnosis not present

## 2023-12-12 LAB — CBC WITH DIFFERENTIAL/PLATELET
Abs Immature Granulocytes: 0.02 K/uL (ref 0.00–0.07)
Basophils Absolute: 0.1 K/uL (ref 0.0–0.1)
Basophils Relative: 1 %
Eosinophils Absolute: 0.4 K/uL (ref 0.0–0.5)
Eosinophils Relative: 5 %
HCT: 35.9 % — ABNORMAL LOW (ref 36.0–46.0)
Hemoglobin: 11.9 g/dL — ABNORMAL LOW (ref 12.0–15.0)
Immature Granulocytes: 0 %
Lymphocytes Relative: 19 %
Lymphs Abs: 1.5 K/uL (ref 0.7–4.0)
MCH: 29.8 pg (ref 26.0–34.0)
MCHC: 33.1 g/dL (ref 30.0–36.0)
MCV: 89.8 fL (ref 80.0–100.0)
Monocytes Absolute: 0.9 K/uL (ref 0.1–1.0)
Monocytes Relative: 12 %
Neutro Abs: 4.9 K/uL (ref 1.7–7.7)
Neutrophils Relative %: 63 %
Platelets: 373 K/uL (ref 150–400)
RBC: 4 MIL/uL (ref 3.87–5.11)
RDW: 12.7 % (ref 11.5–15.5)
WBC: 7.8 K/uL (ref 4.0–10.5)
nRBC: 0 % (ref 0.0–0.2)

## 2023-12-12 LAB — COMPREHENSIVE METABOLIC PANEL WITH GFR
ALT: 13 U/L (ref 0–44)
AST: 24 U/L (ref 15–41)
Albumin: 3.8 g/dL (ref 3.5–5.0)
Alkaline Phosphatase: 73 U/L (ref 38–126)
Anion gap: 8 (ref 5–15)
BUN: 13 mg/dL (ref 8–23)
CO2: 26 mmol/L (ref 22–32)
Calcium: 9.6 mg/dL (ref 8.9–10.3)
Chloride: 103 mmol/L (ref 98–111)
Creatinine, Ser: 0.6 mg/dL (ref 0.44–1.00)
GFR, Estimated: >60 mL/min (ref >60)
Glucose, Bld: 86 mg/dL (ref 70–99)
Potassium: 4.2 mmol/L (ref 3.5–5.1)
Sodium: 137 mmol/L (ref 135–145)
Total Bilirubin: 0.5 mg/dL (ref 0.0–1.2)
Total Protein: 7 g/dL (ref 6.5–8.1)

## 2023-12-12 LAB — TSH: TSH: 3.01 u[IU]/mL (ref 0.350–4.500)

## 2023-12-12 LAB — MAGNESIUM: Magnesium: 1.8 mg/dL (ref 1.7–2.4)

## 2023-12-12 MED ORDER — MAGIC MOUTHWASH W/LIDOCAINE: 5.0000 mL | Freq: Four times a day (QID) | ORAL | 0 refills | Status: AC | PRN

## 2023-12-12 MED ORDER — FAMOTIDINE IN NACL 20-0.9 MG/50ML-% IV SOLN
20.0000 mg | Freq: Once | INTRAVENOUS | Status: AC
  Administered 2023-12-12: 20 mg via INTRAVENOUS
  Filled 2023-12-12: qty 50

## 2023-12-12 MED ORDER — DIPHENHYDRAMINE HCL 50 MG/ML IJ SOLN
25.0000 mg | Freq: Once | INTRAMUSCULAR | Status: AC
  Administered 2023-12-12: 25 mg via INTRAVENOUS
  Filled 2023-12-12: qty 1

## 2023-12-12 MED ORDER — SODIUM CHLORIDE 0.9 % IV SOLN: INTRAVENOUS | Status: DC

## 2023-12-12 MED ORDER — SODIUM CHLORIDE 0.9 % IV SOLN
360.0000 mg | Freq: Once | INTRAVENOUS | Status: AC
  Administered 2023-12-12: 360 mg via INTRAVENOUS
  Filled 2023-12-12: qty 24

## 2023-12-12 MED ORDER — SODIUM CHLORIDE 0.9 % IV SOLN
1.0000 mg/kg | Freq: Once | INTRAVENOUS | Status: AC
  Administered 2023-12-12: 80 mg via INTRAVENOUS
  Filled 2023-12-12: qty 16

## 2023-12-12 NOTE — Patient Instructions (Signed)
 [facility] [place] - A DEPT OF [place]  Discharge Instructions: Thank you for choosing  Cancer Center to provide your oncology and hematology care.  If you have a lab appointment with the Cancer Center - please note that after April 8th, 2024, all labs will be drawn in the cancer center.  You do not have to check in or register with the main entrance as you have in the past but will complete your check-in in the cancer center.  Wear comfortable clothing and clothing appropriate for easy access to any Portacath or PICC line.   We strive to give you quality time with your provider. You may need to reschedule your appointment if you arrive late (15 or more minutes).  Arriving late affects you and other patients whose appointments are after yours.  Also, if you miss three or more appointments without notifying the office, you may be dismissed from the clinic at the provider's discretion.      For prescription refill requests, have your pharmacy contact our office and allow 72 hours for refills to be completed.    Today you received the following chemotherapy and/or immunotherapy agents opdivo , yervoy . Nivolumab  Injection What is this medication? NIVOLUMAB  (nye VOL ue mab) treats some types of cancer. It works by helping your immune system slow or stop the spread of cancer cells. It is a monoclonal antibody. This medicine may be used for other purposes; ask your health care provider or pharmacist if you have questions. COMMON BRAND NAME(S): Opdivo  What should I tell my care team before I take this medication? They need to know if you have any of these conditions: Allogeneic stem cell transplant (uses someone else's stem cells) Autoimmune diseases, such as Crohn disease, ulcerative colitis, lupus History of chest radiation Nervous system problems, such as Guillain-Barre syndrome or myasthenia gravis Organ transplant An unusual or allergic reaction to  nivolumab , other medications, foods, dyes, or preservatives Pregnant or trying to get pregnant Breast-feeding How should I use this medication? This medication is infused into a vein. It is given in a hospital or clinic setting. A special MedGuide will be given to you before each treatment. Be sure to read this information carefully each time. Talk to your care team about the use of this medication in children. While it may be prescribed for children as young as 12 years for selected conditions, precautions do apply. Overdosage: If you think you have taken too much of this medicine contact a poison control center or emergency room at once. NOTE: This medicine is only for you. Do not share this medicine with others. What if I miss a dose? Keep appointments for follow-up doses. It is important not to miss your dose. Call your care team if you are unable to keep an appointment. What may interact with this medication? Interactions have not been studied. This list may not describe all possible interactions. Give your health care provider a list of all the medicines, herbs, non-prescription drugs, or dietary supplements you use. Also tell them if you smoke, drink alcohol, or use illegal drugs. Some items may interact with your medicine. What should I watch for while using this medication? Your condition will be monitored carefully while you are receiving this medication. You may need blood work while taking this medication. This medication may cause serious skin reactions. They can happen weeks to months after starting the medication. Contact your care team right away if you notice fevers or flu-like  symptoms with a rash. The rash may be red or purple and then turn into blisters or peeling of the skin. You may also notice a red rash with swelling of the face, lips, or lymph nodes in your neck or under your arms. Tell your care team right away if you have any change in your eyesight. Talk to your care  team if you are pregnant or think you might be pregnant. A negative pregnancy test is required before starting this medication. A reliable form of contraception is recommended while taking this medication and for 5 months after the last dose. Talk to your care team about effective forms of contraception. Do not breast-feed while taking this medication and for 5 months after the last dose. What side effects may I notice from receiving this medication? Side effects that you should report to your care team as soon as possible: Allergic reactions--skin rash, itching, hives, swelling of the face, lips, tongue, or throat Dry cough, shortness of breath or trouble breathing Eye pain, redness, irritation, or discharge with blurry or decreased vision Heart muscle inflammation--unusual weakness or fatigue, shortness of breath, chest pain, fast or irregular heartbeat, dizziness, swelling of the ankles, feet, or hands Hormone gland problems--headache, sensitivity to [place], unusual weakness or fatigue, dizziness, fast or irregular heartbeat, increased sensitivity to cold or heat, excessive sweating, constipation, hair loss, increased thirst or amount of urine, tremors or shaking, irritability Infusion reactions--chest pain, shortness of breath or trouble breathing, feeling faint or lightheaded Kidney injury (glomerulonephritis)--decrease in the amount of urine, red or dark brown urine, foamy or bubbly urine, swelling of the ankles, hands, or feet Liver injury--right upper belly pain, loss of appetite, nausea, Abdella-colored stool, dark yellow or brown urine, yellowing skin or eyes, unusual weakness or fatigue Pain, tingling, or numbness in the hands or feet, muscle weakness, change in vision, confusion or trouble speaking, loss of balance or coordination, trouble walking, seizures Rash, fever, and swollen lymph nodes Redness, blistering, peeling, or loosening of the skin, including inside the mouth Sudden or severe  stomach pain, bloody diarrhea, fever, nausea, vomiting Side effects that usually do not require medical attention (report these to your care team if they continue or are bothersome): Bone, joint, or muscle pain Diarrhea Fatigue Loss of appetite Nausea Skin rash This list may not describe all possible side effects. Call your doctor for medical advice about side effects. You may report side effects to FDA at 1-800-FDA-1088. Where should I keep my medication? This medication is given in a hospital or clinic. It will not be stored at home. NOTE: This sheet is a summary. It may not cover all possible information. If you have questions about this medicine, talk to your doctor, pharmacist, or health care provider.  2024 Elsevier/Gold Standard (2021-05-02 00:00:00)      To help prevent nausea and vomiting after your treatment, we encourage you to take your nausea medication as directed.  BELOW ARE SYMPTOMS THAT SHOULD BE REPORTED IMMEDIATELY: *FEVER GREATER THAN 100.4 F (38 C) OR HIGHER *CHILLS OR SWEATING *NAUSEA AND VOMITING THAT IS NOT CONTROLLED WITH YOUR NAUSEA MEDICATION *UNUSUAL SHORTNESS OF BREATH *UNUSUAL BRUISING OR BLEEDING *URINARY PROBLEMS (pain or burning when urinating, or frequent urination) *BOWEL PROBLEMS (unusual diarrhea, constipation, pain near the anus) TENDERNESS IN MOUTH AND THROAT WITH OR WITHOUT PRESENCE OF ULCERS (sore throat, sores in mouth, or a toothache) UNUSUAL RASH, SWELLING OR PAIN  UNUSUAL VAGINAL DISCHARGE OR ITCHING   Items with * indicate a potential emergency and  should be followed up as soon as possible or go to the Emergency Department if any problems should occur.  Please show the CHEMOTHERAPY ALERT CARD or IMMUNOTHERAPY ALERT CARD at check-in to the Emergency Department and triage nurse.  Should you have questions after your visit or need to cancel or reschedule your appointment, please contact [facility] [place] - A DEPT OF [facility] [phone]  and follow the prompts.  Office hours are 8:00 a.m. to 4:30 p.m. Monday - Friday. Please note that voicemails left after 4:00 p.m. may not be returned until the following business day.  We are closed weekends and major holidays. You have access to a nurse at all times for urgent questions. Please call the main number to the clinic [phone] and follow the prompts.  For any non-urgent questions, you may also contact your provider using MyChart. We now offer e-Visits for anyone 39 and older to request care online for non-urgent symptoms. For details visit [email or web address].   Also download the MyChart app! Go to the app store, search MyChart, open the app, select [place], and log in with your MyChart username and password.

## 2023-12-12 NOTE — Progress Notes (Signed)
 Oncology progress note  Evaluated the patient in the infusion the patient has some erythema.  Continue with some vesicles like rash.  She also has vesicular rash on her lower back, few erythematous vesicles on her feet.  Patient has been applying the hydrocortisone cream with temporary relief.  This did not look very consistent with immunotherapy induced rash even though it is a possibility.  Continue with the hydrocortisone cream.  Will refer to dermatology for further evaluation.  Will send Magic mouthwash for oral care.

## 2023-12-12 NOTE — Progress Notes (Signed)
 Ipilimumab  (YERVOY ) Patient Monitoring Assessment   Is the patient experiencing any of the following general symptoms?:  [ ] Difficulty performing normal activities [ ] Feeling sluggish or cold all the time [ ] Unusual weight gain [ ] Constant or unusual headaches [ ] Feeling dizzy or faint [ ] Changes in eyesight (blurry vision, double vision, or other vision problems) [ x]Changes in mood or behavior (ex: decreased sex drive, irritability, or forgetfulness) [ ] Starting new medications (ex: steroids, other medications that lower immune response) [ ] Patient is not experiencing any of the general symptoms above.   Gastrointestinal  Patient is having 1 bowel movements each day.  Is this different from baseline? [ ] Yes [x ]No Are your stools watery or do they have a foul smell? [ ] Yes [ x]No Have you seen blood in your stools? [ ] Yes [ x]No Are your stools dark, tarry, or sticky? [ ] Yes [x ]No Are you having pain or tenderness in your belly? [ ] Yes [ x]No  Skin Does your skin itch? [ x]Yes [ ] No Do you have a rash? [x ]Yes [ ] No Has your skin blistered and/or peeled? [x ]Yes [ ] No Do you have sores in your mouth? [ x]Yes [ ] No  Hepatic Has your urine been dark or tea colored? [ ] Yes [x ]No Have you noticed that your skin or the whites of your eyes are turning yellow? [ ] Yes [x ]No Are you bleeding or bruising more easily than normal? [ ] Yes [ x]No Are you nauseous and/or vomiting? [ ] Yes [x ]No Do you have pain on the right side of your stomach? [ ] Yes [ x]No  Neurologic  Are you having unusual weakness of legs, arms, or face? [ ] Yes [ x]No Are you having numbness or tingling in your hands or feet? [ ] Yes [x ]No  Bion Todorov D Takuya Lariccia  Dr. Davonna at the bedside to assess patient. Verbal order received to proceed with treatment. Patient to be referred to a skin specialist.   Treatment given today per MD orders. Tolerated infusion without adverse affects. Vital signs stable. No complaints  at this time. Discharged from clinic ambulatory in stable condition. Alert and oriented x 3. F/U with [facility] as scheduled.

## 2023-12-13 LAB — T4: T4, Total: 9.1 ug/dL (ref 4.5–12.0)

## 2023-12-17 ENCOUNTER — Ambulatory Visit: Payer: Self-pay | Admitting: Family Medicine

## 2023-12-17 ENCOUNTER — Encounter: Payer: Self-pay | Admitting: Family Medicine

## 2023-12-17 VITALS — BP 135/72 | HR 85 | Temp 97.5°F | Ht 64.0 in | Wt 165.1 lb

## 2023-12-17 DIAGNOSIS — R238 Other skin changes: Secondary | ICD-10-CM | POA: Diagnosis not present

## 2023-12-17 DIAGNOSIS — F411 Generalized anxiety disorder: Secondary | ICD-10-CM

## 2023-12-17 DIAGNOSIS — E034 Atrophy of thyroid (acquired): Secondary | ICD-10-CM | POA: Diagnosis not present

## 2023-12-17 DIAGNOSIS — L299 Pruritus, unspecified: Secondary | ICD-10-CM

## 2023-12-17 DIAGNOSIS — C3491 Malignant neoplasm of unspecified part of right bronchus or lung: Secondary | ICD-10-CM | POA: Diagnosis not present

## 2023-12-17 MED ORDER — BUSPIRONE HCL 30 MG PO TABS: 30.0000 mg | ORAL_TABLET | Freq: Two times a day (BID) | ORAL | 3 refills | Status: AC

## 2023-12-17 MED ORDER — VALACYCLOVIR HCL 1 G PO TABS: 1000.0000 mg | ORAL_TABLET | Freq: Three times a day (TID) | ORAL | 0 refills | Status: AC

## 2023-12-17 MED ORDER — CLINDAMYCIN PHOS (ONCE-DAILY) 1 % EX GEL: 1.0000 | Freq: Every day | CUTANEOUS | 0 refills | Status: DC

## 2023-12-17 MED ORDER — LEVOCETIRIZINE DIHYDROCHLORIDE 5 MG PO TABS: 2.5000 mg | ORAL_TABLET | Freq: Every evening | ORAL | 3 refills | Status: AC

## 2023-12-17 NOTE — Progress Notes (Signed)
 Subjective: CC: Hypothyroidism follow-up PCP: [name], DO YEP:Heather [name] is a 68 y.o. female presenting to clinic today for:  Patient had thyroid  labs collected with oncology recently.  She is currently being treated for cancer.  She has had 2 doses of chemotherapy and will be proceeding with immunotherapy going forward.  Anticipates upcoming PET scan in about a week and a half with follow-up with oncology the week after.  She has a dermatology appointment scheduled on the seventh of the month because about 3 weeks ago she developed a vesicular rash that affected both sides of her flank, her back and her left lower extremity.  She describes it as itchy and she feels like she can scratch her skin off trying to get to the itch.  She been taking Benadryl  at nighttime to try and help with the itching.  She admits to dry mouth and she has tried everything over-the-counter to help with dry mouth but nothing is working.  She denies any pain at the lesion sites.  No weeping.  She does report some mood lability and is compliant with both BuSpar  15 mg twice daily and Prozac  40 mg daily.  There was some Ambien  added for sleep but she notes that she just been utilizing the Benadryl  and has not proceeded with the Ambien .   ROS: Per HPI  No Known Allergies Past Medical History:  Diagnosis Date   Anxiety    Arthritis    Cataract    Depression    Fibromyalgia    GERD (gastroesophageal reflux disease)    History of hiatal hernia    Hyperlipidemia    no meds currently   Hypothyroidism    Lung cancer (HCC)    PONV (postoperative nausea and vomiting)    Thyroid  disease    hypo    Current Outpatient Medications:    aspirin  EC 81 MG tablet, Take 1 tablet (81 mg total) by mouth daily. Swallow whole., Disp: , Rfl:    atorvastatin  (LIPITOR) 40 MG tablet, Take 1 tablet (40 mg total) by mouth daily., Disp: 100 tablet, Rfl: 3   busPIRone  (BUSPAR ) 30 MG tablet, Take 1/2 (one-half) tablet by  mouth twice daily, Disp: 100 tablet, Rfl: 3   Cholecalciferol (VITAMIN D -3) 25 MCG (1000 UT) CAPS, Take by mouth., Disp: , Rfl:    dexamethasone  (DECADRON ) 4 MG tablet, Take 2 tablets (8mg ) by mouth daily starting the day after carboplatin  for 3 days. Take with food, Disp: 30 tablet, Rfl: 1   diphenhydrAMINE  (BENADRYL ) 25 MG tablet, Take 1 tablet (25 mg total) by mouth every 6 (six) hours for 3 days., Disp: 12 tablet, Rfl: 0   famotidine  (PEPCID ) 20 MG tablet, Take 1 tablet (20 mg total) by mouth 2 (two) times daily for 3 days., Disp: 6 tablet, Rfl: 0   FLUoxetine  (PROZAC ) 40 MG capsule, Take 1 capsule (40 mg total) by mouth daily., Disp: 100 capsule, Rfl: 3   levothyroxine  (SYNTHROID ) 112 MCG tablet, Take 1 tablet (112 mcg total) by mouth daily before breakfast., Disp: 100 tablet, Rfl: 3   lidocaine -prilocaine  (EMLA ) cream, Apply to affected area once, Disp: 30 g, Rfl: 3   magic mouthwash w/lidocaine  SOLN, Take 5 mLs by mouth 4 (four) times daily as needed for mouth pain. Suspension contains equal amounts of Maalox Extra Strength, nystatin, diphenhydramine  and lidocaine ., Disp: 250 mL, Rfl: 0   omeprazole  (PRILOSEC) 40 MG capsule, Take 1 capsule (40 mg total) by mouth daily., Disp: 100 capsule, Rfl:  3   ondansetron  (ZOFRAN ) 8 MG tablet, Take 1 tablet (8 mg total) by mouth every 8 (eight) hours as needed for nausea or vomiting. Start on the third day after carboplatin ., Disp: 30 tablet, Rfl: 1   prochlorperazine  (COMPAZINE ) 10 MG tablet, Take 1 tablet (10 mg total) by mouth every 6 (six) hours as needed for nausea or vomiting., Disp: 30 tablet, Rfl: 1   valACYclovir  (VALTREX ) 1000 MG tablet, TAKE 2 TABLETS BY MOUTH TWICE DAILY FOR 1 DAY AT FEVER BLISTER ONSET, Disp: 20 tablet, Rfl: 2   Ascorbic Acid (VITAMIN C) 100 MG tablet, Take 100 mg by mouth daily., Disp: , Rfl:    cevimeline  (EVOXAC ) 30 MG capsule, Take 1 capsule (30 mg total) by mouth 3 (three) times daily. (Patient not taking: Reported on  12/17/2023), Disp: 90 capsule, Rfl: 0   diclofenac  Sodium (VOLTAREN ) 1 % GEL, Apply 1 Application topically as needed (pain). (Patient not taking: Reported on 12/17/2023), Disp: , Rfl:    triamcinolone  cream (KENALOG ) 0.1 %, Apply 1 Application topically 2 (two) times daily. X7-10 days to patch on left side of face. (Patient not taking: Reported on 12/17/2023), Disp: 30 g, Rfl: 0   zolpidem  (AMBIEN ) 5 MG tablet, Take 1 tablet (5 mg total) by mouth at bedtime as needed for sleep. (Patient not taking: Reported on 12/17/2023), Disp: 30 tablet, Rfl: 0 Social History   Socioeconomic History   Marital status: Married    Spouse name: Quinton   Number of children: 1   Years of education: Not on file   Highest education level: Never attended school  Occupational History   Occupation: retired    Comment: Scientist, [employer]  Tobacco Use   Smoking status: Former    Current packs/day: 0.00    Average packs/day: 1 pack/day for 39.0 years (39.0 ttl pk-yrs)    Types: Cigarettes    Quit date: 08/08/2023    Years since quitting: 0.3    Passive exposure: Past   Smokeless tobacco: Never  Vaping Use   Vaping status: Never Used  Substance and Sexual Activity   Alcohol use: Not Currently    Comment: occasionally   Drug use: Never   Sexual activity: Not Currently    Birth control/protection: Surgical  Other Topics Concern   Not on file  Social History Narrative   Lives home with husband one level home   One son by first husband who lives in [place]   Social Drivers of Health   Financial Resource Strain: Low Risk  (10/23/2023)   Overall Financial Resource Strain (CARDIA)    Difficulty of Paying Living Expenses: Not hard at all  Food Insecurity: No Food Insecurity (10/23/2023)   Hunger Vital Sign    Worried About Running Out of Food in the Last Year: Never true    Ran Out of Food in the Last Year: Never true  Transportation Needs: No Transportation Needs (10/23/2023)   PRAPARE - Therapist, Art (Medical): No    Lack of Transportation (Non-Medical): No  Physical Activity: Inactive (10/23/2023)   Exercise Vital Sign    Days of Exercise per Week: 0 days    Minutes of Exercise per Session: 0 min  Stress: No Stress Concern Present (10/23/2023)   [employer] of Occupational Health - Occupational Stress Questionnaire    Feeling of Stress: Not at all  Social Connections: Moderately Integrated (10/23/2023)   Social Connection and Isolation Panel    Frequency of Communication  with Friends and Family: More than three times a week    Frequency of Social Gatherings with Friends and Family: Twice a week    Attends Religious Services: 1 to 4 times per year    Active Member of [employer] or Organizations: No    Attends Banker Meetings: Never    Marital Status: Married  Catering Manager Violence: Not At Risk (10/23/2023)   Humiliation, Afraid, Rape, and Kick questionnaire    Fear of Current or Ex-Partner: No    Emotionally Abused: No    Physically Abused: No    Sexually Abused: No   Family History  Problem Relation Age of Onset   Colon polyps Mother    Colon cancer Mother 3   Cancer Mother    Cancer - Colon Mother    Diabetes Father    Heart attack Father 35   Heart disease Father    Diabetes Sister    Leukemia Sister    Heart disease Sister    Acute lymphoblastic leukemia Sister    Diabetes Sister    Heart disease Sister    Thyroid  disease Sister    Lung cancer Sister    Cancer - Lung Sister    Diabetes Brother    Heart attack Brother 83   Hyperlipidemia Brother    Diabetes Brother    Mental illness Brother    Graves' disease Brother    Heart disease Brother    CAD Brother    Diabetes Brother    CAD Brother    Heart disease Brother    Healthy Son    Rectal cancer Other    Colon cancer Other 48   Esophageal cancer Neg Hx    Stomach cancer Neg Hx    Breast cancer Neg Hx     Objective: Office vital signs reviewed. BP 135/72    Pulse 85   Temp (!) 97.5 F (36.4 C)   Ht 5' 4 (1.626 m)   Wt 165 lb 2 oz (74.9 kg)   SpO2 93%   BMI 28.34 kg/m   Physical Examination:  General: Awake, alert, nontoxic appearing female, No acute distress.  Alopecia present HEENT:  sclera white, MMM Cardio: regular rate and rhythm, S1S2 heard, no murmurs appreciated Pulm: clear to auscultation bilaterally, no wheezes, rhonchi or rales; normal work of breathing on room air Skin: Scabbing vesicular rash noted along the bilateral flanks and along the mid back.  She has similar lesions along the arch of her left foot.  No weeping, associated erythema or induration.      12/17/2023   10:11 AM 12/12/2023    9:30 AM 11/22/2023   10:38 AM  Depression screen PHQ 2/9  Decreased Interest 0 0 0  Down, Depressed, Hopeless 0 0 0  PHQ - 2 Score 0 0 0  Altered sleeping 0    Tired, decreased energy 2    Change in appetite 0    Feeling bad or failure about yourself  0    Trouble concentrating 0    Moving slowly or fidgety/restless 0    Suicidal thoughts 0    PHQ-9 Score 2    Difficult doing work/chores Not difficult at all        12/17/2023   10:12 AM 06/15/2023    9:32 AM 06/09/2022    8:58 AM 03/14/2021    9:41 AM  GAD 7 : Generalized Anxiety Score  Nervous, Anxious, on Edge 0 0 0 0  Control/stop worrying 0 0 2  0  Worry too much - different things 0 0 2 0  Trouble relaxing 0 0 0 0  Restless 0 0 0 0  Easily annoyed or irritable 0 0 0 0  Afraid - awful might happen 0 0 0 0  Total GAD 7 Score 0 0 4 0  Anxiety Difficulty Not difficult at all Not difficult at all Not difficult at all Not difficult at all   Assessment/ Plan: 68 y.o. female   Hypothyroidism due to acquired atrophy of thyroid   Non-small cell lung cancer, right (HCC)  GAD (generalized anxiety disorder) - Plan: busPIRone  (BUSPAR ) 30 MG tablet  Vesicular rash - Plan: valACYclovir  (VALTREX ) 1000 MG tablet, Clindamycin Phos, Once-Daily, (CLINDAGEL) 1 % GEL  Itching -  Plan: levocetirizine (XYZAL) 5 MG tablet   I reviewed the labs as obtained by her oncologist.  No changes needed to thyroid  medications.  Continue management as directed  Advance BuSpar  to 30 mg twice daily as needed.  Continue Prozac   ?  Disseminated zoster given immunosuppression in the setting of active treatment for non-small cell lung cancer?  I am going to treat her with Valtrex  3 times daily for 10 days as well as give her topical clindamycin to cover for any potential bacterial infection though we discussed that if indeed this is shingles, where somewhat outside of the treatment window and it may not be effective.  Agree with referral to dermatology.  Stop Benadryl  and start Xyzal.  Hopefully this will help with the xerostomia she has been reporting.  May follow-up in 3 to 6 months, sooner if concerns arise   Norene CHRISTELLA Fielding, DO [facility] [phone]

## 2023-12-19 ENCOUNTER — Ambulatory Visit: Payer: Self-pay

## 2023-12-19 NOTE — Telephone Encounter (Signed)
 Returned patient call, patient stated that she things that her rash has spread down to her vagina. She states that she was scratching and thinks that she broke the skin and that is where the blood on her panty liner came from. There has been no blood since this morning. Patient stated that she does not think she needs an appointment right now, she will wait a few more days and decide what to do.

## 2023-12-19 NOTE — Telephone Encounter (Signed)
 FYI Only or Action Required?: FYI only for provider: Pt declines appt just wanted provider to be aware of 1 episode of hematuria today.  Patient was last seen in primary care on 12/17/2023 by [name], DO.  Called Nurse Triage reporting Hematuria.  Symptoms began today.  Interventions attempted: Nothing.  Symptoms are: completely resolved.  Triage Disposition: See Physician Within 24 Hours  Patient/caregiver understands and will follow disposition?: No, wishes to speak with PCP  Copied from CRM #[id number]. Topic: Clinical - Medical Advice >> Dec 19, 2023  9:04 AM Diannia H wrote: Reason for CRM: Patient is calling because she had blood in her bladder pad. She is just requesting a callback from the nurse. She is in no pain. Could you assist? Patients callback number is [phone]. Reason for Disposition  Blood in urine  (Exception: Could be normal menstrual bleeding.)  Answer Assessment - Initial Assessment Questions 1. COLOR of URINE: Describe the color of the urine.  (e.g., tea-colored, pink, red, bloody) Do you have blood clots in your urine? (e.g., none, pea, grape, small coin)     Some blood on pad this AM 2. ONSET: When did the bleeding start?      One time this AM 3. EPISODES: How many times has there been blood in the urine? or How many times today?     X 1 4. PAIN with URINATION: Is there any pain with passing your urine? If Yes, ask: How bad is the pain?  (Scale 1-10; or mild, moderate, severe)     None 5. FEVER: Do you have a fever? If Yes, ask: What is your temperature, how was it measured, and when did it start?     None 6. ASSOCIATED SYMPTOMS: Are you passing urine more frequently than usual?     Denies 7. OTHER SYMPTOMS: Do you have any other symptoms? (e.g., back/flank pain, abdomen pain, vomiting)     None  Protocols used: Urine - Blood In-A-AH

## 2023-12-20 DIAGNOSIS — L299 Pruritus, unspecified: Secondary | ICD-10-CM | POA: Diagnosis not present

## 2023-12-20 DIAGNOSIS — R21 Rash and other nonspecific skin eruption: Secondary | ICD-10-CM | POA: Diagnosis not present

## 2023-12-23 ENCOUNTER — Other Ambulatory Visit: Payer: Self-pay

## 2023-12-23 ENCOUNTER — Encounter (HOSPITAL_COMMUNITY): Payer: Self-pay | Admitting: *Deleted

## 2023-12-23 ENCOUNTER — Emergency Department (HOSPITAL_COMMUNITY)
Admission: EM | Admit: 2023-12-23 | Discharge: 2023-12-23 | Disposition: A | Discharge status: Home / Self Care | Attending: Emergency Medicine | Admitting: Emergency Medicine

## 2023-12-23 DIAGNOSIS — R21 Rash and other nonspecific skin eruption: Secondary | ICD-10-CM

## 2023-12-23 LAB — CBC WITH DIFFERENTIAL/PLATELET
Abs Immature Granulocytes: 0.02 K/uL (ref 0.00–0.07)
Basophils Absolute: 0.1 K/uL (ref 0.0–0.1)
Basophils Relative: 1 %
Eosinophils Absolute: 0.5 K/uL (ref 0.0–0.5)
Eosinophils Relative: 6 %
HCT: 36 % (ref 36.0–46.0)
Hemoglobin: 12 g/dL (ref 12.0–15.0)
Immature Granulocytes: 0 %
Lymphocytes Relative: 21 %
Lymphs Abs: 1.8 K/uL (ref 0.7–4.0)
MCH: 29.3 pg (ref 26.0–34.0)
MCHC: 33.3 g/dL (ref 30.0–36.0)
MCV: 88 fL (ref 80.0–100.0)
Monocytes Absolute: 0.8 K/uL (ref 0.1–1.0)
Monocytes Relative: 10 %
Neutro Abs: 5.2 K/uL (ref 1.7–7.7)
Neutrophils Relative %: 62 %
Platelets: 416 K/uL — ABNORMAL HIGH (ref 150–400)
RBC: 4.09 MIL/uL (ref 3.87–5.11)
RDW: 12.8 % (ref 11.5–15.5)
WBC: 8.4 K/uL (ref 4.0–10.5)
nRBC: 0 % (ref 0.0–0.2)

## 2023-12-23 LAB — URINALYSIS, ROUTINE W REFLEX MICROSCOPIC
Bilirubin Urine: NEGATIVE
Glucose, UA: NEGATIVE mg/dL
Hgb urine dipstick: NEGATIVE
Ketones, ur: NEGATIVE mg/dL
Nitrite: NEGATIVE
Protein, ur: NEGATIVE mg/dL
Specific Gravity, Urine: 1.005 (ref 1.005–1.030)
pH: 7 (ref 5.0–8.0)

## 2023-12-23 LAB — COMPREHENSIVE METABOLIC PANEL WITH GFR
ALT: 13 U/L (ref 0–44)
AST: 21 U/L (ref 15–41)
Albumin: 3.3 g/dL — ABNORMAL LOW (ref 3.5–5.0)
Alkaline Phosphatase: 62 U/L (ref 38–126)
Anion gap: 8 (ref 5–15)
BUN: 12 mg/dL (ref 8–23)
CO2: 24 mmol/L (ref 22–32)
Calcium: 9.5 mg/dL (ref 8.9–10.3)
Chloride: 104 mmol/L (ref 98–111)
Creatinine, Ser: 0.68 mg/dL (ref 0.44–1.00)
GFR, Estimated: >60 mL/min (ref >60)
Glucose, Bld: 114 mg/dL — ABNORMAL HIGH (ref 70–99)
Potassium: 3.8 mmol/L (ref 3.5–5.1)
Sodium: 136 mmol/L (ref 135–145)
Total Bilirubin: 0.8 mg/dL (ref 0.0–1.2)
Total Protein: 7 g/dL (ref 6.5–8.1)

## 2023-12-23 LAB — I-STAT CG4 LACTIC ACID, ED: Lactic Acid, Venous: 1.6 mmol/L (ref 0.5–1.9)

## 2023-12-23 MED ORDER — PREDNISONE 10 MG (21) PO TBPK: ORAL_TABLET | Freq: Every day | ORAL | 0 refills | Status: DC

## 2023-12-23 MED ORDER — DEXAMETHASONE SOD PHOSPHATE PF 10 MG/ML IJ SOLN
10.0000 mg | Freq: Once | INTRAMUSCULAR | Status: AC
  Administered 2023-12-23: 10 mg via INTRAMUSCULAR

## 2023-12-23 MED ORDER — HYDROXYZINE HCL 25 MG PO TABS: 25.0000 mg | ORAL_TABLET | Freq: Four times a day (QID) | ORAL | 0 refills | Status: DC

## 2023-12-23 NOTE — ED Provider Notes (Signed)
 [place] [facility] DEPARTMENT AT [facility] Provider Note   CSN: [id number] Arrival date & time: 12/23/23  1132     Patient presents with: Rash   Heather [name] is a 68 y.o. female who presents for a rash across to her back, anterior torso, and on the upper and lower extremities, sparing the soles of the feet and palms the hands.  Primarily presented today for complaints of persistent and intense itching related to the rash.  She does have a dermatology appointment scheduled for 8 December.  Seen by primary care she was started on valacyclovir  for possible shingles.  She states that she has a small vesicle that forms which then erodes into a flap patch with some scabbing.    Rash      Prior to Admission medications   Medication Sig Start Date End Date Taking? Authorizing Provider  hydrOXYzine  (ATARAX ) 25 MG tablet Take 1 tablet (25 mg total) by mouth every 6 (six) hours. 12/23/23  Yes Myriam Dorn BROCKS, PA  predniSONE  (STERAPRED UNI-PAK 21 TAB) 10 MG (21) TBPK tablet Take by mouth daily. Take 6 tabs by mouth daily  for 2 days, then 5 tabs for 2 days, then 4 tabs for 2 days, then 3 tabs for 2 days, 2 tabs for 2 days, then 1 tab by mouth daily for 2 days 12/23/23  Yes Myriam Dorn BROCKS, PA  Ascorbic Acid (VITAMIN C) 100 MG tablet Take 100 mg by mouth daily.    [provider]  aspirin  EC 81 MG tablet Take 1 tablet (81 mg total) by mouth daily. Swallow whole. 08/18/22   Lonni Slain, MD  atorvastatin  (LIPITOR) 40 MG tablet Take 1 tablet (40 mg total) by mouth daily. 06/15/23   [name], DO  busPIRone  (BUSPAR ) 30 MG tablet Take 1 tablet (30 mg total) by mouth in the morning and at bedtime. 12/17/23   [name], DO  cevimeline  (EVOXAC ) 30 MG capsule Take 1 capsule (30 mg total) by mouth 3 (three) times daily. Patient not taking: Reported on 12/17/2023 11/22/23   Kandala, Hyndavi, MD  Cholecalciferol (VITAMIN D -3) 25 MCG (1000 UT) CAPS Take by  mouth.    [provider]  Clindamycin  Phos, Once-Daily, (CLINDAGEL) 1 % GEL Apply 1 Application topically daily. To affected areas x10-14 days 12/17/23   [name], DO  dexamethasone  (DECADRON ) 4 MG tablet Take 2 tablets (8mg ) by mouth daily starting the day after carboplatin  for 3 days. Take with food 09/13/23   Kandala, Hyndavi, MD  diclofenac  Sodium (VOLTAREN ) 1 % GEL Apply 1 Application topically as needed (pain). Patient not taking: Reported on 12/17/2023 08/13/23   Shelah Lamar RAMAN, MD  diphenhydrAMINE  (BENADRYL ) 25 MG tablet Take 1 tablet (25 mg total) by mouth every 6 (six) hours for 3 days. 09/30/23 12/17/23  Melvenia Motto, MD  famotidine  (PEPCID ) 20 MG tablet Take 1 tablet (20 mg total) by mouth 2 (two) times daily for 3 days. 09/30/23 12/17/23  Melvenia Motto, MD  FLUoxetine  (PROZAC ) 40 MG capsule Take 1 capsule (40 mg total) by mouth daily. 06/15/23   [name], DO  levocetirizine (XYZAL ) 5 MG tablet Take 0.5-1 tablets (2.5-5 mg total) by mouth every evening. To replace benadryl  for itching 12/17/23   Jolinda Norene M, DO  levothyroxine  (SYNTHROID ) 112 MCG tablet Take 1 tablet (112 mcg total) by mouth daily before breakfast. 06/15/23   Jolinda Norene M, DO  lidocaine -prilocaine  (EMLA ) cream Apply to affected area once  09/13/23   Davonna Siad, MD  magic mouthwash w/lidocaine  SOLN Take 5 mLs by mouth 4 (four) times daily as needed for mouth pain. Suspension contains equal amounts of Maalox Extra Strength, nystatin, diphenhydramine  and lidocaine . 12/12/23   Kandala, Hyndavi, MD  omeprazole  (PRILOSEC) 40 MG capsule Take 1 capsule (40 mg total) by mouth daily. 06/15/23   [name], DO  ondansetron  (ZOFRAN ) 8 MG tablet Take 1 tablet (8 mg total) by mouth every 8 (eight) hours as needed for nausea or vomiting. Start on the third day after carboplatin . 09/13/23   Davonna Siad, MD  prochlorperazine  (COMPAZINE ) 10 MG tablet Take 1 tablet (10 mg total) by mouth every  6 (six) hours as needed for nausea or vomiting. 09/13/23   Kandala, Hyndavi, MD  triamcinolone  cream (KENALOG ) 0.1 % Apply 1 Application topically 2 (two) times daily. X7-10 days to patch on left side of face. Patient not taking: Reported on 12/17/2023 10/02/23   Geofm Delon BRAVO, NP  valACYclovir  (VALTREX ) 1000 MG tablet TAKE 2 TABLETS BY MOUTH TWICE DAILY FOR 1 DAY AT FEVER BLISTER ONSET 06/15/23   Jolinda Norene M, DO  valACYclovir  (VALTREX ) 1000 MG tablet Take 1 tablet (1,000 mg total) by mouth 3 (three) times daily for 10 days. 12/17/23 12/27/23  [name], DO    Allergies: Patient has no known allergies.    Review of Systems  Skin:  Positive for rash.  All other systems reviewed and are negative.   Updated Vital Signs BP 138/72   Pulse 95   Temp 98 F (36.7 C) (Oral)   Resp 19   Ht 5' 4 (1.626 m)   Wt 74.8 kg   SpO2 92%   BMI 28.32 kg/m   Physical Exam Vitals and nursing note reviewed.  Constitutional:      General: She is not in acute distress.    Appearance: Normal appearance.  HENT:     Head: Normocephalic and atraumatic.     Mouth/Throat:     Mouth: Mucous membranes are moist. No oral lesions or angioedema.     Pharynx: Oropharynx is clear.  Eyes:     Extraocular Movements: Extraocular movements intact.     Conjunctiva/sclera: Conjunctivae normal.     Pupils: Pupils are equal, round, and reactive to Sjogren.  Cardiovascular:     Rate and Rhythm: Normal rate and regular rhythm.     Pulses: Normal pulses.     Heart sounds: Normal heart sounds. No murmur heard.    No friction rub. No gallop.  Pulmonary:     Effort: Pulmonary effort is normal.     Breath sounds: Normal breath sounds.  Abdominal:     General: Abdomen is flat. Bowel sounds are normal.     Palpations: Abdomen is soft.  Musculoskeletal:        General: Normal range of motion.     Cervical back: Normal range of motion and neck supple.     Right lower leg: No edema.     Left lower leg:  No edema.  Lymphadenopathy:     Cervical: Cervical adenopathy present.     Comments: Globally no lymphadenopathy is appreciated.  Skin:    General: Skin is warm and dry.     Capillary Refill: Capillary refill takes less than 2 seconds.     Comments: Diffuse rash noted across the back, lower and upper extremities.  Is erythematous, flat patches with some scabbing noted.  There is also multiple small vesicles appreciated across  the lower back, upper and lower extremities.  Vesicles are Nikolsky negative.  Neurological:     General: No focal deficit present.     Mental Status: She is alert. Mental status is at baseline.  Psychiatric:        Mood and Affect: Mood normal.          (all labs ordered are listed, but only abnormal results are displayed) Labs Reviewed  CBC WITH DIFFERENTIAL/PLATELET - Abnormal; Notable for the following components:      Result Value   Platelets 416 (*)    All other components within normal limits  COMPREHENSIVE METABOLIC PANEL WITH GFR - Abnormal; Notable for the following components:   Glucose, Bld 114 (*)    Albumin 3.3 (*)    All other components within normal limits  URINALYSIS, ROUTINE W REFLEX MICROSCOPIC - Abnormal; Notable for the following components:   Color, Urine STRAW (*)    Leukocytes,Ua TRACE (*)    Bacteria, UA RARE (*)    All other components within normal limits  I-STAT CG4 LACTIC ACID, ED    EKG: None  Radiology: No results found.   Procedures   Medications Ordered in the ED  dexamethasone  (DECADRON ) injection 10 mg (10 mg Intramuscular Given 12/23/23 1431)                                    Medical Decision Making Risk Prescription drug management.   Medical Decision Making:   MASHA ORBACH is a 68 y.o. female who presented to the ED today with rash and itching detailed above.    External chart has been reviewed including previous labs, imaging. Patient's presentation is complicated by their history of lung  cancer, current cancer treatment.  Complete initial physical exam performed, notably the patient  was alert and oriented in no apparent distress.  Will rest as appreciated in the physical exam.    Reviewed and confirmed nursing documentation for past medical history, family history, social history.    Initial Assessment:   With the patient's presentation of rash, given the distribution and appearance, consider possible herpes zoster however given the fact that it is not in the distribution aligning with dermatomes and is widespread, find this less than likely.  Consider possible pemphigoid vulgaris, bullous pemphigoid.  Also consider staphylococcal skin infection.  There is no new medications or other factors that would place patient at risk for Stevens-Johnson syndrome or toxic epidermal nephrolithiasis.  Further, Theressa is negative and distribution sparing palms and soles as well as lack of oral lesions makes is less likely.   Initial Plan:  Initial lab evaluation placed prior to assessment at triage. Screening labs including CBC and Metabolic panel to evaluate for infectious or metabolic etiology of disease.  Urinalysis with reflex culture ordered to evaluate for UTI or relevant urologic/nephrologic pathology.  Serum lactate assessed prior to assessment Initially manage intense pruritus with Decadron  injection. Objective evaluation as below reviewed   Initial Study Results:   Laboratory  All laboratory results reviewed without evidence of clinically relevant pathology.   Exceptions include: None  Reassessment and Plan:   Patient positive response to Decadron  therefore will prescribe a course of prednisone  to be taken at home, also prescribed course of hydroxyzine  for pruritus.  She has dermatology appointment scheduled for tomorrow, instructed her follow-up scheduled, return to the ED if she has any new onset of shortness of breath,  chest discomfort, or any other concerning signs or  symptoms for more systemic involvement she understands agrees has no further concerns at this time.  Vital signs are stable and find this patient is stable for discharge at this time.       Final diagnoses:  Rash and nonspecific skin eruption    ED Discharge Orders          Ordered    predniSONE  (STERAPRED UNI-PAK 21 TAB) 10 MG (21) TBPK tablet  Daily        12/23/23 1516    hydrOXYzine  (ATARAX ) 25 MG tablet  Every 6 hours        12/23/23 1516               Myriam Dorn BROCKS, [place] 12/23/23 1554

## 2023-12-23 NOTE — ED Triage Notes (Signed)
 Pt states she has had a rash for about 3-4 weeks that is getting worse.  Saw primary care doctor on Monday who said it was shingles and pt started taking acyclovir on Monday, but pt states it is not getting any better and is spreading. Pt has hx of cancer. Pt denies starting any new medications around the time the rash started.

## 2023-12-23 NOTE — Discharge Instructions (Signed)
Follow up with dermatology as scheduled.

## 2023-12-23 NOTE — ED Provider Triage Note (Addendum)
 Emergency Medicine Provider Triage Evaluation Note  Heather [name] , a 68 y.o. female  was evaluated in triage.  Pt complains of rash. Reports symptoms ongoing x 4 weeks. Oncology patient on immunotherapy and chemotherapy. Has seen her oncology team who does not feel treatment is causing her rash. Has an appointment with dermatology tomorrow. Reports rash is itchy, does not hurt but she is miserable from itching and could not wait until tomorrow. No fevers. Rash started on mid back and is now on her abdomen, the top of her left foot and the back of her neck. Starts as blistering and then ruptures and scabs over. No new medications or exposures. No sick contacts. No lesions in her mouth or genitals. No lesions on palms or soles. Originally thought sx were shingles, is on prednisone  and valtrex  with no improvement.  Review of Systems  Positive:  Negative:   Physical Exam  BP (!) 143/75   Pulse (!) 112   Temp 98.1 F (36.7 C)   Resp 18   Ht 5' 4 (1.626 m)   Wt 74.8 kg   SpO2 90%   BMI 28.32 kg/m  Gen:   Awake, no distress   Resp:  Normal effort  MSK:   Moves extremities without difficulty  Other:  Blistering rash present per images below. No mucosal lesions. No pustules.         Medical Decision Making  Medically screening exam initiated at 12:00 PM.  Appropriate orders placed.  [name] was informed that the remainder of the evaluation will be completed by another provider, this initial triage assessment does not replace that evaluation, and the importance of remaining in the ED until their evaluation is complete.     Nora Lauraine LABOR, PA-C 12/23/23 1206    Nora Lauraine LABOR, PA-C 12/23/23 1209

## 2023-12-24 DIAGNOSIS — L120 Bullous pemphigoid: Secondary | ICD-10-CM | POA: Diagnosis not present

## 2023-12-24 DIAGNOSIS — L138 Other specified bullous disorders: Secondary | ICD-10-CM | POA: Diagnosis not present

## 2023-12-27 ENCOUNTER — Encounter (HOSPITAL_COMMUNITY): Admission: RE | Admit: 2023-12-27 | Discharge: 2023-12-27 | Attending: Oncology

## 2023-12-27 DIAGNOSIS — C3491 Malignant neoplasm of unspecified part of right bronchus or lung: Secondary | ICD-10-CM

## 2023-12-27 DIAGNOSIS — C3490 Malignant neoplasm of unspecified part of unspecified bronchus or lung: Secondary | ICD-10-CM | POA: Diagnosis not present

## 2023-12-27 MED ORDER — FLUDEOXYGLUCOSE F - 18 (FDG) INJECTION
8.5500 | Freq: Once | INTRAVENOUS | Status: AC | PRN
  Administered 2023-12-27: 8.55 via INTRAVENOUS

## 2023-12-31 ENCOUNTER — Telehealth: Payer: Self-pay | Admitting: Dietician

## 2023-12-31 ENCOUNTER — Inpatient Hospital Stay: Attending: Oncology | Admitting: Dietician

## 2023-12-31 DIAGNOSIS — L138 Other specified bullous disorders: Secondary | ICD-10-CM | POA: Insufficient documentation

## 2023-12-31 DIAGNOSIS — C3491 Malignant neoplasm of unspecified part of right bronchus or lung: Secondary | ICD-10-CM | POA: Insufficient documentation

## 2023-12-31 DIAGNOSIS — Z87891 Personal history of nicotine dependence: Secondary | ICD-10-CM | POA: Insufficient documentation

## 2023-12-31 DIAGNOSIS — R590 Localized enlarged lymph nodes: Secondary | ICD-10-CM | POA: Insufficient documentation

## 2023-12-31 DIAGNOSIS — R918 Other nonspecific abnormal finding of lung field: Secondary | ICD-10-CM | POA: Insufficient documentation

## 2023-12-31 DIAGNOSIS — R682 Dry mouth, unspecified: Secondary | ICD-10-CM | POA: Insufficient documentation

## 2023-12-31 DIAGNOSIS — F419 Anxiety disorder, unspecified: Secondary | ICD-10-CM | POA: Insufficient documentation

## 2023-12-31 DIAGNOSIS — Z5112 Encounter for antineoplastic immunotherapy: Secondary | ICD-10-CM | POA: Insufficient documentation

## 2023-12-31 NOTE — Telephone Encounter (Signed)
 Nutrition Follow-up:  Pt with stage IV NSCLC with contralateral lymphadenopathy. She is receiving chemoimmunotherapy with carboplatin /taxol  + opdivo  q42d. Patient is under the care of Dr. Davonna.   Spoke with patient via telephone for follow-up. Patient states the last 3 weeks have been nothing but pure pure hell secondary to worsening rash after last immunotherapy on 11/26. Referral placed to dermatology for further evaluation. Patient seen by primary on 12/1 and started on Valtrex  TID due to shingles concern. Rash spread to ears, vagina, and feet which resulted in ED visit for evaluation. Patient given IV steroids with moderate relief of symptoms within 20 minutes. Patient discharged home on steroid taper. She reports rash is drying up. Using clindamycin  gel for bacterial coverage. States her back is going to scar. Patient reports appetite has remained good given steroid taper. Had eggs, bacon, and toast this morning. She denies nausea, vomiting, diarrhea, constipation.    Medications: reviewed   Labs: glucose 114, albumin 3.3  Anthropometrics: Last wt 165 lb 2 oz on 12/1  11/26 - 165 lb 5.5 oz  11/6 - 169 lb 12.1 oz  10/15 - 172 lb 9.9 oz    NUTRITION DIAGNOSIS: Food and nutrition related knowledge deficit improved    INTERVENTION:  Encourage protein source at every meal/snack to support healing Support and encouragement     MONITORING, EVALUATION, GOAL: wt trends, intake   NEXT VISIT: Monday January 12 via telephone

## 2024-01-03 ENCOUNTER — Inpatient Hospital Stay

## 2024-01-03 ENCOUNTER — Inpatient Hospital Stay: Admitting: Oncology

## 2024-01-03 VITALS — BP 129/70 | HR 63 | Resp 19

## 2024-01-03 VITALS — BP 128/73 | HR 76 | Temp 97.7°F | Resp 18 | Wt 163.8 lb

## 2024-01-03 DIAGNOSIS — F419 Anxiety disorder, unspecified: Secondary | ICD-10-CM

## 2024-01-03 DIAGNOSIS — Z5112 Encounter for antineoplastic immunotherapy: Secondary | ICD-10-CM | POA: Diagnosis present

## 2024-01-03 DIAGNOSIS — C3491 Malignant neoplasm of unspecified part of right bronchus or lung: Secondary | ICD-10-CM

## 2024-01-03 DIAGNOSIS — R590 Localized enlarged lymph nodes: Secondary | ICD-10-CM | POA: Diagnosis not present

## 2024-01-03 DIAGNOSIS — L138 Other specified bullous disorders: Secondary | ICD-10-CM | POA: Diagnosis not present

## 2024-01-03 DIAGNOSIS — R918 Other nonspecific abnormal finding of lung field: Secondary | ICD-10-CM | POA: Diagnosis not present

## 2024-01-03 DIAGNOSIS — R682 Dry mouth, unspecified: Secondary | ICD-10-CM | POA: Diagnosis not present

## 2024-01-03 DIAGNOSIS — J984 Other disorders of lung: Secondary | ICD-10-CM

## 2024-01-03 DIAGNOSIS — Z87891 Personal history of nicotine dependence: Secondary | ICD-10-CM | POA: Diagnosis not present

## 2024-01-03 LAB — COMPREHENSIVE METABOLIC PANEL WITH GFR
ALT: 22 U/L (ref 0–44)
AST: 24 U/L (ref 15–41)
Albumin: 4.1 g/dL (ref 3.5–5.0)
Alkaline Phosphatase: 68 U/L (ref 38–126)
Anion gap: 12 (ref 5–15)
BUN: 13 mg/dL (ref 8–23)
CO2: 23 mmol/L (ref 22–32)
Calcium: 9.5 mg/dL (ref 8.9–10.3)
Chloride: 101 mmol/L (ref 98–111)
Creatinine, Ser: 0.65 mg/dL (ref 0.44–1.00)
GFR, Estimated: >60 mL/min (ref >60)
Glucose, Bld: 94 mg/dL (ref 70–99)
Potassium: 4.6 mmol/L (ref 3.5–5.1)
Sodium: 136 mmol/L (ref 135–145)
Total Bilirubin: 0.5 mg/dL (ref 0.0–1.2)
Total Protein: 7 g/dL (ref 6.5–8.1)

## 2024-01-03 LAB — CBC WITH DIFFERENTIAL/PLATELET
Abs Immature Granulocytes: 0.03 K/uL (ref 0.00–0.07)
Basophils Absolute: 0 K/uL (ref 0.0–0.1)
Basophils Relative: 0 %
Eosinophils Absolute: 0 K/uL (ref 0.0–0.5)
Eosinophils Relative: 0 %
HCT: 37.9 % (ref 36.0–46.0)
Hemoglobin: 12.2 g/dL (ref 12.0–15.0)
Immature Granulocytes: 0 %
Lymphocytes Relative: 14 %
Lymphs Abs: 1.5 K/uL (ref 0.7–4.0)
MCH: 29.4 pg (ref 26.0–34.0)
MCHC: 32.2 g/dL (ref 30.0–36.0)
MCV: 91.3 fL (ref 80.0–100.0)
Monocytes Absolute: 0.3 K/uL (ref 0.1–1.0)
Monocytes Relative: 3 %
Neutro Abs: 8.6 K/uL — ABNORMAL HIGH (ref 1.7–7.7)
Neutrophils Relative %: 83 %
Platelets: 405 K/uL — ABNORMAL HIGH (ref 150–400)
RBC: 4.15 MIL/uL (ref 3.87–5.11)
RDW: 13.6 % (ref 11.5–15.5)
WBC: 10.4 K/uL (ref 4.0–10.5)
nRBC: 0 % (ref 0.0–0.2)

## 2024-01-03 LAB — MAGNESIUM: Magnesium: 2 mg/dL (ref 1.7–2.4)

## 2024-01-03 MED ORDER — SODIUM CHLORIDE 0.9 % IV SOLN: INTRAVENOUS | Status: DC

## 2024-01-03 MED ORDER — SODIUM CHLORIDE 0.9 % IV SOLN
360.0000 mg | Freq: Once | INTRAVENOUS | Status: AC
  Administered 2024-01-03: 15:00:00 360 mg via INTRAVENOUS
  Filled 2024-01-03: qty 24

## 2024-01-03 NOTE — Progress Notes (Signed)
 Patient presents today for chemotherapy Opdivo  infusion. Patient is in satisfactory condition with no new complaints voiced.  Vital signs are stable.  Labs reviewed by Dr. Davonna during the office visit and all labs are within treatment parameters.  We will proceed with treatment per MD orders.   Treatment given today per MD orders. Tolerated infusion without adverse affects. Vital signs stable. No complaints at this time. Discharged from clinic ambulatory in stable condition. Alert and oriented x 3. F/U with [facility] as scheduled.

## 2024-01-03 NOTE — Progress Notes (Unsigned)
 Patient has been examined by Dr. Davonna. Vital signs and labs have been reviewed by MD - ANC, Creatinine, LFTs, hemoglobin, and platelets have been reviewed by M.D. - pt may proceed with treatment.  Primary RN and pharmacy notified.

## 2024-01-03 NOTE — Patient Instructions (Signed)
 [facility] at [facility] Discharge Instructions   You were seen and examined today by Dr. Davonna.  She reviewed the results of your lab work which are normal/stable.   She reviewed the results of your PET scan which shows the cancer has shrunk.   We will proceed with your treatment today.   Return as scheduled.    Thank you for choosing [facility] at [facility] to provide your oncology and hematology care.  To afford each patient quality time with our provider, please arrive at least 15 minutes before your scheduled appointment time.   If you have a lab appointment with the Cancer Center please come in thru the Main Entrance and check in at the main information desk.  You need to re-schedule your appointment should you arrive 10 or more minutes late.  We strive to give you quality time with our providers, and arriving late affects you and other patients whose appointments are after yours.  Also, if you no show three or more times for appointments you may be dismissed from the clinic at the providers discretion.     Again, thank you for choosing [facility].  Our hope is that these requests will decrease the amount of time that you wait before being seen by our physicians.       _____________________________________________________________  Should you have questions after your visit to [facility], please contact our office at [phone] and follow the prompts.  Our office hours are 8:00 a.m. and 4:30 p.m. Monday - Friday.  Please note that voicemails left after 4:00 p.m. may not be returned until the following business day.  We are closed weekends and major holidays.  You do have access to a nurse 24-7, just call the main number to the clinic [phone] and do not press any options, hold on the line and a nurse will answer the phone.    For prescription refill requests, have your pharmacy contact our office and  allow 72 hours.    Due to Covid, you will need to wear a mask upon entering the hospital. If you do not have a mask, a mask will be given to you at the Main Entrance upon arrival. For doctor visits, patients may have 1 support person age 45 or older with them. For treatment visits, patients can not have anyone with them due to social distancing guidelines and our immunocompromised population.

## 2024-01-03 NOTE — Progress Notes (Signed)
 Patient Care Team: [name], DO as PCP - General (Family Medicine) Lonni Slain, MD as PCP - Cardiology ([facility] (Ophthalmology) Ladora, Ross Lacy Cohn, MD as Referring Physician (Optometry) Armbruster, Elspeth SQUIBB, MD as Consulting Physician (Gastroenterology) Davonna Siad, MD as Consulting Physician (Oncology)  Clinic Day:  01/03/2024  Referring physician: [name], DO   CHIEF COMPLAINT:  CC: Non-small cell lung carcinoma  ASSESSMENT & PLAN:   Assessment & Plan: Heather [name]  is a 68 y.o. 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 PET scan showed no distant metastatic disease MRI brain negative for brain metastasis  Discussed at tumor board today.  Considering the extent of the disease and bilateral nature of, chemoRT is not an option for the patient.  Recommended systemic chemoimmunotherapy Patient had port placed Caris: No targetable mutations, PD-L1: 0 Guardant360: ERBB2 amplification present, CHEK2 present, other alterations: TP 53 and RB1 Started on carboplatin  plus paclitaxel  plus ipilimumab  plus nivolumab  on 09/20/2023   -C2D22 today. Patient tolerated first cycle well.  She completed chemotherapy at this time. - We reviewed the recent PET scan findings together done after completion of chemotherapy.  She has mixed response with significant improvement in the right upper lobe mass and mediastinal and hilar adenopathy.  She also has enlarging hypermetabolic nodes in the subcarinal and infrahilar region. -Discussed the above findings with the patient and that there is a chance that this can be pseudoprogression.  Recommended continuing treatment with immunotherapy at this time but with a closer monitoring with a  repeat PET scan in 6 weeks to assess for response. -If there is progression on PET scan at that time, will consider changing treatment. -Labs reviewed today: CMP: Stable, magnesium : 1.6, CBC: WBC: Normal, hemoglobin: 11.3, platelets: 401, recent TSH: 3.190, T4: 11.2 -Physical exam stable.  No immunotherapy related side effects reported today.  Proceed with the chemoimmunotherapy today.   Return to clinic in 6 weeks with a PET scan  Dry mouth Patient reported significant dry mouth from chemotherapy. Did not improve with lozenges, salt and sodium bicarb rinses and mouthwash.  Patient is adequately hydrated.  - Will send a prescription for cevimeline  30 mg to be taken 3 times daily  Anxiety Significant anxiety exacerbated by cancer diagnosis and personal losses.  Buspar , is inadequate.   - Continue Ambien  for sleep disturbances related to anxiety.  Tobacco use 40-year smoking history with recent cessation attempt.  Patient quit smoking on 08/08/2023  - Encouraged attempts to abstain from smoking  Rash Erythematous papular rash very sparsely. Improved with hydrocortisone cream.  - Recommended taking Benadryl  if patient has itchiness.  The patient understands the plans discussed today and is in agreement with them.  She knows to contact our office if she develops concerns prior to her next appointment.  The total time spent in the appointment was *** minutes for the encounter with patient, including review of chart and various tests results, discussions about plan of care and coordination of care plan   I,Helena R Teague,acting as a scribe for Siad Davonna, MD.,have documented all relevant documentation on the behalf of Siad Davonna, MD,as directed by  Siad Davonna, MD while in the presence of Siad Davonna, MD.  ***   Heather [name]   [facility] [place] - A DEPT  OF [facility] [address] [place]  [address] Dept: [phone] Dept Fax: [phone]    ONCOLOGY HISTORY:   Diagnosis: Right lung squamous cell carcinoma  -06/26/2023: CT lung cancer screening: Lung-RADS 4B, suspicious. Irregular spiculated partially cavitary solid 3.6 cm peripheral apical right upper lobe lung mass, highly suspicious for primary bronchogenic carcinoma. At least 7 additional solid pulmonary nodules scattered in the right greater than left lungs, largest 8.6 mm in the posterior right lower lobe, and including a cavitary 8 mm left lower lobe nodule, suspicious for bilateral pulmonary metastases. Mildly enlarged right paratracheal lymph node, suspicious for nodal metastasis. -07/19/2023: Initial PET: Dominant right apical cavitary lesion is diffusely hypermetabolic consistent with area of malignancy. There are several metastatic nodes identified involving the right hilum, thoracic inlet, supraclavicular region as well as along the mediastinum, right greater than left. There are a few other small lung nodules bilaterally which have low-level uptake. These have a differential and could represent other areas of neoplastic disease versus infectious/inflammatory. -08/07/2023: MRI Brain: NED -08/13/2023: Bronchoscopy with RUL and lymph node (4R and 7) FNA's.  Pathology: Non-small cell carcinoma, favor squamous cell carcinoma. Lymph nodes positive for metastatic carcinoma (2/2).  Caris NGS: ALK, BRAF, EGFR, KRAS, MET, RET, ROS1: Negative -RB1, TP53: Positive, MSI-Stable, PDL1: Negative, 0%, TMB: low FoundationOne: CHEK2, SOX2 amplification, ASXL1, RB1,TP53 -08/23/2023: Gaurdant 360: ERBB2 amplification present, CHEK2 present, other alterations: TP 53 and RB1  -09/20/2023-current: Carboplatin , ipilimumab , nivolumab ,  and paclitaxel  -11/15/2023: PET: Mixed response of mediastinal and hilar adenopathy with partial improvement but some new/enlarging hypermetabolic nodes, including a new right eccentric subcarinal node and a new  right infrahilar node. Improved right upper lobe mass. New hypermetabolic hazy subsolid and ground-glass pulmonary opacities in both lungs demonstrating moderate accentuated metabolic activity, which may reflect therapy-related change, atypical infection, or lymphangitic spread. Mildly increased metabolic activity in small portacaval lymph nodes; indeterminate and may warrant surveillance. -12/27/2023: PET: Further decrease in size of right upper lobe primary. Improvement and resolution in hypermetabolic thoracic nodes which are nonspecific. Progressive diffuse pulmonary parenchymal hypermetabolism in the setting of increased ground glass and architectural distortion. Favor immunotherapy induced pneumonitis and possible developing fibrosis. No new or progressive disease. Hypermetabolism about the vaginal introitus is favored to be due to urinary contamination.   Current Treatment: Carboplatin  + paclitaxel + ipilimumab  +nivolumab   INTERVAL HISTORY:  Heather [name] is here today for follow up. Patient is accompanied by her husband.   Heather [name]'s blistering and severe itching rash has significantly improved since her last visit. She was prescribed a steroid cream by her PCP as well as EMLA  gel. She was also given a 10 day course of Prednisone  10 mg, which she completed today. Her itching was significant enough that she presented to the ED and was also given a steroid injection.   We discussed PET results,which showed improvement in her cancer as well as pneumonitis. She denies any SOB or respiratory symptoms.   Her dry mouth is minimally better, though she states cevimeline  was not effective. She has tried biotene toothpaste and other mouthwashes that have somewhat improved symptoms. Her dry mouth is most noticeably improved at night.   Jayleana is not an active smoker and quit on 08/08/2023.  I have reviewed the past medical history, past surgical history, social history and family history with the patient and  they are unchanged from previous note.  ALLERGIES:  has no known allergies.  MEDICATIONS:  Current Outpatient Medications  Medication Sig Dispense  Refill   Ascorbic Acid (VITAMIN C) 100 MG tablet Take 100 mg by mouth daily.     aspirin  EC 81 MG tablet Take 1 tablet (81 mg total) by mouth daily. Swallow whole.     atorvastatin  (LIPITOR) 40 MG tablet Take 1 tablet (40 mg total) by mouth daily. 100 tablet 3   busPIRone  (BUSPAR ) 30 MG tablet Take 1 tablet (30 mg total) by mouth in the morning and at bedtime. 200 tablet 3   cevimeline  (EVOXAC ) 30 MG capsule Take 1 capsule (30 mg total) by mouth 3 (three) times daily. (Patient not taking: Reported on 12/17/2023) 90 capsule 0   Cholecalciferol (VITAMIN D -3) 25 MCG (1000 UT) CAPS Take by mouth.     Clindamycin  Phos, Once-Daily, (CLINDAGEL) 1 % GEL Apply 1 Application topically daily. To affected areas x10-14 days 75 mL 0   dexamethasone  (DECADRON ) 4 MG tablet Take 2 tablets (8mg ) by mouth daily starting the day after carboplatin  for 3 days. Take with food 30 tablet 1   diclofenac  Sodium (VOLTAREN ) 1 % GEL Apply 1 Application topically as needed (pain). (Patient not taking: Reported on 12/17/2023)     diphenhydrAMINE  (BENADRYL ) 25 MG tablet Take 1 tablet (25 mg total) by mouth every 6 (six) hours for 3 days. 12 tablet 0   famotidine  (PEPCID ) 20 MG tablet Take 1 tablet (20 mg total) by mouth 2 (two) times daily for 3 days. 6 tablet 0   FLUoxetine  (PROZAC ) 40 MG capsule Take 1 capsule (40 mg total) by mouth daily. 100 capsule 3   hydrOXYzine  (ATARAX ) 25 MG tablet Take 1 tablet (25 mg total) by mouth every 6 (six) hours. 12 tablet 0   levocetirizine (XYZAL ) 5 MG tablet Take 0.5-1 tablets (2.5-5 mg total) by mouth every evening. To replace benadryl  for itching 90 tablet 3   levothyroxine  (SYNTHROID ) 112 MCG tablet Take 1 tablet (112 mcg total) by mouth daily before breakfast. 100 tablet 3   lidocaine -prilocaine  (EMLA ) cream Apply to affected area once 30 g 3    magic mouthwash w/lidocaine  SOLN Take 5 mLs by mouth 4 (four) times daily as needed for mouth pain. Suspension contains equal amounts of Maalox Extra Strength, nystatin, diphenhydramine  and lidocaine . 250 mL 0   omeprazole  (PRILOSEC) 40 MG capsule Take 1 capsule (40 mg total) by mouth daily. 100 capsule 3   ondansetron  (ZOFRAN ) 8 MG tablet Take 1 tablet (8 mg total) by mouth every 8 (eight) hours as needed for nausea or vomiting. Start on the third day after carboplatin . 30 tablet 1   predniSONE  (STERAPRED UNI-PAK 21 TAB) 10 MG (21) TBPK tablet Take by mouth daily. Take 6 tabs by mouth daily  for 2 days, then 5 tabs for 2 days, then 4 tabs for 2 days, then 3 tabs for 2 days, 2 tabs for 2 days, then 1 tab by mouth daily for 2 days 42 tablet 0   prochlorperazine  (COMPAZINE ) 10 MG tablet Take 1 tablet (10 mg total) by mouth every 6 (six) hours as needed for nausea or vomiting. 30 tablet 1   triamcinolone  cream (KENALOG ) 0.1 % Apply 1 Application topically 2 (two) times daily. X7-10 days to patch on left side of face. (Patient not taking: Reported on 12/17/2023) 30 g 0   valACYclovir  (VALTREX ) 1000 MG tablet TAKE 2 TABLETS BY MOUTH TWICE DAILY FOR 1 DAY AT FEVER BLISTER ONSET 20 tablet 2   No current facility-administered medications for this visit.    REVIEW OF SYSTEMS:  Constitutional: Denies fevers, chills or abnormal weight loss Eyes: Denies blurriness of vision Ears, nose, mouth, throat, and face: Denies mucositis or sore throat Respiratory: Denies cough, dyspnea or wheezes Cardiovascular: Denies palpitation, chest discomfort or lower extremity swelling Gastrointestinal:  Denies nausea, heartburn or change in bowel habits Skin: Denies abnormal skin rashes Lymphatics: Denies new lymphadenopathy or easy bruising Neurological:Denies numbness, tingling or new weaknesses Behavioral/Psych: Mood is stable, no new changes  All other systems were reviewed with the patient and are  negative.   VITALS:  There were no vitals taken for this visit.  Wt Readings from Last 3 Encounters:  12/23/23 165 lb (74.8 kg)  12/17/23 165 lb 2 oz (74.9 kg)  12/12/23 165 lb 5.5 oz (75 kg)    There is no height or weight on file to calculate BMI.  Performance status (ECOG): 1 - Symptomatic but completely ambulatory  PHYSICAL EXAM:   GENERAL:alert, no distress and comfortable SKIN: Erythematous papular rash very sparsely on the chest area. EYES: normal, Conjunctiva are pink and non-injected, sclera clear LUNGS: clear to auscultation and percussion with normal breathing effort HEART: regular rate & rhythm and no murmurs and no lower extremity edema ABDOMEN:abdomen soft, non-tender and normal bowel sounds Musculoskeletal:no cyanosis of digits and no clubbing  NEURO: alert & oriented x 3 with fluent speech  LABORATORY DATA:  I have reviewed the data as listed    Component Value Date/Time   NA 136 12/23/2023 1200   NA 138 06/15/2023 0934   K 3.8 12/23/2023 1200   CL 104 12/23/2023 1200   CO2 24 12/23/2023 1200   GLUCOSE 114 (H) 12/23/2023 1200   BUN 12 12/23/2023 1200   BUN 11 06/15/2023 0934   CREATININE 0.68 12/23/2023 1200   CALCIUM  9.5 12/23/2023 1200   PROT 7.0 12/23/2023 1200   PROT 6.9 06/15/2023 0934   ALBUMIN 3.3 (L) 12/23/2023 1200   ALBUMIN 4.4 06/15/2023 0934   AST 21 12/23/2023 1200   ALT 13 12/23/2023 1200   ALKPHOS 62 12/23/2023 1200   BILITOT 0.8 12/23/2023 1200   BILITOT 0.4 06/15/2023 0934   GFRNONAA >60 12/23/2023 1200   GFRAA 105 01/21/2020 1615    Lab Results  Component Value Date   WBC 8.4 12/23/2023   NEUTROABS 5.2 12/23/2023   HGB 12.0 12/23/2023   HCT 36.0 12/23/2023   MCV 88.0 12/23/2023   PLT 416 (H) 12/23/2023      Chemistry      Component Value Date/Time   NA 136 12/23/2023 1200   NA 138 06/15/2023 0934   K 3.8 12/23/2023 1200   CL 104 12/23/2023 1200   CO2 24 12/23/2023 1200   BUN 12 12/23/2023 1200   BUN 11 06/15/2023  0934   CREATININE 0.68 12/23/2023 1200      Component Value Date/Time   CALCIUM  9.5 12/23/2023 1200   ALKPHOS 62 12/23/2023 1200   AST 21 12/23/2023 1200   ALT 13 12/23/2023 1200   BILITOT 0.8 12/23/2023 1200   BILITOT 0.4 06/15/2023 0934      Latest Reference Range & Units 10/31/23 12:05  TSH 0.350 - 4.500 uIU/mL 3.190  Thyroxine (T4) 4.5 - 12.0 ug/dL 88.7    RADIOGRAPHIC STUDIES: I have personally reviewed the radiological images as listed and agreed with the findings in the report.  NM PET Image Restag (PS) Skull Base To Thigh EXAM: PET AND CT SKULL BASE TO MID THIGH 12/27/2023 02:57:04 PM  TECHNIQUE:  RADIOPHARMACEUTICAL: 8.55 mCi F-18 FDG Uptake  time 60 minutes. Glucose level 105 mg/dl. Blood pool SUV 2.5. PET imaging was acquired from the base of the skull to the mid thighs. Non-contrast enhanced computed tomography was obtained for attenuation correction and anatomic localization.  COMPARISON: 11/15/2023  CLINICAL HISTORY: Non-small cell lung cancer, restaging.  FINDINGS:  HEAD AND NECK: No hypermetabolic cervical lymph nodes are identified. Bilateral carotid atherosclerosis.  CHEST: Improvement and resolution of previously described hypermetabolic thoracic nodes. A node within the azygoesophageal recess measures 5 mm and SUV 3.4 on image 58 / 202. Compare 8 mm and SUV 6.5 on the prior exam. Relatively diffuse pulmonary parenchymal hypermetabolism in the setting of increased ground glass, architectural distortion. The centrally cavitary right upper lobe lung lesion measures 2.3 x 0.9 cm on image 43 / 202. 2.4 x 2.2 cm on the prior exam. Difficult to evaluate for activity in this area, given diffuse pulmonary parenchymal metabolism. Estimated SUV 3.3 today versus SUV 7.6 on the prior. Right port-a-cath tip at high right atrium. Aortic and coronary artery calcifications.  ABDOMEN AND PELVIS: There is no hypermetabolic activity within the liver,  adrenal glands, spleen or pancreas. No residual porta hepatis nodal hypermetabolism. New hypermetabolism about the vaginal introitus is favored to represent urinary contamination, without CT correlate. 11.6 SUV on image 146 of series 202. Infrarenal abdominal aortic atherosclerosis. Nonaneurysmal dilatation of the infrarenal segment at 2.7 cm similar. Circumaortic left renal vein. Hysterectomy. Physiologic activity within the gastrointestinal and genitourinary systems.  BONES AND SOFT TISSUE: There is no hypermetabolic activity to suggest osseous metastatic disease.  IMPRESSION: 1. Further decrease in size of right upper lobe primary. 2. Improvement and resolution in hypermetabolic thoracic nodes which are nonspecific. 3. Progressive diffuse pulmonary parenchymal hypermetabolism in the setting of increased ground glass and architectural distortion. Favor immunotherapy induced pneumonitis and possible developing fibrosis. 4. No new or progressive disease. 5. Hypermetabolism about the vaginal introitus is favored to be due to urinary contamination. Recommend attention on follow up. 6. Aortic atherosclerosis (ICD10-I70.0). Coronary artery atherosclerosis.  Electronically signed by: Rockey Kilts MD 12/31/2023 02:29 PM EST RP Workstation: [id number]

## 2024-01-03 NOTE — Patient Instructions (Signed)
 [facility] [place] - A DEPT OF [facility]  Discharge Instructions: Thank you for choosing [facility] to provide your oncology and hematology care.  If you have a lab appointment with the Cancer Center - please note that after April 8th, 2024, all labs will be drawn in the cancer center.  You do not have to check in or register with the main entrance as you have in the past but will complete your check-in in the cancer center.  Wear comfortable clothing and clothing appropriate for easy access to any Portacath or PICC line.   We strive to give you quality time with your provider. You may need to reschedule your appointment if you arrive late (15 or more minutes).  Arriving late affects you and other patients whose appointments are after yours.  Also, if you miss three or more appointments without notifying the office, you may be dismissed from the clinic at the provider's discretion.      For prescription refill requests, have your pharmacy contact our office and allow 72 hours for refills to be completed.    Today you received the following chemotherapy and/or immunotherapy agents Opdivo   To help prevent nausea and vomiting after your treatment, we encourage you to take your nausea medication as directed.  Nivolumab Injection What is this medication? NIVOLUMAB (nye VOL ue mab) treats some types of cancer. It works by helping your immune system slow or stop the spread of cancer cells. It is a monoclonal antibody. This medicine may be used for other purposes; ask your health care provider or pharmacist if you have questions. COMMON BRAND NAME(S): Opdivo What should I tell my care team before I take this medication? They need to know if you have any of these conditions: Allogeneic stem cell transplant (uses someone else's stem cells) Autoimmune diseases, such as Crohn disease, ulcerative colitis, lupus History of chest radiation Nervous system problems,  such as Guillain-Barre syndrome or myasthenia gravis Organ transplant An unusual or allergic reaction to nivolumab, other medications, foods, dyes, or preservatives Pregnant or trying to get pregnant Breast-feeding How should I use this medication? This medication is infused into a vein. It is given in a hospital or clinic setting. A special MedGuide will be given to you before each treatment. Be sure to read this information carefully each time. Talk to your care team about the use of this medication in children. While it may be prescribed for children as young as 12 years for selected conditions, precautions do apply. Overdosage: If you think you have taken too much of this medicine contact a poison control center or emergency room at once. NOTE: This medicine is only for you. Do not share this medicine with others. What if I miss a dose? Keep appointments for follow-up doses. It is important not to miss your dose. Call your care team if you are unable to keep an appointment. What may interact with this medication? Interactions have not been studied. This list may not describe all possible interactions. Give your health care provider a list of all the medicines, herbs, non-prescription drugs, or dietary supplements you use. Also tell them if you smoke, drink alcohol, or use illegal drugs. Some items may interact with your medicine. What should I watch for while using this medication? Your condition will be monitored carefully while you are receiving this medication. You may need blood work while taking this medication. This medication may cause serious skin reactions. They  can happen weeks to months after starting the medication. Contact your care team right away if you notice fevers or flu-like symptoms with a rash. The rash may be red or purple and then turn into blisters or peeling of the skin. You may also notice a red rash with swelling of the face, lips, or lymph nodes in your neck or under  your arms. Tell your care team right away if you have any change in your eyesight. Talk to your care team if you are pregnant or think you might be pregnant. A negative pregnancy test is required before starting this medication. A reliable form of contraception is recommended while taking this medication and for 5 months after the last dose. Talk to your care team about effective forms of contraception. Do not breast-feed while taking this medication and for 5 months after the last dose. What side effects may I notice from receiving this medication? Side effects that you should report to your care team as soon as possible: Allergic reactions--skin rash, itching, hives, swelling of the face, lips, tongue, or throat Dry cough, shortness of breath or trouble breathing Eye pain, redness, irritation, or discharge with blurry or decreased vision Heart muscle inflammation--unusual weakness or fatigue, shortness of breath, chest pain, fast or irregular heartbeat, dizziness, swelling of the ankles, feet, or hands Hormone gland problems--headache, sensitivity to light, unusual weakness or fatigue, dizziness, fast or irregular heartbeat, increased sensitivity to cold or heat, excessive sweating, constipation, hair loss, increased thirst or amount of urine, tremors or shaking, irritability Infusion reactions--chest pain, shortness of breath or trouble breathing, feeling faint or lightheaded Kidney injury (glomerulonephritis)--decrease in the amount of urine, red or dark brown urine, foamy or bubbly urine, swelling of the ankles, hands, or feet Liver injury--right upper belly pain, loss of appetite, nausea, light-colored stool, dark yellow or brown urine, yellowing skin or eyes, unusual weakness or fatigue Pain, tingling, or numbness in the hands or feet, muscle weakness, change in vision, confusion or trouble speaking, loss of balance or coordination, trouble walking, seizures Rash, fever, and swollen lymph  nodes Redness, blistering, peeling, or loosening of the skin, including inside the mouth Sudden or severe stomach pain, bloody diarrhea, fever, nausea, vomiting Side effects that usually do not require medical attention (report these to your care team if they continue or are bothersome): Bone, joint, or muscle pain Diarrhea Fatigue Loss of appetite Nausea Skin rash This list may not describe all possible side effects. Call your doctor for medical advice about side effects. You may report side effects to FDA at 1-800-FDA-1088. Where should I keep my medication? This medication is given in a hospital or clinic. It will not be stored at home. NOTE: This sheet is a summary. It may not cover all possible information. If you have questions about this medicine, talk to your doctor, pharmacist, or health care provider.  2024 Elsevier/Gold Standard (2021-05-02 00:00:00)   BELOW ARE SYMPTOMS THAT SHOULD BE REPORTED IMMEDIATELY: *FEVER GREATER THAN 100.4 F (38 C) OR HIGHER *CHILLS OR SWEATING *NAUSEA AND VOMITING THAT IS NOT CONTROLLED WITH YOUR NAUSEA MEDICATION *UNUSUAL SHORTNESS OF BREATH *UNUSUAL BRUISING OR BLEEDING *URINARY PROBLEMS (pain or burning when urinating, or frequent urination) *BOWEL PROBLEMS (unusual diarrhea, constipation, pain near the anus) TENDERNESS IN MOUTH AND THROAT WITH OR WITHOUT PRESENCE OF ULCERS (sore throat, sores in mouth, or a toothache) UNUSUAL RASH, SWELLING OR PAIN  UNUSUAL VAGINAL DISCHARGE OR ITCHING   Items with * indicate a potential emergency and should be  followed up as soon as possible or go to the Emergency Department if any problems should occur.  Please show the CHEMOTHERAPY ALERT CARD or IMMUNOTHERAPY ALERT CARD at check-in to the Emergency Department and triage nurse.  Should you have questions after your visit or need to cancel or reschedule your appointment, please contact [facility] [place] - A DEPT OF [facility] Brideg[facility]  [phone]  and follow the prompts.  Office hours are 8:00 a.m. to 4:30 p.m. Monday - Friday. Please note that voicemails left after 4:00 p.m. may not be returned until the following business day.  We are closed weekends and major holidays. You have access to a nurse at all times for urgent questions. Please call the main number to the clinic [phone] and follow the prompts.  For any non-urgent questions, you may also contact your provider using MyChart. We now offer e-Visits for anyone 27 and older to request care online for non-urgent symptoms. For details visit [email or web address].   Also download the MyChart app! Go to the app store, search "MyChart", open the app, select [place], and log in with your MyChart username and password.

## 2024-01-04 ENCOUNTER — Encounter: Payer: Self-pay | Admitting: Oncology

## 2024-01-08 ENCOUNTER — Encounter: Payer: Self-pay | Admitting: Oncology

## 2024-01-08 ENCOUNTER — Telehealth: Payer: Self-pay | Admitting: Family Medicine

## 2024-01-08 NOTE — Telephone Encounter (Signed)
 Left message that June 1 appointment was changed to June 2 due to appointment being scheduled wrong.

## 2024-01-09 ENCOUNTER — Other Ambulatory Visit: Payer: Self-pay | Admitting: *Deleted

## 2024-01-09 ENCOUNTER — Telehealth: Payer: Self-pay | Admitting: *Deleted

## 2024-01-09 MED ORDER — PREDNISONE 10 MG PO TABS: 10.0000 mg | ORAL_TABLET | Freq: Every day | ORAL | 0 refills | Status: DC

## 2024-01-09 NOTE — Telephone Encounter (Signed)
 Patient went to dermatology and was given clobetasol cream and it helped initially, however she has broken out again on her entire trunk since last treatment.  Per Dr. Davonna, will send in prednisone  10 mg daily x 30 days.  Patient aware and verbalized understanding.

## 2024-01-14 ENCOUNTER — Encounter: Payer: Self-pay | Admitting: *Deleted

## 2024-01-17 ENCOUNTER — Other Ambulatory Visit: Payer: Self-pay | Admitting: Oncology

## 2024-01-17 DIAGNOSIS — C3491 Malignant neoplasm of unspecified part of right bronchus or lung: Secondary | ICD-10-CM

## 2024-01-21 ENCOUNTER — Encounter: Payer: Self-pay | Admitting: Oncology

## 2024-01-24 ENCOUNTER — Inpatient Hospital Stay: Payer: Self-pay

## 2024-01-24 ENCOUNTER — Inpatient Hospital Stay: Payer: Self-pay | Attending: Oncology

## 2024-01-24 VITALS — BP 138/60 | HR 64 | Temp 96.7°F | Resp 16

## 2024-01-24 DIAGNOSIS — C3411 Malignant neoplasm of upper lobe, right bronchus or lung: Secondary | ICD-10-CM | POA: Diagnosis not present

## 2024-01-24 DIAGNOSIS — C3491 Malignant neoplasm of unspecified part of right bronchus or lung: Secondary | ICD-10-CM

## 2024-01-24 DIAGNOSIS — Z79620 Long term (current) use of immunosuppressive biologic: Secondary | ICD-10-CM | POA: Diagnosis not present

## 2024-01-24 DIAGNOSIS — Z5112 Encounter for antineoplastic immunotherapy: Secondary | ICD-10-CM | POA: Diagnosis present

## 2024-01-24 DIAGNOSIS — Z87891 Personal history of nicotine dependence: Secondary | ICD-10-CM | POA: Insufficient documentation

## 2024-01-24 DIAGNOSIS — C778 Secondary and unspecified malignant neoplasm of lymph nodes of multiple regions: Secondary | ICD-10-CM | POA: Insufficient documentation

## 2024-01-24 LAB — CBC WITH DIFFERENTIAL/PLATELET
Abs Immature Granulocytes: 0.04 K/uL (ref 0.00–0.07)
Basophils Absolute: 0 K/uL (ref 0.0–0.1)
Basophils Relative: 0 %
Eosinophils Absolute: 0.2 K/uL (ref 0.0–0.5)
Eosinophils Relative: 2 %
HCT: 38.4 % (ref 36.0–46.0)
Hemoglobin: 12.7 g/dL (ref 12.0–15.0)
Immature Granulocytes: 0 %
Lymphocytes Relative: 25 %
Lymphs Abs: 2.7 K/uL (ref 0.7–4.0)
MCH: 30 pg (ref 26.0–34.0)
MCHC: 33.1 g/dL (ref 30.0–36.0)
MCV: 90.6 fL (ref 80.0–100.0)
Monocytes Absolute: 1.2 K/uL — ABNORMAL HIGH (ref 0.1–1.0)
Monocytes Relative: 11 %
Neutro Abs: 6.6 K/uL (ref 1.7–7.7)
Neutrophils Relative %: 62 %
Platelets: 368 K/uL (ref 150–400)
RBC: 4.24 MIL/uL (ref 3.87–5.11)
RDW: 13.7 % (ref 11.5–15.5)
WBC: 10.8 K/uL — ABNORMAL HIGH (ref 4.0–10.5)
nRBC: 0 % (ref 0.0–0.2)

## 2024-01-24 LAB — COMPREHENSIVE METABOLIC PANEL WITH GFR
ALT: 19 U/L (ref 0–44)
AST: 19 U/L (ref 15–41)
Albumin: 4.3 g/dL (ref 3.5–5.0)
Alkaline Phosphatase: 67 U/L (ref 38–126)
Anion gap: 15 (ref 5–15)
BUN: 17 mg/dL (ref 8–23)
CO2: 20 mmol/L — ABNORMAL LOW (ref 22–32)
Calcium: 9.7 mg/dL (ref 8.9–10.3)
Chloride: 102 mmol/L (ref 98–111)
Creatinine, Ser: 0.72 mg/dL (ref 0.44–1.00)
GFR, Estimated: >60 mL/min
Glucose, Bld: 83 mg/dL (ref 70–99)
Potassium: 4.1 mmol/L (ref 3.5–5.1)
Sodium: 138 mmol/L (ref 135–145)
Total Bilirubin: 0.4 mg/dL (ref 0.0–1.2)
Total Protein: 7.1 g/dL (ref 6.5–8.1)

## 2024-01-24 LAB — MAGNESIUM: Magnesium: 1.8 mg/dL (ref 1.7–2.4)

## 2024-01-24 LAB — TSH: TSH: 4.98 u[IU]/mL — ABNORMAL HIGH (ref 0.350–4.500)

## 2024-01-24 MED ORDER — SODIUM CHLORIDE 0.9 % IV SOLN
360.0000 mg | Freq: Once | INTRAVENOUS | Status: AC
  Administered 2024-01-24: 360 mg via INTRAVENOUS
  Filled 2024-01-24: qty 24

## 2024-01-24 MED ORDER — DIPHENHYDRAMINE HCL 50 MG/ML IJ SOLN
25.0000 mg | Freq: Once | INTRAMUSCULAR | Status: AC
  Administered 2024-01-24: 25 mg via INTRAVENOUS
  Filled 2024-01-24: qty 1

## 2024-01-24 MED ORDER — FAMOTIDINE IN NACL 20-0.9 MG/50ML-% IV SOLN
20.0000 mg | Freq: Once | INTRAVENOUS | Status: AC
  Administered 2024-01-24: 20 mg via INTRAVENOUS
  Filled 2024-01-24: qty 50

## 2024-01-24 MED ORDER — SODIUM CHLORIDE 0.9 % IV SOLN
1.0000 mg/kg | Freq: Once | INTRAVENOUS | Status: AC
  Administered 2024-01-24: 80 mg via INTRAVENOUS
  Filled 2024-01-24: qty 16

## 2024-01-24 MED ORDER — SODIUM CHLORIDE 0.9 % IV SOLN: INTRAVENOUS | Status: DC

## 2024-01-24 NOTE — Progress Notes (Signed)
Treatment given per orders. Patient tolerated it well without problems. Vitals stable and discharged home from clinic ambulatory. Follow up as scheduled.  

## 2024-01-24 NOTE — Patient Instructions (Signed)
 [facility] [place] - A DEPT OF [facility]  Discharge Instructions: Thank you for choosing [facility] to provide your oncology and hematology care.  If you have a lab appointment with the Cancer Center - please note that after April 8th, 2024, all labs will be drawn in the cancer center.  You do not have to check in or register with the main entrance as you have in the past but will complete your check-in in the cancer center.  Wear comfortable clothing and clothing appropriate for easy access to any Portacath or PICC line.   We strive to give you quality time with your provider. You may need to reschedule your appointment if you arrive late (15 or more minutes).  Arriving late affects you and other patients whose appointments are after yours.  Also, if you miss three or more appointments without notifying the office, you may be dismissed from the clinic at the providers discretion.      For prescription refill requests, have your pharmacy contact our office and allow 72 hours for refills to be completed.    Today you received the following chemotherapy and/or immunotherapy agents Opdivo , Yervoy .  Nivolumab  Injection What is this medication? NIVOLUMAB  (nye VOL ue mab) treats some types of cancer. It works by helping your immune system slow or stop the spread of cancer cells. It is a monoclonal antibody. This medicine may be used for other purposes; ask your health care provider or pharmacist if you have questions. COMMON BRAND NAME(S): Opdivo  What should I tell my care team before I take this medication? They need to know if you have any of these conditions: Allogeneic stem cell transplant (uses someone else's stem cells) Autoimmune diseases, such as Crohn disease, ulcerative colitis, lupus History of chest radiation Nervous system problems, such as Guillain-Barre syndrome or myasthenia gravis Organ transplant An unusual or allergic reaction to  nivolumab , other medications, foods, dyes, or preservatives Pregnant or trying to get pregnant Breast-feeding How should I use this medication? This medication is infused into a vein. It is given in a hospital or clinic setting. A special MedGuide will be given to you before each treatment. Be sure to read this information carefully each time. Talk to your care team about the use of this medication in children. While it may be prescribed for children as young as 12 years for selected conditions, precautions do apply. Overdosage: If you think you have taken too much of this medicine contact a poison control center or emergency room at once. NOTE: This medicine is only for you. Do not share this medicine with others. What if I miss a dose? Keep appointments for follow-up doses. It is important not to miss your dose. Call your care team if you are unable to keep an appointment. What may interact with this medication? Interactions have not been studied. This list may not describe all possible interactions. Give your health care provider a list of all the medicines, herbs, non-prescription drugs, or dietary supplements you use. Also tell them if you smoke, drink alcohol, or use illegal drugs. Some items may interact with your medicine. What should I watch for while using this medication? Your condition will be monitored carefully while you are receiving this medication. You may need blood work while taking this medication. This medication may cause serious skin reactions. They can happen weeks to months after starting the medication. Contact your care team right away if you notice fevers or  flu-like symptoms with a rash. The rash may be red or purple and then turn into blisters or peeling of the skin. You may also notice a red rash with swelling of the face, lips, or lymph nodes in your neck or under your arms. Tell your care team right away if you have any change in your eyesight. Talk to your care  team if you are pregnant or think you might be pregnant. A negative pregnancy test is required before starting this medication. A reliable form of contraception is recommended while taking this medication and for 5 months after the last dose. Talk to your care team about effective forms of contraception. Do not breast-feed while taking this medication and for 5 months after the last dose. What side effects may I notice from receiving this medication? Side effects that you should report to your care team as soon as possible: Allergic reactions--skin rash, itching, hives, swelling of the face, lips, tongue, or throat Dry cough, shortness of breath or trouble breathing Eye pain, redness, irritation, or discharge with blurry or decreased vision Heart muscle inflammation--unusual weakness or fatigue, shortness of breath, chest pain, fast or irregular heartbeat, dizziness, swelling of the ankles, feet, or hands Hormone gland problems--headache, sensitivity to light, unusual weakness or fatigue, dizziness, fast or irregular heartbeat, increased sensitivity to cold or heat, excessive sweating, constipation, hair loss, increased thirst or amount of urine, tremors or shaking, irritability Infusion reactions--chest pain, shortness of breath or trouble breathing, feeling faint or lightheaded Kidney injury (glomerulonephritis)--decrease in the amount of urine, red or dark brown urine, foamy or bubbly urine, swelling of the ankles, hands, or feet Liver injury--right upper belly pain, loss of appetite, nausea, light-colored stool, dark yellow or brown urine, yellowing skin or eyes, unusual weakness or fatigue Pain, tingling, or numbness in the hands or feet, muscle weakness, change in vision, confusion or trouble speaking, loss of balance or coordination, trouble walking, seizures Rash, fever, and swollen lymph nodes Redness, blistering, peeling, or loosening of the skin, including inside the mouth Sudden or severe  stomach pain, bloody diarrhea, fever, nausea, vomiting Side effects that usually do not require medical attention (report these to your care team if they continue or are bothersome): Bone, joint, or muscle pain Diarrhea Fatigue Loss of appetite Nausea Skin rash This list may not describe all possible side effects. Call your doctor for medical advice about side effects. You may report side effects to FDA at 1-800-FDA-1088. Where should I keep my medication? This medication is given in a hospital or clinic. It will not be stored at home. NOTE: This sheet is a summary. It may not cover all possible information. If you have questions about this medicine, talk to your doctor, pharmacist, or health care provider.  2024 Elsevier/Gold Standard (2021-05-02 00:00:00)      To help prevent nausea and vomiting after your treatment, we encourage you to take your nausea medication as directed.  BELOW ARE SYMPTOMS THAT SHOULD BE REPORTED IMMEDIATELY: *FEVER GREATER THAN 100.4 F (38 C) OR HIGHER *CHILLS OR SWEATING *NAUSEA AND VOMITING THAT IS NOT CONTROLLED WITH YOUR NAUSEA MEDICATION *UNUSUAL SHORTNESS OF BREATH *UNUSUAL BRUISING OR BLEEDING *URINARY PROBLEMS (pain or burning when urinating, or frequent urination) *BOWEL PROBLEMS (unusual diarrhea, constipation, pain near the anus) TENDERNESS IN MOUTH AND THROAT WITH OR WITHOUT PRESENCE OF ULCERS (sore throat, sores in mouth, or a toothache) UNUSUAL RASH, SWELLING OR PAIN  UNUSUAL VAGINAL DISCHARGE OR ITCHING   Items with * indicate a potential emergency  and should be followed up as soon as possible or go to the Emergency Department if any problems should occur.  Please show the CHEMOTHERAPY ALERT CARD or IMMUNOTHERAPY ALERT CARD at check-in to the Emergency Department and triage nurse.  Should you have questions after your visit or need to cancel or reschedule your appointment, please contact [facility] [place] - A DEPT OF [facility] [phone]  and follow the prompts.  Office hours are 8:00 a.m. to 4:30 p.m. Monday - Friday. Please note that voicemails left after 4:00 p.m. may not be returned until the following business day.  We are closed weekends and major holidays. You have access to a nurse at all times for urgent questions. Please call the main number to the clinic [phone] and follow the prompts.  For any non-urgent questions, you may also contact your provider using MyChart. We now offer e-Visits for anyone 33 and older to request care online for non-urgent symptoms. For details visit [email or web address].   Also download the MyChart app! Go to the app store, search MyChart, open the app, select [place], and log in with your MyChart username and password.

## 2024-01-24 NOTE — Progress Notes (Signed)
 Dr. Davonna at the bedside. Rash assessed. Verbal orders to proceed with treatment WITHOUT CMP results. CBC within parameters for treatment. Vital signs within parameters for treatment.   Ipilimumab  (YERVOY ) Patient Monitoring Assessment   Is the patient experiencing any of the following general symptoms?:  [x ]Difficulty performing normal activities [x ]Feeling sluggish or cold all the time [ x]Unusual weight gain [ x]Constant or unusual headaches [ x]Feeling dizzy or faint [ x]Changes in eyesight (blurry vision, double vision, or other vision problems) [ x]Changes in mood or behavior (ex: decreased sex drive, irritability, or forgetfulness) [ x]Starting new medications (ex: steroids, other medications that lower immune response) [x ]Patient is not experiencing any of the general symptoms above.   Gastrointestinal  Patient is having 1 bowel movements each day.  Is this different from baseline? [ ] Yes [x ]No Are your stools watery or do they have a foul smell? [ ] Yes [x ]No Have you seen blood in your stools? [ ] Yes [ x]No Are your stools dark, tarry, or sticky? [ ] Yes [ x]No Are you having pain or tenderness in your belly? [ ] Yes [ x]No  Skin Does your skin itch? [ x]Yes [ ] No Do you have a rash? [ x]Yes [ ] No Has your skin blistered and/or peeled? [ x]Yes [ ] No Do you have sores in your mouth? [ ] Yes [ x]No  Hepatic Has your urine been dark or tea colored? [ ] Yes [x ]No Have you noticed that your skin or the whites of your eyes are turning yellow? [ ] Yes [ x]No Are you bleeding or bruising more easily than normal? [ ] Yes [ x]No Are you nauseous and/or vomiting? [ ] Yes [ x]No Do you have pain on the right side of your stomach? [ ] Yes [x ]No  Neurologic  Are you having unusual weakness of legs, arms, or face? [ ] Yes [ x]No Are you having numbness or tingling in your hands or feet? [ ] Yes [ x]No  Leotis BIRCH Kanylah Muench

## 2024-01-25 LAB — T4: T4, Total: 7.4 ug/dL (ref 4.5–12.0)

## 2024-01-28 ENCOUNTER — Telehealth: Payer: Self-pay | Admitting: Dietician

## 2024-01-28 ENCOUNTER — Inpatient Hospital Stay: Payer: Self-pay | Attending: Oncology | Admitting: Dietician

## 2024-01-28 NOTE — Telephone Encounter (Signed)
 Nutrition Follow-up:  Pt with stage IV NSCLC with contralateral lymphadenopathy. She is receiving chemoimmunotherapy with carboplatin /taxol  + opdivo  q42d. Patient is under the care of Dr. Davonna.   Spoke with patient via telephone. She reports rash is drying up. Her skin looks terrible, but glad to be healing. Patient seen by dermatology. 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 She is taking daily prednisone . Reports tolerating last treatment without side effects.  Appetite is good. Food is tasting good again and gaining weight. Reports dry mouth. Drinking 3-4 bottles water . This is the only fluid that quenches thirst.   Medications: reviewed   Labs: reviewed   Anthropometrics: Last wt 166 lb 0.1 oz on 1/8 - increased   12/18 - 163 lb 12.8 oz  12/1 - 165 lb 2 oz  11/6 - 169 lb 12.1 oz    NUTRITION DIAGNOSIS: Food and nutrition related knowledge deficit improved    INTERVENTION:  Continue including good sources of protein at every meal to support healing    MONITORING, EVALUATION, GOAL: wt trends, intake    NEXT VISIT: To be scheduled as needed

## 2024-02-05 ENCOUNTER — Other Ambulatory Visit: Payer: Self-pay | Admitting: Oncology

## 2024-02-14 ENCOUNTER — Inpatient Hospital Stay: Payer: Self-pay | Admitting: Oncology

## 2024-02-14 ENCOUNTER — Inpatient Hospital Stay: Payer: Self-pay

## 2024-02-14 VITALS — BP 139/73 | HR 74 | Temp 96.5°F | Resp 18

## 2024-02-14 DIAGNOSIS — R21 Rash and other nonspecific skin eruption: Secondary | ICD-10-CM

## 2024-02-14 DIAGNOSIS — J984 Other disorders of lung: Secondary | ICD-10-CM | POA: Diagnosis not present

## 2024-02-14 DIAGNOSIS — C3491 Malignant neoplasm of unspecified part of right bronchus or lung: Secondary | ICD-10-CM | POA: Diagnosis not present

## 2024-02-14 DIAGNOSIS — L138 Other specified bullous disorders: Secondary | ICD-10-CM

## 2024-02-14 DIAGNOSIS — F419 Anxiety disorder, unspecified: Secondary | ICD-10-CM | POA: Diagnosis not present

## 2024-02-14 DIAGNOSIS — Z5112 Encounter for antineoplastic immunotherapy: Secondary | ICD-10-CM | POA: Diagnosis not present

## 2024-02-14 DIAGNOSIS — R682 Dry mouth, unspecified: Secondary | ICD-10-CM

## 2024-02-14 LAB — CBC WITH DIFFERENTIAL/PLATELET
Abs Immature Granulocytes: 0.06 10*3/uL (ref 0.00–0.07)
Basophils Absolute: 0 10*3/uL (ref 0.0–0.1)
Basophils Relative: 0 %
Eosinophils Absolute: 0 10*3/uL (ref 0.0–0.5)
Eosinophils Relative: 0 %
HCT: 38.3 % (ref 36.0–46.0)
Hemoglobin: 12.5 g/dL (ref 12.0–15.0)
Immature Granulocytes: 0 %
Lymphocytes Relative: 9 %
Lymphs Abs: 1.4 10*3/uL (ref 0.7–4.0)
MCH: 29.1 pg (ref 26.0–34.0)
MCHC: 32.6 g/dL (ref 30.0–36.0)
MCV: 89.3 fL (ref 80.0–100.0)
Monocytes Absolute: 0.5 10*3/uL (ref 0.1–1.0)
Monocytes Relative: 3 %
Neutro Abs: 14 10*3/uL — ABNORMAL HIGH (ref 1.7–7.7)
Neutrophils Relative %: 88 %
Platelets: 337 10*3/uL (ref 150–400)
RBC: 4.29 MIL/uL (ref 3.87–5.11)
RDW: 14.4 % (ref 11.5–15.5)
WBC: 16 10*3/uL — ABNORMAL HIGH (ref 4.0–10.5)
nRBC: 0 % (ref 0.0–0.2)

## 2024-02-14 LAB — COMPREHENSIVE METABOLIC PANEL WITH GFR
ALT: 26 U/L (ref 0–44)
AST: 23 U/L (ref 15–41)
Albumin: 4.4 g/dL (ref 3.5–5.0)
Alkaline Phosphatase: 67 U/L (ref 38–126)
Anion gap: 13 (ref 5–15)
BUN: 16 mg/dL (ref 8–23)
CO2: 23 mmol/L (ref 22–32)
Calcium: 9.8 mg/dL (ref 8.9–10.3)
Chloride: 101 mmol/L (ref 98–111)
Creatinine, Ser: 0.67 mg/dL (ref 0.44–1.00)
GFR, Estimated: >60 mL/min
Glucose, Bld: 114 mg/dL — ABNORMAL HIGH (ref 70–99)
Potassium: 4.2 mmol/L (ref 3.5–5.1)
Sodium: 137 mmol/L (ref 135–145)
Total Bilirubin: 0.5 mg/dL (ref 0.0–1.2)
Total Protein: 7.2 g/dL (ref 6.5–8.1)

## 2024-02-14 LAB — MAGNESIUM: Magnesium: 2.1 mg/dL (ref 1.7–2.4)

## 2024-02-14 MED ORDER — SODIUM CHLORIDE 0.9 % IV SOLN: INTRAVENOUS | Status: DC

## 2024-02-14 MED ORDER — SODIUM CHLORIDE 0.9 % IV SOLN
360.0000 mg | Freq: Once | INTRAVENOUS | Status: AC
  Administered 2024-02-14: 360 mg via INTRAVENOUS
  Filled 2024-02-14: qty 24

## 2024-02-14 NOTE — Progress Notes (Signed)
 Patient has been examined by Dr. Davonna. Vital signs and labs have been reviewed by MD - ANC, Creatinine, LFTs, hemoglobin, and platelets have been reviewed by M.D. - pt may proceed with treatment.  Primary RN and pharmacy notified.

## 2024-02-14 NOTE — Patient Instructions (Signed)

## 2024-02-14 NOTE — Patient Instructions (Addendum)
 [facility] [place] [facility]  Discharge Instructions: Thank you for choosing [facility] to provide your oncology and hematology care.  If you have a lab appointment with the Cancer Center - please note that after April 8th, 2024, all labs will be drawn in the cancer center.  You do not have to check in or register with the main entrance as you have in the past but will complete your check-in in the cancer center.  Wear comfortable clothing and clothing appropriate for easy access to any Portacath or PICC line.   We strive to give you quality time with your provider. You may need to reschedule your appointment if you arrive late (15 or more minutes).  Arriving late affects you and other patients whose appointments are after yours.  Also, if you miss three or more appointments without notifying the office, you may be dismissed from the clinic at the provider's discretion.      For prescription refill requests, have your pharmacy contact our office and allow 72 hours for refills to be completed.    Today you received the following chemotherapy and/or immunotherapy agents Nivolumab   To help prevent nausea and vomiting after your treatment, we encourage you to take your nausea medication as directed.  BELOW ARE SYMPTOMS THAT SHOULD BE REPORTED IMMEDIATELY: *FEVER GREATER THAN 100.4 F (38 C) OR HIGHER *CHILLS OR SWEATING *NAUSEA AND VOMITING THAT IS NOT CONTROLLED WITH YOUR NAUSEA MEDICATION *UNUSUAL SHORTNESS OF BREATH *UNUSUAL BRUISING OR BLEEDING *URINARY PROBLEMS (pain or burning when urinating, or frequent urination) *BOWEL PROBLEMS (unusual diarrhea, constipation, pain near the anus) TENDERNESS IN MOUTH AND THROAT WITH OR WITHOUT PRESENCE OF ULCERS (sore throat, sores in mouth, or a toothache) UNUSUAL RASH, SWELLING OR PAIN  UNUSUAL VAGINAL DISCHARGE OR ITCHING   Items with * indicate a potential emergency and should be followed up as  soon as possible or go to the Emergency Department if any problems should occur.  Please show the CHEMOTHERAPY ALERT CARD or IMMUNOTHERAPY ALERT CARD at check-in to the Emergency Department and triage nurse.  Should you have questions after your visit or need to cancel or reschedule your appointment, please contact [facility] [place] - A DEPT OF Eligha Bridegroom [facility] [phone]  and follow the prompts.  Office hours are 8:00 a.m. to 4:30 p.m. Monday - Friday. Please note that voicemails left after 4:00 p.m. may not be returned until the following business day.  We are closed weekends and major holidays. You have access to a nurse at all times for urgent questions. Please call the main number to the clinic [phone] and follow the prompts.  For any non-urgent questions, you may also contact your provider using MyChart. We now offer e-Visits for anyone 41 and older to request care online for non-urgent symptoms. For details visit [email or web address].   Also download the MyChart app! Go to the app store, search "MyChart", open the app, select [place], and log in with your MyChart username and password.

## 2024-02-14 NOTE — Progress Notes (Signed)
 " Patient Care Team: [name], DO as PCP - General (Family Medicine) Lonni Slain, MD as PCP - Cardiology (Cardiology) [facility] (Ophthalmology) Ladora, Ross Lacy Cohn, MD as Referring Physician (Optometry) Armbruster, Elspeth SQUIBB, MD as Consulting Physician (Gastroenterology) Davonna Siad, MD as Consulting Physician (Oncology)  Clinic Day:  02/14/2024  Referring physician: [name], DO   CHIEF COMPLAINT:  CC: Non-small cell lung carcinoma  ASSESSMENT & PLAN:   Assessment & Plan: Heather [name]  is a 69 y.o. 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 PET scan showed no distant metastatic disease MRI brain negative for brain metastasis  Discussed at tumor board today.  Considering the extent of the disease and bilateral nature of, chemoRT is not an option for the patient.  Recommended systemic chemoimmunotherapy Patient had port placed Caris: No targetable mutations, PD-L1: 0 Guardant360: ERBB2 amplification present, CHEK2 present, other alterations: TP 53 and RB1 Started on carboplatin  plus paclitaxel  plus ipilimumab  plus nivolumab  on 09/20/2023   -C4D22 today. Patient tolerated previous cycles well.  She completed chemotherapy at this time. -Labs reviewed today: CMP: WNL, CBC: WBC: WBC:16.0, hemoglobin: 12.5, platelets: 337, recent TSH: 4.980, T4: 7.4 -Physical exam stable. Proceed with the immunotherapy today. -Repeat PET scan in 3 months. Due 03/2024   Return to clinic in 6 weeks for follow up  Linea IgA bullous Dermatosis Patient had bullous vesicular rash on abdomen and back. 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  Patient is adequately hydrated. Also uses biotin spray  - Continue cevimeline  30 mg to be taken 3 times daily - If no improvement, will check for Sjogren's syndrome  Anxiety Significant anxiety exacerbated by cancer diagnosis and personal losses.  Buspar , is inadequate.   - Continue Ambien  for sleep disturbances related to anxiety.  Tobacco use 40-year smoking history with recent cessation attempt.  Patient quit smoking on 08/08/2023  - Encouraged attempts to abstain from smoking  The patient understands the plans discussed today and is in agreement with them.  She knows to contact our office if she develops concerns prior to her next appointment.  The total time spent in the appointment was 17 minutes for the encounter with patient, including review of chart and various tests results, discussions about plan of care and coordination of care plan   Siad Davonna, MD  [facility] [place] - A DEPT OF [facility] [address] [place] [address] Dept: [phone] Dept Fax: [phone]    ONCOLOGY HISTORY:   Diagnosis: Right lung squamous cell carcinoma  -06/26/2023: CT lung cancer screening: Lung-RADS 4B, suspicious. Irregular spiculated partially cavitary solid 3.6 cm peripheral apical right upper lobe lung mass, highly suspicious for primary bronchogenic carcinoma. At least 7 additional solid pulmonary nodules scattered in the right greater than left lungs, largest 8.6 mm in the posterior right lower  lobe, and including  a cavitary 8 mm left lower lobe nodule, suspicious for bilateral pulmonary metastases. Mildly enlarged right paratracheal lymph node, suspicious for nodal metastasis. -07/19/2023: Initial PET: Dominant right apical cavitary lesion is diffusely hypermetabolic consistent with area of malignancy. There are several metastatic nodes identified involving the right hilum, thoracic inlet, supraclavicular region as well as along the mediastinum, right greater than left. There are a few other small lung nodules bilaterally which have low-level uptake. These have a differential and could represent other areas of neoplastic disease versus infectious/inflammatory. -08/07/2023: MRI Brain: NED -08/13/2023: Bronchoscopy with RUL and lymph node (4R and 7) FNA's.  Pathology: Non-small cell carcinoma, favor squamous cell carcinoma. Lymph nodes positive for metastatic carcinoma (2/2).  Caris NGS: ALK, BRAF, EGFR, KRAS, MET, RET, ROS1: Negative -RB1, TP53: Positive, MSI-Stable, PDL1: Negative, 0%, TMB: low FoundationOne: CHEK2, SOX2 amplification, ASXL1, RB1,TP53 -08/23/2023: Gaurdant 360: ERBB2 amplification present, CHEK2 present, other alterations: TP 53 and RB1  -09/20/2023-10/10/2023: Carboplatin , ipilimumab , nivolumab ,  and paclitaxel   -10/31/2023- Current: Maintenance Ipilimumab  and Nivolumab  -11/15/2023: PET: Mixed response of mediastinal and hilar adenopathy with partial improvement but some new/enlarging hypermetabolic nodes, including a new right eccentric subcarinal node and a new right infrahilar node. Improved right upper lobe mass. New hypermetabolic hazy subsolid and ground-glass pulmonary opacities in both lungs demonstrating moderate accentuated metabolic activity, which may reflect therapy-related change, atypical infection, or lymphangitic spread. Mildly increased metabolic activity in small portacaval lymph nodes; indeterminate and may warrant surveillance. -12/27/2023: PET: Further  decrease in size of right upper lobe primary. Improvement and resolution in hypermetabolic thoracic nodes which are nonspecific. Progressive diffuse pulmonary parenchymal hypermetabolism in the setting of increased ground glass and architectural distortion. Favor immunotherapy induced pneumonitis and possible developing fibrosis. No new or progressive disease. Hypermetabolism about the vaginal introitus is favored to be due to urinary contamination.   Current Treatment: Ipilimumab  +nivolumab   INTERVAL HISTORY:  Discussed the use of AI scribe software for clinical note transcription with the patient, who gave verbal consent to proceed.  History of Present Illness Heather [name] is a 69 year old female with malignancy receiving immunotherapy who presents for follow-up and infusion today. She is accompanied by her husband today.   She experiences a recurrent cutaneous eruption that flares with each immunotherapy cycle, most pronounced in the mornings and with minimal contact. The rash was most severe during the initial two to three weeks of therapy but is now tolerable, with minimal nocturnal pruritus. Symptoms are managed with high-potency topical corticosteroids applied up to three times daily and oral prednisone , resulting in improvement. She is able to maintain her daily activities and finds the symptoms manageable with the current regimen.  She continues immunotherapy and prefers to maintain the current regimen as long as symptoms remain controlled. She demonstrates understanding of the complexity of immunotherapy and is satisfied with her disease management. Her last imaging was performed in mid-December, with the next scan scheduled for March. She notes difficulty recalling details of her treatment schedule, attributing this to cognitive changes associated with chemotherapy.  She takes oral prednisone  in the mornings and has noted increased appetite, which she attributes to steroid use. She  experienced a single episode of nausea the previous day, with intolerance to food except dry crackers, but did not vomit and has had no ongoing gastrointestinal symptoms. She inquires about the effects of steroids on blood glucose and thyroid  function and is reassured that her blood glucose is normal. Laboratory studies show a mildly elevated white blood cell count, and the  patient was informed that this may be due to steroid use.   She has maintained smoking cessation for approximately seven months and expresses satisfaction with this achievement. She also notes hair regrowth following chemotherapy, with changes in color and texture, and describes her efforts to maintain its appearance.    I have reviewed the past medical history, past surgical history, social history and family history with the patient and they are unchanged from previous note.  ALLERGIES:  has no known allergies.  MEDICATIONS:  Current Outpatient Medications  Medication Sig Dispense Refill   triamcinolone  ointment (KENALOG ) 0.1 % Apply topically 2 (two) times daily.     Ascorbic Acid (VITAMIN C) 100 MG tablet Take 100 mg by mouth daily.     aspirin  EC 81 MG tablet Take 1 tablet (81 mg total) by mouth daily. Swallow whole.     atorvastatin  (LIPITOR) 40 MG tablet Take 1 tablet (40 mg total) by mouth daily. 100 tablet 3   busPIRone  (BUSPAR ) 30 MG tablet Take 1 tablet (30 mg total) by mouth in the morning and at bedtime. 200 tablet 3   cevimeline  (EVOXAC ) 30 MG capsule Take 1 capsule (30 mg total) by mouth 3 (three) times daily. 90 capsule 0   Cholecalciferol (VITAMIN D -3) 25 MCG (1000 UT) CAPS Take by mouth.     clobetasol cream (TEMOVATE) 0.05 % Apply topically.     dexamethasone  (DECADRON ) 4 MG tablet Take 2 tablets (8mg ) by mouth daily starting the day after carboplatin  for 3 days. Take with food 30 tablet 1   diclofenac  Sodium (VOLTAREN ) 1 % GEL Apply 1 Application topically as needed (pain).     diphenhydrAMINE  (BENADRYL )  25 MG tablet Take 1 tablet (25 mg total) by mouth every 6 (six) hours for 3 days. 12 tablet 0   famotidine  (PEPCID ) 20 MG tablet Take 1 tablet (20 mg total) by mouth 2 (two) times daily for 3 days. 6 tablet 0   FLUoxetine  (PROZAC ) 40 MG capsule Take 1 capsule (40 mg total) by mouth daily. 100 capsule 3   levocetirizine (XYZAL ) 5 MG tablet Take 0.5-1 tablets (2.5-5 mg total) by mouth every evening. To replace benadryl  for itching 90 tablet 3   levothyroxine  (SYNTHROID ) 112 MCG tablet Take 1 tablet (112 mcg total) by mouth daily before breakfast. 100 tablet 3   lidocaine  (XYLOCAINE ) 2 % solution      magic mouthwash w/lidocaine  SOLN Take 5 mLs by mouth 4 (four) times daily as needed for mouth pain. Suspension contains equal amounts of Maalox Extra Strength, nystatin, diphenhydramine  and lidocaine . 250 mL 0   omeprazole  (PRILOSEC) 40 MG capsule Take 1 capsule (40 mg total) by mouth daily. (Patient taking differently: Take 40 mg by mouth daily. Hasn't been needing this recently) 100 capsule 3   ondansetron  (ZOFRAN ) 8 MG tablet Take 1 tablet (8 mg total) by mouth every 8 (eight) hours as needed for nausea or vomiting. Start on the third day after carboplatin . 30 tablet 1   predniSONE  (DELTASONE ) 10 MG tablet Take 1 tablet by mouth once daily with breakfast 30 tablet 0   prochlorperazine  (COMPAZINE ) 10 MG tablet Take 1 tablet (10 mg total) by mouth every 6 (six) hours as needed for nausea or vomiting. 30 tablet 1   valACYclovir  (VALTREX ) 1000 MG tablet TAKE 2 TABLETS BY MOUTH TWICE DAILY FOR 1 DAY AT FEVER BLISTER ONSET 20 tablet 2   No current facility-administered medications for this visit.    VITALS:  There were no  vitals taken for this visit.  Wt Readings from Last 3 Encounters:  02/14/24 166 lb 3.6 oz (75.4 kg)  01/24/24 166 lb 0.1 oz (75.3 kg)  01/03/24 163 lb 12.8 oz (74.3 kg)    There is no height or weight on file to calculate BMI.  Performance status (ECOG): 1 - Symptomatic but  completely ambulatory  PHYSICAL EXAM:   GENERAL:alert, no distress and comfortable SKIN: Erythematous papular rash on abdomen and back (pictures below) EYES: normal, Conjunctiva are pink and non-injected, sclera clear LUNGS: clear to auscultation and percussion with normal breathing effort HEART: regular rate & rhythm and no murmurs and no lower extremity edema ABDOMEN:abdomen soft, non-tender and normal bowel sounds Musculoskeletal:no cyanosis of digits and no clubbing  NEURO: alert & oriented x 3 with fluent speech       LABORATORY DATA:  I have reviewed the data as listed    Component Value Date/Time   NA 137 02/14/2024 1220   NA 138 06/15/2023 0934   K 4.2 02/14/2024 1220   CL 101 02/14/2024 1220   CO2 23 02/14/2024 1220   GLUCOSE 114 (H) 02/14/2024 1220   BUN 16 02/14/2024 1220   BUN 11 06/15/2023 0934   CREATININE 0.67 02/14/2024 1220   CALCIUM  9.8 02/14/2024 1220   PROT 7.2 02/14/2024 1220   PROT 6.9 06/15/2023 0934   ALBUMIN 4.4 02/14/2024 1220   ALBUMIN 4.4 06/15/2023 0934   AST 23 02/14/2024 1220   ALT 26 02/14/2024 1220   ALKPHOS 67 02/14/2024 1220   BILITOT 0.5 02/14/2024 1220   BILITOT 0.4 06/15/2023 0934   GFRNONAA >60 02/14/2024 1220   GFRAA 105 01/21/2020 1615    Lab Results  Component Value Date   WBC 16.0 (H) 02/14/2024   NEUTROABS 14.0 (H) 02/14/2024   HGB 12.5 02/14/2024   HCT 38.3 02/14/2024   MCV 89.3 02/14/2024   PLT 337 02/14/2024      Chemistry      Component Value Date/Time   NA 137 02/14/2024 1220   NA 138 06/15/2023 0934   K 4.2 02/14/2024 1220   CL 101 02/14/2024 1220   CO2 23 02/14/2024 1220   BUN 16 02/14/2024 1220   BUN 11 06/15/2023 0934   CREATININE 0.67 02/14/2024 1220      Component Value Date/Time   CALCIUM  9.8 02/14/2024 1220   ALKPHOS 67 02/14/2024 1220   AST 23 02/14/2024 1220   ALT 26 02/14/2024 1220   BILITOT 0.5 02/14/2024 1220   BILITOT 0.4 06/15/2023 0934      Latest Reference Range & Units  01/24/24 09:31  TSH 0.350 - 4.500 uIU/mL 4.980 (H)  Thyroxine (T4) 4.5 - 12.0 ug/dL 7.4  (H): Data is abnormally high   RADIOGRAPHIC STUDIES: I have personally reviewed the radiological images as listed and agreed with the findings in the report. "

## 2024-02-14 NOTE — Progress Notes (Signed)
Treatment given per orders. Patient tolerated it well without problems. Vitals stable and discharged home from clinic ambulatory. Follow up as scheduled.  

## 2024-02-16 ENCOUNTER — Encounter: Payer: Self-pay | Admitting: Oncology

## 2024-03-06 ENCOUNTER — Inpatient Hospital Stay

## 2024-03-06 ENCOUNTER — Inpatient Hospital Stay: Attending: Oncology

## 2024-03-06 ENCOUNTER — Inpatient Hospital Stay: Admitting: Oncology

## 2024-03-10 ENCOUNTER — Inpatient Hospital Stay: Admitting: Dietician

## 2024-03-27 ENCOUNTER — Inpatient Hospital Stay

## 2024-03-27 ENCOUNTER — Inpatient Hospital Stay: Attending: Oncology

## 2024-03-27 ENCOUNTER — Inpatient Hospital Stay: Admitting: Oncology

## 2024-06-16 ENCOUNTER — Ambulatory Visit: Admitting: Family Medicine

## 2024-06-17 ENCOUNTER — Ambulatory Visit: Admitting: Family Medicine

## 2024-10-23 ENCOUNTER — Ambulatory Visit: Payer: Self-pay
